# Patient Record
Sex: Male | Born: 1985 | Race: White | Hispanic: No | Marital: Single | Smoking: Former smoker
Health system: Southern US, Community
[De-identification: ages and names within clinical notes are randomized; demographics above are authoritative.]

## PROBLEM LIST (undated history)

## (undated) DIAGNOSIS — Y249XXA Unspecified firearm discharge, undetermined intent, initial encounter: Secondary | ICD-10-CM

## (undated) DIAGNOSIS — K5780 Diverticulitis of intestine, part unspecified, with perforation and abscess without bleeding: Secondary | ICD-10-CM

## (undated) DIAGNOSIS — I10 Essential (primary) hypertension: Secondary | ICD-10-CM

## (undated) DIAGNOSIS — K56609 Unspecified intestinal obstruction, unspecified as to partial versus complete obstruction: Secondary | ICD-10-CM

## (undated) DIAGNOSIS — K5792 Diverticulitis of intestine, part unspecified, without perforation or abscess without bleeding: Secondary | ICD-10-CM

## (undated) DIAGNOSIS — W3400XA Accidental discharge from unspecified firearms or gun, initial encounter: Secondary | ICD-10-CM

## (undated) DIAGNOSIS — K5720 Diverticulitis of large intestine with perforation and abscess without bleeding: Secondary | ICD-10-CM

## (undated) HISTORY — DX: Unspecified intestinal obstruction, unspecified as to partial versus complete obstruction: K56.609

## (undated) HISTORY — DX: Diverticulitis of intestine, part unspecified, with perforation and abscess without bleeding: K57.80

## (undated) HISTORY — DX: Diverticulitis of intestine, part unspecified, without perforation or abscess without bleeding: K57.92

## (undated) HISTORY — PX: COLON SURGERY: SHX602

---

## 2012-06-07 ENCOUNTER — Emergency Department (HOSPITAL_BASED_OUTPATIENT_CLINIC_OR_DEPARTMENT_OTHER)
Admission: EM | Admit: 2012-06-07 | Discharge: 2012-06-07 | Disposition: A | Discharge status: Home / Self Care | Payer: Self-pay | Attending: Emergency Medicine | Admitting: Emergency Medicine

## 2012-06-07 ENCOUNTER — Emergency Department (HOSPITAL_BASED_OUTPATIENT_CLINIC_OR_DEPARTMENT_OTHER): Payer: Self-pay

## 2012-06-07 ENCOUNTER — Encounter (HOSPITAL_BASED_OUTPATIENT_CLINIC_OR_DEPARTMENT_OTHER): Payer: Self-pay | Admitting: *Deleted

## 2012-06-07 DIAGNOSIS — D179 Benign lipomatous neoplasm, unspecified: Secondary | ICD-10-CM

## 2012-06-07 DIAGNOSIS — D1739 Benign lipomatous neoplasm of skin and subcutaneous tissue of other sites: Secondary | ICD-10-CM | POA: Insufficient documentation

## 2012-06-07 DIAGNOSIS — F17200 Nicotine dependence, unspecified, uncomplicated: Secondary | ICD-10-CM | POA: Insufficient documentation

## 2012-06-07 MED ORDER — HYDROCODONE-ACETAMINOPHEN 5-325 MG PO TABS: 1.0000 | ORAL_TABLET | ORAL | Status: DC | PRN

## 2012-06-07 NOTE — ED Provider Notes (Signed)
History     CSN: [id number]  Arrival date & time 06/07/12  1143   First MD Initiated Contact with Patient 06/07/12 1318      Chief Complaint  Patient presents with  . Mass    (Consider location/radiation/quality/duration/timing/severity/associated sxs/prior treatment) HPI Comments: Pt state that he has a bump in his right upper leg and he has had it for 4 years:but states that he has been having pain with the area and it is more sore with standing:pt denies increase in size or redness to the area:pt states that tylenol doesn't work:pt states that it hurts in his hip  The history is provided by the patient. No language interpreter was used.    History reviewed. No pertinent past medical history.  History reviewed. No pertinent past surgical history.  History reviewed. No pertinent family history.  History  Substance Use Topics  . Smoking status: Current Every Day Smoker  . Smokeless tobacco: Not on file  . Alcohol Use: Not on file      Review of Systems  Constitutional: Negative.   Respiratory: Negative.   Cardiovascular: Negative.     Allergies  Review of patient's allergies indicates no known allergies.  Home Medications   Current Outpatient Rx  Name  Route  Sig  Dispense  Refill  . HYDROcodone-acetaminophen (NORCO/VICODIN) 5-325 MG per tablet   Oral   Take 1 tablet by mouth every 4 (four) hours as needed for pain.   10 tablet   0     BP 153/87  Pulse 92  Temp(Src) 98.4 F (36.9 C) (Oral)  Resp 16  Ht 6' (1.829 m)  Wt 160 lb (72.576 kg)  BMI 21.7 kg/m2  SpO2 100%  Physical Exam  Nursing note and vitals reviewed. Constitutional: He is oriented to person, place, and time. He appears well-developed and well-nourished.  Cardiovascular: Normal rate and regular rhythm.   Pulmonary/Chest: Effort normal and breath sounds normal.  Neurological: He is alert and oriented to person, place, and time.  Skin:  Pt has fluctuant area to the right upper leg:no  redness noted to the area:pt has full rom    ED Course  Procedures (including critical care time)  Labs Reviewed - No data to display Dg Hip Complete Right  06/07/2012  *RADIOLOGY REPORT*  Clinical Data: Soft tissue fullness right groin region  RIGHT HIP - COMPLETE 2+ VIEW  Comparison: None.  Findings:  Frontal pelvis as well as frontal and lateral right hip images were obtained.  There is a sclerotic area in the medial intertrochanteric region measuring 3.4 x 2.0 cm, probably a bone island.  No fracture or dislocation.  Joint spaces appear intact. No erosive change. No bony exostosis or abnormal periosteal reaction.  No soft tissue mass seen.  IMPRESSION: Probable bone island proximal femur on the right.  No fracture or dislocation.  No evidence of soft tissue mass or abnormal periosteal reaction.  If there remains concern for mass in this area, CT or MR could be helpful to further assess.   Original Report Authenticated By: Bretta Bang, M.D.      1. Lipoma       MDM  Fluctuant without redness:no along the lymph chain       Teressa Lower, NP 06/07/12 1755

## 2012-06-07 NOTE — ED Notes (Signed)
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

## 2012-06-08 NOTE — ED Provider Notes (Signed)
Medical screening examination/treatment/procedure(s) were performed by non-physician practitioner and as supervising physician I was immediately available for consultation/collaboration.   Charles B. Bernette Mayers, MD 06/08/12 [phone]

## 2012-06-08 NOTE — ED Notes (Signed)
Pt called stating [facility] will not remove lipoma. Pt was given phone number to [facility]. Pt requesting additional pain med and told no refills can be called in for narcotics.

## 2013-06-01 ENCOUNTER — Emergency Department (HOSPITAL_BASED_OUTPATIENT_CLINIC_OR_DEPARTMENT_OTHER): Payer: Self-pay

## 2013-06-01 ENCOUNTER — Encounter (HOSPITAL_BASED_OUTPATIENT_CLINIC_OR_DEPARTMENT_OTHER): Payer: Self-pay | Admitting: Emergency Medicine

## 2013-06-01 ENCOUNTER — Emergency Department (HOSPITAL_BASED_OUTPATIENT_CLINIC_OR_DEPARTMENT_OTHER)
Admission: EM | Admit: 2013-06-01 | Discharge: 2013-06-01 | Disposition: A | Discharge status: Home / Self Care | Payer: Self-pay | Attending: Emergency Medicine | Admitting: Emergency Medicine

## 2013-06-01 DIAGNOSIS — Y92410 Unspecified street and highway as the place of occurrence of the external cause: Secondary | ICD-10-CM | POA: Insufficient documentation

## 2013-06-01 DIAGNOSIS — IMO0002 Reserved for concepts with insufficient information to code with codable children: Secondary | ICD-10-CM | POA: Insufficient documentation

## 2013-06-01 DIAGNOSIS — S5002XA Contusion of left elbow, initial encounter: Secondary | ICD-10-CM

## 2013-06-01 DIAGNOSIS — Z8719 Personal history of other diseases of the digestive system: Secondary | ICD-10-CM | POA: Insufficient documentation

## 2013-06-01 DIAGNOSIS — F17200 Nicotine dependence, unspecified, uncomplicated: Secondary | ICD-10-CM | POA: Insufficient documentation

## 2013-06-01 DIAGNOSIS — S5000XA Contusion of unspecified elbow, initial encounter: Secondary | ICD-10-CM | POA: Insufficient documentation

## 2013-06-01 DIAGNOSIS — Y9389 Activity, other specified: Secondary | ICD-10-CM | POA: Insufficient documentation

## 2013-06-01 HISTORY — DX: Unspecified firearm discharge, undetermined intent, initial encounter: Y24.9XXA

## 2013-06-01 HISTORY — DX: Accidental discharge from unspecified firearms or gun, initial encounter: W34.00XA

## 2013-06-01 HISTORY — DX: Diverticulitis of large intestine with perforation and abscess without bleeding: K57.20

## 2013-06-01 MED ORDER — HYDROCODONE-ACETAMINOPHEN 5-325 MG PO TABS
2.0000 | ORAL_TABLET | Freq: Once | ORAL | Status: AC
  Administered 2013-06-01: 2 via ORAL
  Filled 2013-06-01: qty 2

## 2013-06-01 MED ORDER — HYDROCODONE-ACETAMINOPHEN 5-325 MG PO TABS: 2.0000 | ORAL_TABLET | ORAL | Status: DC | PRN
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
## 2013-06-01 NOTE — ED Provider Notes (Signed)
Medical screening examination/treatment/procedure(s) were performed by non-physician practitioner and as supervising physician I was immediately available for consultation/collaboration.   EKG Interpretation None        Khayla Koppenhaver, MD 06/01/13 2345 

## 2013-06-01 NOTE — Discharge Instructions (Signed)
Elbow Contusion °An elbow contusion is a deep bruise of the elbow. Contusions are the result of an injury that caused bleeding under the skin. The contusion may turn blue, purple, or yellow. Minor injuries will give you a painless contusion, but more severe contusions may stay painful and swollen for a few weeks.  °CAUSES  °An elbow contusion comes from a direct force to that area, such as falling on the elbow. °SYMPTOMS  °· Swelling and redness of the elbow. °· Bruising of the elbow area. °· Tenderness or soreness of the elbow. °DIAGNOSIS  °You will have a physical exam and will be asked about your history. You may need an X-ray of your elbow to look for a broken bone (fracture).  °TREATMENT  °A sling or splint may be needed to support your injury. Resting, elevating, and applying cold compresses to the elbow area are often the best treatments for an elbow contusion. Over-the-counter medicines may also be recommended for pain control. °HOME CARE INSTRUCTIONS  °· Put ice on the injured area. °· Put ice in a plastic bag. °· Place a towel between your skin and the bag. °· Leave the ice on for 15-20 minutes, 03-04 times a day. °· Only take over-the-counter or prescription medicines for pain, discomfort, or fever as directed by your caregiver. °· Rest your injured elbow until the pain and swelling are better. °· Elevate your elbow to reduce swelling. °· Apply a compression wrap as directed by your caregiver. This can help reduce swelling and motion. You may remove the wrap for sleeping, showers, and baths. If your fingers become numb, cold, or blue, take the wrap off and reapply it more loosely. °· Use your elbow only as directed by your caregiver. You may be asked to do range of motion exercises. Do them as directed. °· See your caregiver as directed. It is very important to keep all follow-up appointments in order to avoid any long-term problems with your elbow, including chronic pain or inability to move your elbow  normally. °SEEK IMMEDIATE MEDICAL CARE IF:  °· You have increased redness, swelling, or pain in your elbow. °· Your swelling or pain is not relieved with medicines. °· You have swelling of the hand and fingers. °· You are unable to move your fingers or wrist. °· You begin to lose feeling in your hand or fingers. °· Your fingers or hand become cold or blue. °MAKE SURE YOU:  °· Understand these instructions. °· Will watch your condition. °· Will get help right away if you are not doing well or get worse. °Document Released: 01/09/2006 Document Revised: 04/25/2011 Document Reviewed: 12/17/2010 °Exit[employer]® Patient Information ©2014 [employer]. ° °

## 2014-05-26 ENCOUNTER — Emergency Department
Admit: 2014-05-26 | Disposition: A | Discharge status: Home / Self Care | Payer: Self-pay | Admitting: Emergency Medicine

## 2014-06-13 ENCOUNTER — Emergency Department
Admit: 2014-06-13 | Disposition: A | Discharge status: Home / Self Care | Payer: Self-pay | Admitting: Emergency Medicine

## 2014-06-13 LAB — COMPREHENSIVE METABOLIC PANEL
ALT: 21 U/L
ANION GAP: 9 (ref 7–16)
Albumin: 4.6 g/dL
Alkaline Phosphatase: 56 U/L
BUN: 10 mg/dL
Bilirubin,Total: 0.4 mg/dL
CALCIUM: 8.4 mg/dL — AB
CO2: 24 mmol/L
Chloride: 105 mmol/L
Creatinine: 0.73 mg/dL
EGFR (African American): >60
EGFR (Non-African Amer.): >60
Glucose: 91 mg/dL
POTASSIUM: 3.7 mmol/L
SGOT(AST): 42 U/L — ABNORMAL HIGH
SODIUM: 138 mmol/L
Total Protein: 7.8 g/dL

## 2014-06-13 LAB — DRUG SCREEN, URINE
AMPHETAMINES, UR SCREEN: NEGATIVE
BARBITURATES, UR SCREEN: NEGATIVE
BENZODIAZEPINE, UR SCRN: NEGATIVE
Cannabinoid 50 Ng, Ur ~~LOC~~: POSITIVE
Cocaine Metabolite,Ur ~~LOC~~: NEGATIVE
MDMA (Ecstasy)Ur Screen: NEGATIVE
METHADONE, UR SCREEN: NEGATIVE
Opiate, Ur Screen: POSITIVE
PHENCYCLIDINE (PCP) UR S: NEGATIVE
TRICYCLIC, UR SCREEN: NEGATIVE

## 2014-06-13 LAB — URINALYSIS, COMPLETE
Bacteria: NONE SEEN
Bilirubin,UR: NEGATIVE
Blood: NEGATIVE
Glucose,UR: 150 mg/dL (ref 0–75)
Ketone: NEGATIVE
LEUKOCYTE ESTERASE: NEGATIVE
NITRITE: NEGATIVE
PH: 6 (ref 4.5–8.0)
Protein: 30
Specific Gravity: 1.006 (ref 1.003–1.030)
Squamous Epithelial: NONE SEEN

## 2014-06-13 LAB — CBC
HCT: 42.1 % (ref 40.0–52.0)
HGB: 14.4 g/dL (ref 13.0–18.0)
MCH: 30.7 pg (ref 26.0–34.0)
MCHC: 34.2 g/dL (ref 32.0–36.0)
MCV: 90 fL (ref 80–100)
Platelet: 302 10*3/uL (ref 150–440)
RBC: 4.69 10*6/uL (ref 4.40–5.90)
RDW: 12.7 % (ref 11.5–14.5)
WBC: 13.7 10*3/uL — AB (ref 3.8–10.6)

## 2014-06-13 LAB — SALICYLATE LEVEL: Salicylates, Serum: <4 mg/dL

## 2014-06-13 LAB — TSH: THYROID STIMULATING HORM: 3.786 u[IU]/mL

## 2014-06-13 LAB — ACETAMINOPHEN LEVEL: Acetaminophen: <10 ug/mL

## 2014-06-13 LAB — ETHANOL: Ethanol: 83 mg/dL

## 2017-02-16 ENCOUNTER — Inpatient Hospital Stay
Admission: EM | Admit: 2017-02-16 | Discharge: 2017-02-16 | DRG: 392 | Disposition: A | Discharge status: Home / Self Care | Payer: Medicaid Other | Attending: Specialist | Admitting: Specialist

## 2017-02-16 ENCOUNTER — Emergency Department: Payer: Medicaid Other

## 2017-02-16 ENCOUNTER — Other Ambulatory Visit: Payer: Self-pay

## 2017-02-16 DIAGNOSIS — N179 Acute kidney failure, unspecified: Secondary | ICD-10-CM | POA: Diagnosis present

## 2017-02-16 DIAGNOSIS — E860 Dehydration: Secondary | ICD-10-CM | POA: Diagnosis present

## 2017-02-16 DIAGNOSIS — Z79899 Other long term (current) drug therapy: Secondary | ICD-10-CM | POA: Diagnosis not present

## 2017-02-16 DIAGNOSIS — K5792 Diverticulitis of intestine, part unspecified, without perforation or abscess without bleeding: Secondary | ICD-10-CM | POA: Diagnosis not present

## 2017-02-16 DIAGNOSIS — Z885 Allergy status to narcotic agent status: Secondary | ICD-10-CM

## 2017-02-16 DIAGNOSIS — D72829 Elevated white blood cell count, unspecified: Secondary | ICD-10-CM | POA: Diagnosis present

## 2017-02-16 DIAGNOSIS — R52 Pain, unspecified: Secondary | ICD-10-CM

## 2017-02-16 DIAGNOSIS — F17210 Nicotine dependence, cigarettes, uncomplicated: Secondary | ICD-10-CM | POA: Diagnosis present

## 2017-02-16 LAB — URINALYSIS, COMPLETE (UACMP) WITH MICROSCOPIC
Bacteria, UA: NONE SEEN
Bilirubin Urine: NEGATIVE
GLUCOSE, UA: 150 mg/dL — AB
Ketones, ur: NEGATIVE mg/dL
NITRITE: NEGATIVE
PH: 5 (ref 5.0–8.0)
Protein, ur: 100 mg/dL — AB
Specific Gravity, Urine: 1.026 (ref 1.005–1.030)

## 2017-02-16 LAB — COMPREHENSIVE METABOLIC PANEL
ALBUMIN: 4.8 g/dL (ref 3.5–5.0)
ALK PHOS: 54 U/L (ref 38–126)
ALT: 22 U/L (ref 17–63)
ANION GAP: 10 (ref 5–15)
AST: 20 U/L (ref 15–41)
BILIRUBIN TOTAL: 0.9 mg/dL (ref 0.3–1.2)
BUN: 15 mg/dL (ref 6–20)
CALCIUM: 9.8 mg/dL (ref 8.9–10.3)
CO2: 24 mmol/L (ref 22–32)
Chloride: 99 mmol/L — ABNORMAL LOW (ref 101–111)
Creatinine, Ser: 1.27 mg/dL — ABNORMAL HIGH (ref 0.61–1.24)
GFR calc Af Amer: >60 mL/min (ref >60)
GLUCOSE: 191 mg/dL — AB (ref 65–99)
POTASSIUM: 4.4 mmol/L (ref 3.5–5.1)
Sodium: 133 mmol/L — ABNORMAL LOW (ref 135–145)
TOTAL PROTEIN: 9 g/dL — AB (ref 6.5–8.1)

## 2017-02-16 LAB — CBC
HCT: 48.6 % (ref 40.0–52.0)
HEMOGLOBIN: 16.5 g/dL (ref 13.0–18.0)
MCH: 31.3 pg (ref 26.0–34.0)
MCHC: 34 g/dL (ref 32.0–36.0)
MCV: 91.9 fL (ref 80.0–100.0)
Platelets: 303 10*3/uL (ref 150–440)
RBC: 5.29 MIL/uL (ref 4.40–5.90)
RDW: 13.2 % (ref 11.5–14.5)
WBC: 24 10*3/uL — AB (ref 3.8–10.6)

## 2017-02-16 LAB — LIPASE, BLOOD: Lipase: 25 U/L (ref 11–51)

## 2017-02-16 MED ORDER — CIPROFLOXACIN IN D5W 400 MG/200ML IV SOLN
400.0000 mg | Freq: Once | INTRAVENOUS | Status: AC
  Administered 2017-02-16: 400 mg via INTRAVENOUS
  Filled 2017-02-16: qty 200

## 2017-02-16 MED ORDER — OXYCODONE-ACETAMINOPHEN 5-325 MG PO TABS
1.0000 | ORAL_TABLET | ORAL | Status: DC | PRN
  Administered 2017-02-16: 1 via ORAL
  Filled 2017-02-16: qty 1

## 2017-02-16 MED ORDER — ONDANSETRON HCL 4 MG/2ML IJ SOLN
4.0000 mg | Freq: Once | INTRAMUSCULAR | Status: AC
  Administered 2017-02-16: 4 mg via INTRAVENOUS
  Filled 2017-02-16: qty 2

## 2017-02-16 MED ORDER — IOPAMIDOL (ISOVUE-300) INJECTION 61%
100.0000 mL | Freq: Once | INTRAVENOUS | Status: AC | PRN
  Administered 2017-02-16: 100 mL via INTRAVENOUS
  Filled 2017-02-16: qty 100

## 2017-02-16 MED ORDER — METRONIDAZOLE IN NACL 5-0.79 MG/ML-% IV SOLN
500.0000 mg | Freq: Once | INTRAVENOUS | Status: AC
  Administered 2017-02-16: 500 mg via INTRAVENOUS
  Filled 2017-02-16: qty 100

## 2017-02-16 MED ORDER — OXYCODONE-ACETAMINOPHEN 5-325 MG PO TABS: 1.0000 | ORAL_TABLET | ORAL | 0 refills | Status: DC | PRN

## 2017-02-16 MED ORDER — CIPROFLOXACIN HCL 500 MG PO TABS: 500.0000 mg | ORAL_TABLET | Freq: Two times a day (BID) | ORAL | 0 refills | Status: DC

## 2017-02-16 MED ORDER — MORPHINE SULFATE (PF) 4 MG/ML IV SOLN
4.0000 mg | INTRAVENOUS | Status: AC | PRN
  Administered 2017-02-16 (×2): 4 mg via INTRAVENOUS
  Filled 2017-02-16 (×2): qty 1

## 2017-02-16 MED ORDER — CIPROFLOXACIN IN D5W 400 MG/200ML IV SOLN
INTRAVENOUS | Status: AC
  Filled 2017-02-16: qty 200

## 2017-02-16 MED ORDER — METRONIDAZOLE IN NACL 5-0.79 MG/ML-% IV SOLN
INTRAVENOUS | Status: AC
  Filled 2017-02-16: qty 100

## 2017-02-16 MED ORDER — SODIUM CHLORIDE 0.9 % IV BOLUS (SEPSIS)
1000.0000 mL | Freq: Once | INTRAVENOUS | Status: AC
  Administered 2017-02-16: 1000 mL via INTRAVENOUS

## 2017-02-16 MED ORDER — METRONIDAZOLE 500 MG PO TABS: 500.0000 mg | ORAL_TABLET | Freq: Two times a day (BID) | ORAL | 0 refills | Status: DC

## 2017-02-16 MED ORDER — IOPAMIDOL (ISOVUE-300) INJECTION 61%
30.0000 mL | Freq: Once | INTRAVENOUS | Status: AC | PRN
  Administered 2017-02-16: 30 mL via ORAL
  Filled 2017-02-16: qty 30

## 2017-02-16 NOTE — ED Triage Notes (Signed)
Pt states lower abdominal pain all the way across since yesterday. Also c/o R sided groin pain. States sharp. States hx of kidney stones. States pain worse when using the bathroom. Nausea last night. Denies vomiting. States diarrhea x 2-3 episodes. Still has appendix and gallbladder. Alert, oriented, ambulatory.
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
## 2017-02-16 NOTE — Discharge Instructions (Signed)
You are evaluated for abdominal pain and found to have diverticulitis which is being treated with antibiotics.  You are also prescribed narcotic pain medication, you may also take over-the-counter ibuprofen as needed for mild to moderate pain.  Do not mix narcotics with any other sedative medications or substances including alcohol.  Return to emergency department immediately for any worsening condition including uncontrolled pain, fever, nausea and vomiting and unable to keep her medications down, or any other symptoms concerning to you.  I do recommend you follow-up with primary care doctor, and [facility] clinic number is provided.

## 2017-02-16 NOTE — ED Notes (Signed)
Pt calling out, states pain medication did not help, asking for more.  EDP notified.
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
## 2017-02-16 NOTE — ED Notes (Signed)
Patient transported to CT 

## 2017-02-18 ENCOUNTER — Encounter: Payer: Self-pay | Admitting: Emergency Medicine

## 2017-02-18 ENCOUNTER — Other Ambulatory Visit: Payer: Self-pay

## 2017-02-18 ENCOUNTER — Emergency Department: Payer: Medicaid Other

## 2017-02-18 ENCOUNTER — Inpatient Hospital Stay
Admission: EM | Admit: 2017-02-18 | Discharge: 2017-02-24 | DRG: 392 | Disposition: A | Discharge status: Home / Self Care | Payer: Medicaid Other | Attending: Surgery | Admitting: Surgery

## 2017-02-18 DIAGNOSIS — F1010 Alcohol abuse, uncomplicated: Secondary | ICD-10-CM | POA: Diagnosis present

## 2017-02-18 DIAGNOSIS — K5780 Diverticulitis of intestine, part unspecified, with perforation and abscess without bleeding: Secondary | ICD-10-CM

## 2017-02-18 DIAGNOSIS — R109 Unspecified abdominal pain: Secondary | ICD-10-CM

## 2017-02-18 DIAGNOSIS — Z885 Allergy status to narcotic agent status: Secondary | ICD-10-CM | POA: Diagnosis not present

## 2017-02-18 DIAGNOSIS — K56609 Unspecified intestinal obstruction, unspecified as to partial versus complete obstruction: Secondary | ICD-10-CM

## 2017-02-18 DIAGNOSIS — K5792 Diverticulitis of intestine, part unspecified, without perforation or abscess without bleeding: Secondary | ICD-10-CM

## 2017-02-18 DIAGNOSIS — K5732 Diverticulitis of large intestine without perforation or abscess without bleeding: Secondary | ICD-10-CM

## 2017-02-18 DIAGNOSIS — F17210 Nicotine dependence, cigarettes, uncomplicated: Secondary | ICD-10-CM | POA: Diagnosis present

## 2017-02-18 DIAGNOSIS — K5740 Diverticulitis of both small and large intestine with perforation and abscess without bleeding: Secondary | ICD-10-CM | POA: Diagnosis present

## 2017-02-18 DIAGNOSIS — K5720 Diverticulitis of large intestine with perforation and abscess without bleeding: Secondary | ICD-10-CM

## 2017-02-18 HISTORY — DX: Diverticulitis of intestine, part unspecified, without perforation or abscess without bleeding: K57.92

## 2017-02-18 LAB — URINALYSIS, COMPLETE (UACMP) WITH MICROSCOPIC
Bacteria, UA: NONE SEEN
Bilirubin Urine: NEGATIVE
GLUCOSE, UA: NEGATIVE mg/dL
HGB URINE DIPSTICK: NEGATIVE
Ketones, ur: 20 mg/dL — AB
Nitrite: NEGATIVE
PROTEIN: 30 mg/dL — AB
Specific Gravity, Urine: 1.021 (ref 1.005–1.030)
Squamous Epithelial / LPF: NONE SEEN
pH: 5 (ref 5.0–8.0)

## 2017-02-18 LAB — COMPREHENSIVE METABOLIC PANEL
ALK PHOS: 56 U/L (ref 38–126)
ALT: 13 U/L — ABNORMAL LOW (ref 17–63)
ANION GAP: 11 (ref 5–15)
AST: 12 U/L — ABNORMAL LOW (ref 15–41)
Albumin: 4.2 g/dL (ref 3.5–5.0)
BUN: 13 mg/dL (ref 6–20)
CALCIUM: 10 mg/dL (ref 8.9–10.3)
CO2: 28 mmol/L (ref 22–32)
Chloride: 94 mmol/L — ABNORMAL LOW (ref 101–111)
Creatinine, Ser: 0.9 mg/dL (ref 0.61–1.24)
GFR calc non Af Amer: >60 mL/min (ref >60)
Glucose, Bld: 147 mg/dL — ABNORMAL HIGH (ref 65–99)
POTASSIUM: 3.9 mmol/L (ref 3.5–5.1)
SODIUM: 133 mmol/L — AB (ref 135–145)
Total Bilirubin: 0.9 mg/dL (ref 0.3–1.2)
Total Protein: 8.8 g/dL — ABNORMAL HIGH (ref 6.5–8.1)

## 2017-02-18 LAB — CBC
HEMATOCRIT: 49.7 % (ref 40.0–52.0)
HEMOGLOBIN: 17 g/dL (ref 13.0–18.0)
MCH: 31 pg (ref 26.0–34.0)
MCHC: 34.3 g/dL (ref 32.0–36.0)
MCV: 90.4 fL (ref 80.0–100.0)
Platelets: 379 10*3/uL (ref 150–440)
RBC: 5.49 MIL/uL (ref 4.40–5.90)
RDW: 13.2 % (ref 11.5–14.5)
WBC: 13.4 10*3/uL — ABNORMAL HIGH (ref 3.8–10.6)

## 2017-02-18 LAB — LIPASE, BLOOD: LIPASE: 23 U/L (ref 11–51)

## 2017-02-18 MED ORDER — FOLIC ACID 1 MG PO TABS
1.0000 mg | ORAL_TABLET | Freq: Every day | ORAL | Status: DC
  Administered 2017-02-19 – 2017-02-24 (×5): 1 mg via ORAL
  Filled 2017-02-18 (×5): qty 1

## 2017-02-18 MED ORDER — OXYCODONE-ACETAMINOPHEN 5-325 MG PO TABS
ORAL_TABLET | ORAL | Status: AC
  Filled 2017-02-18: qty 1

## 2017-02-18 MED ORDER — VITAMIN B-1 100 MG PO TABS
100.0000 mg | ORAL_TABLET | Freq: Every day | ORAL | Status: DC
  Administered 2017-02-19 – 2017-02-24 (×5): 100 mg via ORAL
  Filled 2017-02-18 (×5): qty 1

## 2017-02-18 MED ORDER — SODIUM CHLORIDE 0.9 % IV SOLN
1000.0000 mL | Freq: Once | INTRAVENOUS | Status: AC
  Administered 2017-02-18: 1000 mL via INTRAVENOUS

## 2017-02-18 MED ORDER — OXYCODONE-ACETAMINOPHEN 5-325 MG PO TABS
1.0000 | ORAL_TABLET | ORAL | Status: DC | PRN
  Administered 2017-02-18: 1 via ORAL

## 2017-02-18 MED ORDER — ADULT MULTIVITAMIN W/MINERALS CH
1.0000 | ORAL_TABLET | Freq: Every day | ORAL | Status: DC
  Administered 2017-02-19 – 2017-02-24 (×5): 1 via ORAL
  Filled 2017-02-18 (×5): qty 1

## 2017-02-18 MED ORDER — HEPARIN SODIUM (PORCINE) 5000 UNIT/ML IJ SOLN
5000.0000 [IU] | Freq: Three times a day (TID) | INTRAMUSCULAR | Status: DC
  Administered 2017-02-19 – 2017-02-21 (×8): 5000 [IU] via SUBCUTANEOUS
  Filled 2017-02-18 (×8): qty 1

## 2017-02-18 MED ORDER — PIPERACILLIN-TAZOBACTAM 3.375 G IVPB
3.3750 g | Freq: Three times a day (TID) | INTRAVENOUS | Status: DC
  Administered 2017-02-19 – 2017-02-24 (×17): 3.375 g via INTRAVENOUS
  Filled 2017-02-18 (×17): qty 50

## 2017-02-18 MED ORDER — MORPHINE SULFATE (PF) 4 MG/ML IV SOLN
4.0000 mg | Freq: Once | INTRAVENOUS | Status: AC
  Administered 2017-02-18: 4 mg via INTRAVENOUS

## 2017-02-18 MED ORDER — HYDROMORPHONE HCL 1 MG/ML IJ SOLN
INTRAMUSCULAR | Status: AC
  Administered 2017-02-18: 1 mg via INTRAVENOUS
  Filled 2017-02-18: qty 1

## 2017-02-18 MED ORDER — MORPHINE SULFATE (PF) 4 MG/ML IV SOLN
INTRAVENOUS | Status: AC
  Administered 2017-02-18: 4 mg via INTRAVENOUS
  Filled 2017-02-18: qty 1

## 2017-02-18 MED ORDER — IOPAMIDOL (ISOVUE-300) INJECTION 61%
30.0000 mL | Freq: Once | INTRAVENOUS | Status: AC | PRN
  Administered 2017-02-18: 30 mL via ORAL

## 2017-02-18 MED ORDER — LACTATED RINGERS IV BOLUS (SEPSIS)
1000.0000 mL | Freq: Once | INTRAVENOUS | Status: AC
  Administered 2017-02-19: 1000 mL via INTRAVENOUS
  Filled 2017-02-18: qty 1000

## 2017-02-18 MED ORDER — MORPHINE SULFATE (PF) 4 MG/ML IV SOLN
4.0000 mg | INTRAVENOUS | Status: DC | PRN
Start: 2017-02-18 — End: 2017-02-24
  Administered 2017-02-19 – 2017-02-23 (×34): 4 mg via INTRAVENOUS
  Filled 2017-02-18 (×37): qty 1

## 2017-02-18 MED ORDER — LORAZEPAM 2 MG/ML IJ SOLN: 1.0000 mg | Freq: Four times a day (QID) | INTRAMUSCULAR | Status: AC | PRN

## 2017-02-18 MED ORDER — PIPERACILLIN-TAZOBACTAM 3.375 G IVPB 30 MIN: 3.3750 g | Freq: Three times a day (TID) | INTRAVENOUS | Status: DC

## 2017-02-18 MED ORDER — ONDANSETRON HCL 4 MG/2ML IJ SOLN
4.0000 mg | Freq: Once | INTRAMUSCULAR | Status: AC
  Administered 2017-02-18: 4 mg via INTRAVENOUS
  Filled 2017-02-18: qty 2

## 2017-02-18 MED ORDER — LORAZEPAM 1 MG PO TABS: 1.0000 mg | ORAL_TABLET | Freq: Four times a day (QID) | ORAL | Status: AC | PRN

## 2017-02-18 MED ORDER — MORPHINE SULFATE (PF) 4 MG/ML IV SOLN
4.0000 mg | Freq: Once | INTRAVENOUS | Status: AC
  Administered 2017-02-18: 4 mg via INTRAVENOUS
  Filled 2017-02-18: qty 1

## 2017-02-18 MED ORDER — IOPAMIDOL (ISOVUE-300) INJECTION 61%
100.0000 mL | Freq: Once | INTRAVENOUS | Status: AC | PRN
  Administered 2017-02-18: 100 mL via INTRAVENOUS

## 2017-02-18 MED ORDER — ONDANSETRON HCL 4 MG/2ML IJ SOLN
4.0000 mg | Freq: Four times a day (QID) | INTRAMUSCULAR | Status: DC | PRN
  Administered 2017-02-19 (×2): 4 mg via INTRAVENOUS
  Filled 2017-02-18 (×2): qty 2

## 2017-02-18 MED ORDER — ONDANSETRON HCL 4 MG PO TABS: 4.0000 mg | ORAL_TABLET | Freq: Four times a day (QID) | ORAL | Status: DC | PRN

## 2017-02-18 MED ORDER — THIAMINE HCL 100 MG/ML IJ SOLN
100.0000 mg | Freq: Every day | INTRAMUSCULAR | Status: DC
  Administered 2017-02-21: 100 mg via INTRAVENOUS
  Filled 2017-02-18 (×2): qty 2

## 2017-02-18 MED ORDER — PIPERACILLIN-TAZOBACTAM 3.375 G IVPB 30 MIN
3.3750 g | Freq: Once | INTRAVENOUS | Status: AC
  Administered 2017-02-18: 3.375 g via INTRAVENOUS
  Filled 2017-02-18: qty 50

## 2017-02-18 MED ORDER — DEXTROSE IN LACTATED RINGERS 5 % IV SOLN
INTRAVENOUS | Status: DC
  Administered 2017-02-19 – 2017-02-24 (×13): via INTRAVENOUS
  Filled 2017-02-18 (×3): qty 1000

## 2017-02-18 MED ORDER — HYDROMORPHONE HCL 1 MG/ML IJ SOLN
1.0000 mg | Freq: Once | INTRAMUSCULAR | Status: AC
  Administered 2017-02-18: 1 mg via INTRAVENOUS
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
## 2017-02-18 NOTE — ED Triage Notes (Signed)
Pt to ED c/o abd pain, n/v since Thursday when he was seen and dx with diverticulitis.  States now having bloating, pain is still the same.  States vomit is red, skin color WNL, chest rise even and unlabored.

## 2017-02-18 NOTE — ED Notes (Signed)
Patient transported to 212 
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
## 2017-02-18 NOTE — ED Triage Notes (Signed)
FIRST NURSE NOTE-dx with diverticulosis Thursday. Vomited today and last little bit has some blood. Color WNL. Ambulatory.

## 2017-02-18 NOTE — ED Notes (Signed)
General Surgeon Dr. Burt Knack at bedside.

## 2017-02-18 NOTE — ED Notes (Signed)
Family reports not feeling well. Offered to recheck vitals and explained wait.

## 2017-02-19 DIAGNOSIS — K5732 Diverticulitis of large intestine without perforation or abscess without bleeding: Secondary | ICD-10-CM

## 2017-02-19 LAB — CBC
HEMATOCRIT: 41.3 % (ref 40.0–52.0)
Hemoglobin: 14.2 g/dL (ref 13.0–18.0)
MCH: 31.3 pg (ref 26.0–34.0)
MCHC: 34.4 g/dL (ref 32.0–36.0)
MCV: 91 fL (ref 80.0–100.0)
PLATELETS: 308 10*3/uL (ref 150–440)
RBC: 4.54 MIL/uL (ref 4.40–5.90)
RDW: 13.3 % (ref 11.5–14.5)
WBC: 10.7 10*3/uL — AB (ref 3.8–10.6)

## 2017-02-19 LAB — BASIC METABOLIC PANEL
Anion gap: 10 (ref 5–15)
BUN: 11 mg/dL (ref 6–20)
CO2: 27 mmol/L (ref 22–32)
Calcium: 8.9 mg/dL (ref 8.9–10.3)
Chloride: 98 mmol/L — ABNORMAL LOW (ref 101–111)
Creatinine, Ser: 0.79 mg/dL (ref 0.61–1.24)
GFR calc Af Amer: >60 mL/min (ref >60)
GFR calc non Af Amer: >60 mL/min (ref >60)
Glucose, Bld: 142 mg/dL — ABNORMAL HIGH (ref 65–99)
POTASSIUM: 3.5 mmol/L (ref 3.5–5.1)
SODIUM: 135 mmol/L (ref 135–145)
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
## 2017-02-20 ENCOUNTER — Inpatient Hospital Stay: Payer: Medicaid Other

## 2017-02-20 MED ORDER — INFLUENZA VAC SPLIT QUAD 0.5 ML IM SUSY
0.5000 mL | PREFILLED_SYRINGE | INTRAMUSCULAR | Status: DC
  Filled 2017-02-20: qty 0.5
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
## 2017-02-20 NOTE — Progress Notes (Signed)
CC: Diverticulitis Subjective: Patient reports that his left lower quadrant abdominal pain is almost completely resolved.  He does state he continues to feel bloated and has some mid to upper abdominal discomfort as well.  He has no appetite currently.  He has been passing flatus.  Objective: Vital signs in last 24 hours: Temp:  [97.9 F (36.6 C)-98.3 F (36.8 C)] 98.3 F (36.8 C) (01/07 0554) Pulse Rate:  [90-100] 98 (01/07 0554) Resp:  [17-18] 18 (01/07 0554) BP: (139-154)/(88-103) 148/102 (01/07 0554) SpO2:  [94 %-96 %] 96 % (01/07 0554) Last BM Date: 02/15/17(Patient states "Wednesday")  Intake/Output from previous day: 01/06 0701 - 01/07 0700 In: 4170.5 [I.V.:4070.5; IV Piggyback:100] Out: 1100 [Urine:1100] Intake/Output this shift: No intake/output data recorded.  Physical exam:  General: No acute distress Chest: Clear to auscultation Heart: Regular rate and rhythm Abdomen: Soft, mildly tender to palpation in the upper abdomen, moderately distended and tympanic.  Lab Results: CBC  Recent Labs    02/18/17 1725 02/19/17 0418  WBC 13.4* 10.7*  HGB 17.0 14.2  HCT 49.7 41.3  PLT 379 308   BMET Recent Labs    02/18/17 1725 02/19/17 0418  NA 133* 135  K 3.9 3.5  CL 94* 98*  CO2 28 27  GLUCOSE 147* 142*  BUN 13 11  CREATININE 0.90 0.79  CALCIUM 10.0 8.9   PT/INR No results for input(s): LABPROT, INR in the last 72 hours. ABG No results for input(s): PHART, HCO3 in the last 72 hours.  Invalid input(s): PCO2, PO2  Studies/Results: Ct Abdomen Pelvis W Contrast  Result Date: 02/18/2017 CLINICAL DATA:  Worsening abd. Distention and pain since last scan, known diverticulitis. EXAM: CT ABDOMEN AND PELVIS WITH CONTRAST TECHNIQUE: Multidetector CT imaging of the abdomen and pelvis was performed using the standard protocol following bolus administration of intravenous contrast. CONTRAST:  155mL ISOVUE-300 IOPAMIDOL (ISOVUE-300) INJECTION 61% COMPARISON:  CT abdomen  and pelvis on 02/17/2016 FINDINGS: Lower chest: There is mild bibasilar atelectasis. Heart size is normal. Hepatobiliary: Gallbladder is distended and otherwise normal in CT appearance. Pancreas: Unremarkable. No pancreatic ductal dilatation or surrounding inflammatory changes. Spleen: Normal in size without focal abnormality. Adrenals/Urinary Tract: Normal adrenal glands. Normal appearance of both kidneys. No hydronephrosis. Urinary bladder is decompressed and normal in appearance. Stomach/Bowel: Stomach is distended. There is significant small bowel obstruction, best localized to the mid to distal small bowel loops. Normal appendix. There is continued inflammation in the region of the sigmoid colon related to acute diverticulitis. There is new perforation, with small locules of gas adjacent to the colon. There is new fluid within the sigmoid mesentery dependent pelvis. Vascular/Lymphatic: Small mesenteric lymph nodes are identified, slightly increased over prior prior study. There is normal vascular opacification of the celiac axis, superior mesenteric artery, and inferior mesenteric artery. Normal appearance of the portal venous system and inferior vena cava. Reproductive: Prostate is unremarkable. Other: No abdominal wall hernia or abnormality. Musculoskeletal: No acute or significant osseous findings. IMPRESSION: 1. Interval perforation of the bowel, most likely in the region of the sigmoid colon secondary to acute diverticulitis. 2. High-grade small bowel obstruction localized to the mid-distal small bowel. 3. Bibasilar atelectasis. Critical Value/emergent results were called by telephone at the time of interpretation on 02/18/2017 at 10:57 pm to Dr. Lavonia Drafts , who verbally acknowledged these results. Electronically Signed   By: Nolon Nations M.D.   On: 02/18/2017 22:58   Dg Abd Portable 2v  Result Date: 02/20/2017 CLINICAL DATA:  Abdominal pain  EXAM: PORTABLE ABDOMEN - 2 VIEW COMPARISON:  CT  02/18/2017 FINDINGS: Previously administered contrast has passed into the right colon, with filling of the appendix. Abnormal diffuse small bowel pattern with dilated fluid and air-filled small intestine consistent with partial small bowel obstruction. No visible free air. No abnormal bone finding more soft tissue calcification. IMPRESSION: Consistent with partial small bowel obstruction. Previously administered contrast has passed into the right colon. Electronically Signed   By: Nelson Chimes M.D.   On: 02/20/2017 08:57    Anti-infectives: Anti-infectives (From admission, onward)   Start     Dose/Rate Route Frequency Ordered Stop   02/18/17 2315  piperacillin-tazobactam (ZOSYN) IVPB 3.375 g     3.375 g 12.5 mL/hr over 240 Minutes Intravenous Every 8 hours 02/18/17 2307     02/18/17 2300  piperacillin-tazobactam (ZOSYN) IVPB 3.375 g  Status:  Discontinued     3.375 g 100 mL/hr over 30 Minutes Intravenous Every 8 hours 02/18/17 2245 02/18/17 2307   02/18/17 2230  piperacillin-tazobactam (ZOSYN) IVPB 3.375 g     3.375 g 100 mL/hr over 30 Minutes Intravenous  Once 02/18/17 2219 02/18/17 2335      Assessment/Plan:  32 year old male admitted for a complicated diverticulitis.  All of his diverticulitis appears to be improving and responding to therapy he continues to have evidence of bowel obstruction versus ileus proximal to this.  This was visualized on x-ray this morning.  Encourage ambulation and incentive spirometer usage.  Await further return of bowel function and resolution of pain prior to starting him on a diet.  Mele Sylvester T. Adonis Huguenin, MD, [facility] General Sur[facility]  Day [facility] [phone] Night [facility] [phone] 02/20/2017

## 2017-02-21 ENCOUNTER — Inpatient Hospital Stay: Payer: Medicaid Other

## 2017-02-21 DIAGNOSIS — K5780 Diverticulitis of intestine, part unspecified, with perforation and abscess without bleeding: Secondary | ICD-10-CM

## 2017-02-21 LAB — BASIC METABOLIC PANEL
Anion gap: 8 (ref 5–15)
BUN: 7 mg/dL (ref 6–20)
CO2: 26 mmol/L (ref 22–32)
CREATININE: 0.62 mg/dL (ref 0.61–1.24)
Calcium: 8.8 mg/dL — ABNORMAL LOW (ref 8.9–10.3)
Chloride: 101 mmol/L (ref 101–111)
GFR calc Af Amer: >60 mL/min (ref >60)
GFR calc non Af Amer: >60 mL/min (ref >60)
Glucose, Bld: 108 mg/dL — ABNORMAL HIGH (ref 65–99)
Potassium: 3.4 mmol/L — ABNORMAL LOW (ref 3.5–5.1)
SODIUM: 135 mmol/L (ref 135–145)

## 2017-02-21 LAB — CBC
HCT: 38.4 % — ABNORMAL LOW (ref 40.0–52.0)
HEMOGLOBIN: 13.2 g/dL (ref 13.0–18.0)
MCH: 31.3 pg (ref 26.0–34.0)
MCHC: 34.2 g/dL (ref 32.0–36.0)
MCV: 91.3 fL (ref 80.0–100.0)
Platelets: 317 10*3/uL (ref 150–440)
RBC: 4.21 MIL/uL — ABNORMAL LOW (ref 4.40–5.90)
RDW: 12.8 % (ref 11.5–14.5)
WBC: 9 10*3/uL (ref 3.8–10.6)

## 2017-02-21 LAB — HIV ANTIBODY (ROUTINE TESTING W REFLEX): HIV Screen 4th Generation wRfx: NONREACTIVE

## 2017-02-21 LAB — PROTIME-INR
INR: 1.07
PROTHROMBIN TIME: 13.8 s (ref 11.4–15.2)

## 2017-02-21 MED ORDER — ACETAMINOPHEN 10 MG/ML IV SOLN
1000.0000 mg | Freq: Once | INTRAVENOUS | Status: AC
  Administered 2017-02-21: 1000 mg via INTRAVENOUS
  Filled 2017-02-21: qty 100

## 2017-02-21 MED ORDER — MIDAZOLAM HCL 5 MG/5ML IJ SOLN
INTRAMUSCULAR | Status: AC | PRN
  Administered 2017-02-21: 1 mg via INTRAVENOUS
  Administered 2017-02-21: 2 mg via INTRAVENOUS

## 2017-02-21 MED ORDER — MIDAZOLAM HCL 2 MG/2ML IJ SOLN
INTRAMUSCULAR | Status: AC | PRN
  Administered 2017-02-21: 2 mg via INTRAVENOUS

## 2017-02-21 MED ORDER — HYDROMORPHONE HCL 1 MG/ML IJ SOLN
1.0000 mg | INTRAMUSCULAR | Status: DC | PRN
  Administered 2017-02-21 – 2017-02-24 (×19): 1 mg via INTRAVENOUS
  Filled 2017-02-21 (×19): qty 1

## 2017-02-21 MED ORDER — IOPAMIDOL (ISOVUE-300) INJECTION 61%
15.0000 mL | INTRAVENOUS | Status: AC
  Administered 2017-02-21 (×2): 15 mL via ORAL

## 2017-02-21 MED ORDER — SODIUM CHLORIDE 0.9 % IV BOLUS (SEPSIS)
1000.0000 mL | Freq: Once | INTRAVENOUS | Status: AC
  Administered 2017-02-21: 1000 mL via INTRAVENOUS

## 2017-02-21 MED ORDER — KETOROLAC TROMETHAMINE 30 MG/ML IJ SOLN
30.0000 mg | Freq: Once | INTRAMUSCULAR | Status: AC
  Administered 2017-02-21: 30 mg via INTRAVENOUS

## 2017-02-21 MED ORDER — LIDOCAINE HCL 1 % IJ SOLN
INTRAMUSCULAR | Status: AC | PRN
  Administered 2017-02-21: 10 mL via INTRADERMAL

## 2017-02-21 MED ORDER — FENTANYL CITRATE (PF) 100 MCG/2ML IJ SOLN
INTRAMUSCULAR | Status: AC | PRN
  Administered 2017-02-21 (×4): 50 ug via INTRAVENOUS

## 2017-02-21 MED ORDER — HEPARIN SODIUM (PORCINE) 5000 UNIT/ML IJ SOLN
5000.0000 [IU] | Freq: Three times a day (TID) | INTRAMUSCULAR | Status: DC
  Administered 2017-02-22 – 2017-02-24 (×6): 5000 [IU] via SUBCUTANEOUS
  Filled 2017-02-21 (×6): qty 1

## 2017-02-21 MED ORDER — IOPAMIDOL (ISOVUE-300) INJECTION 61%
100.0000 mL | Freq: Once | INTRAVENOUS | Status: AC | PRN
  Administered 2017-02-21: 100 mL via INTRAVENOUS

## 2017-02-21 MED ORDER — SODIUM CHLORIDE 0.9% FLUSH
5.0000 mL | Freq: Three times a day (TID) | INTRAVENOUS | Status: DC
  Administered 2017-02-21 – 2017-02-24 (×9): 5 mL via INTRAVENOUS

## 2017-02-21 MED ORDER — MIDAZOLAM HCL 5 MG/5ML IJ SOLN
INTRAMUSCULAR | Status: AC
  Filled 2017-02-21: qty 5

## 2017-02-21 MED ORDER — KETOROLAC TROMETHAMINE 30 MG/ML IJ SOLN
INTRAMUSCULAR | Status: AC
  Administered 2017-02-21: 30 mg via INTRAVENOUS
  Filled 2017-02-21: qty 1

## 2017-02-21 MED ORDER — HEPARIN SODIUM (PORCINE) 5000 UNIT/ML IJ SOLN: 5000.0000 [IU] | Freq: Three times a day (TID) | INTRAMUSCULAR | Status: DC

## 2017-02-21 MED ORDER — FENTANYL CITRATE (PF) 100 MCG/2ML IJ SOLN
INTRAMUSCULAR | Status: AC
  Filled 2017-02-21: qty 4

## 2017-02-21 NOTE — Progress Notes (Signed)
Notified dr.pabon of pt moaning and yelling in pain and already had given 12 mg morph since 9pm. Added 1x toradol to be given with morph. Pt unable to even stand at this point bc of pain. Acknowledged and want xray done now if possible. Pt being transported by cna to xray.

## 2017-02-21 NOTE — Procedures (Signed)
Pre procedural Dx: Diverticular Abscess Post procedural Dx: Same  Technically successful CT guided placed of a 10 Fr drainage catheter placement into the pelvic abscess yielding 30 cc of purulent fluid.    All aspirated samples sent to the laboratory for analysis.    EBL: Minimal  Complications: None immediate  Ronny Bacon, MD Pager #: [phone]

## 2017-02-21 NOTE — Progress Notes (Signed)
Subjective: Patient had worsening new abdominal pain overnight.  Patient reports he had a bowel movement this morning and now feels better.  Continues to have some abdominal discomfort.  He denies any nausea or vomiting but states his abdomen is still distended.  Vital signs in last 24 hours: Temp:  [97.5 F (36.4 C)-99.1 F (37.3 C)] 97.5 F (36.4 C) (01/08 0415) Pulse Rate:  [84-90] 90 (01/08 0415) Resp:  [18-20] 18 (01/08 0415) BP: (146-149)/(97-99) 147/99 (01/08 0415) SpO2:  [96 %-99 %] 96 % (01/08 0415) Last BM Date: 02/15/17  Intake/Output from previous day: 01/07 0701 - 01/08 0700 In: 3809 [I.V.:2478; IV Piggyback:1331] Out: 1475 [Urine:1475]  GI: Abdomen is soft, minimally tender to palpation in the left upper quadrant, mildly distended.  No evidence of peritonitis on exam  Lab Results:  CBC Recent Labs    02/19/17 0418 02/21/17 0411  WBC 10.7* 9.0  HGB 14.2 13.2  HCT 41.3 38.4*  PLT 308 317   CMP     Component Value Date/Time   NA 135 02/21/2017 0411   NA 138 06/13/2014 1837   K 3.4 (L) 02/21/2017 0411   K 3.7 06/13/2014 1837   CL 101 02/21/2017 0411   CL 105 06/13/2014 1837   CO2 26 02/21/2017 0411   CO2 24 06/13/2014 1837   GLUCOSE 108 (H) 02/21/2017 0411   GLUCOSE 91 06/13/2014 1837   BUN 7 02/21/2017 0411   BUN 10 06/13/2014 1837   CREATININE 0.62 02/21/2017 0411   CREATININE 0.73 06/13/2014 1837   CALCIUM 8.8 (L) 02/21/2017 0411   CALCIUM 8.4 (L) 06/13/2014 1837   PROT 8.8 (H) 02/18/2017 1725   PROT 7.8 06/13/2014 1837   ALBUMIN 4.2 02/18/2017 1725   ALBUMIN 4.6 06/13/2014 1837   AST 12 (L) 02/18/2017 1725   AST 42 (H) 06/13/2014 1837   ALT 13 (L) 02/18/2017 1725   ALT 21 06/13/2014 1837   ALKPHOS 56 02/18/2017 1725   ALKPHOS 56 06/13/2014 1837   BILITOT 0.9 02/18/2017 1725   BILITOT 0.4 06/13/2014 1837   GFRNONAA >60 02/21/2017 0411   GFRNONAA >60 06/13/2014 1837   GFRAA >60 02/21/2017 0411   GFRAA >60 06/13/2014 1837    PT/INR No results for input(s): LABPROT, INR in the last 72 hours.  Studies/Results: Ct Abdomen Pelvis W Contrast  Result Date: 02/21/2017 CLINICAL DATA:  32 year old male with diverticulitis and ileus. Subsequent encounter. EXAM: CT ABDOMEN AND PELVIS WITH CONTRAST TECHNIQUE: Multidetector CT imaging of the abdomen and pelvis was performed using the standard protocol following bolus administration of intravenous contrast. CONTRAST:  149mL ISOVUE-300 IOPAMIDOL (ISOVUE-300) INJECTION 61% COMPARISON:  02/21/2017 plain film exam. 02/18/2017 and 02/16/2017 CT. FINDINGS: Lower chest: Progressive bibasilar consolidation and small pleural effusions. This may represent atelectasis or infiltrate. Top-normal size heart. Hepatobiliary: Minimal prominence intrahepatic biliary ducts. No calcified common bile duct stone or obstructing lesion. Dilated gallbladder. Minimal gallbladder wall thickening versus tiny amount of pericholecystic fluid. If there are right upper quadrant tenderness, sonogram could be obtained for further delineation. Pancreas: No worrisome primary pancreatic mass or inflammation. Spleen: No splenic mass or enlargement. Adrenals/Urinary Tract: Minimal renal pelvis prominence without calyceal dilation to suggest hydronephrosis. Possible tiny right upper pole renal cyst. No worrisome renal or adrenal lesion. Noncontrast filled views of the urinary bladder unremarkable. Stomach/Bowel: Findings consistent with sigmoid diverticulitis with perforation. Tiny gas and fluid containing collection arises from perforated sigmoid diverticulum suggestive of developing small abscess left pelvis measuring 4.2 x 1 x 0.8  cm. Right pelvic 6.1 x 3.1 x 4 cm fluid collection now with enhancing walls suggesting developing abscess. Lower abdominal 5.1 x 2.7 x 3 cm fluid collection with minimal enhancement of walls may represent fluid collection which has not developed completely into an abscess. Fluid seen in the upper  abdomen surrounding the liver and spleen and in the paracolic gutter region. Small bowel obstruction. Distal ilium narrowing may be secondary to stricture caused by inflammatory process. Vascular/Lymphatic: No aortic aneurysm. No major vessel occlusion. No venous gas. Increased number of normal/top-normal size lymph nodes probably reactive in origin. Reproductive: Negative Other: No bowel containing hernia. Musculoskeletal: Nonspecific sclerotic focus inferior right femoral neck. This may represent a bone island with tiny bone island right femoral head and left ischium. Mild degenerative changes lower thoracic and upper lumbar spine. IMPRESSION: Small bowel obstruction. Distal ilium narrowing may be secondary to stricture caused by inflammatory process as detailed below. Findings consistent with sigmoid diverticulitis with perforation. Tiny gas and fluid containing collection arises from perforated sigmoid diverticulum suggestive of developing small abscess left pelvis measuring 4.2 x 1 x 0.8 cm. Right pelvic 6.1 x 3.1 x 4 cm fluid collection now with enhancing walls suggesting developing abscess. Lower abdominal 5.1 x 2.7 x 3 cm fluid collection with minimal enhancement of walls may represent fluid collection which has not developed completely into an abscess. Fluid seen in the upper abdomen surrounding the liver and spleen and in the paracolic gutter region. Dilated gallbladder. Minimal gallbladder wall thickening versus tiny amount of pericholecystic fluid. If there were right upper quadrant tenderness, sonogram could be obtained for further delineation. Minimal prominence intrahepatic biliary ducts. No calcified common bile duct stone or obstructing lesion. Progressive bibasilar consolidation and small pleural effusions. This may represent atelectasis or infiltrate. These results will be called to the ordering clinician or representative by the Radiologist Assistant, and communication documented in the PACS or  zVision Dashboard. Electronically Signed   By: Genia Del M.D.   On: 02/21/2017 08:19   Dg Abd Portable 2v  Result Date: 02/21/2017 CLINICAL DATA:  Small bowel obstruction. EXAM: PORTABLE ABDOMEN - 2 VIEW COMPARISON:  Radiograph yesterday at 0840 hour.  CT 02/18/2017 FINDINGS: Small bowel dilatation with air-fluid levels with slight progression from prior exam. Unchanged enteric contrast in the right colon likely from prior CT. No free air under the hemidiaphragms, patient with micro perforation of the sigmoid colon on CT. Bibasilar atelectasis. IMPRESSION: Unchanged bowel gas pattern with small bowel dilatation and air-fluid levels suspicious for small bowel obstruction. Unchanged enteric contrast in the right colon from radiographs yesterday, no interval progression. Electronically Signed   By: Jeb Levering M.D.   On: 02/21/2017 05:11   Dg Abd Portable 2v  Result Date: 02/20/2017 CLINICAL DATA:  Abdominal pain EXAM: PORTABLE ABDOMEN - 2 VIEW COMPARISON:  CT 02/18/2017 FINDINGS: Previously administered contrast has passed into the right colon, with filling of the appendix. Abnormal diffuse small bowel pattern with dilated fluid and air-filled small intestine consistent with partial small bowel obstruction. No visible free air. No abnormal bone finding more soft tissue calcification. IMPRESSION: Consistent with partial small bowel obstruction. Previously administered contrast has passed into the right colon. Electronically Signed   By: Nelson Chimes M.D.   On: 02/20/2017 08:57    Assessment/Plan: 32 year old male with a complicated diverticulitis.  Repeat CT scan obtained this morning which shows developing abscesses in the pelvis.  Case was discussed with interventional radiology who will attempt to place a percutaneous  drain in 1 or multiple of the abscesses later this afternoon due to heparin being administered this morning.  Continue n.p.o., continue IV antibiotics, continue IV fluids.  Encourage  ambulation and incentive spirometer.  Should he fail to improve rapidly after percutaneous drainage he may require an operation primarily because the small bowel appears to be coming adhered to the diverticular disease.   Clayburn Pert, MD [facility]  Day [facility] [phone] Night [facility] [phone]  02/21/2017
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
## 2017-02-21 NOTE — Progress Notes (Signed)
Called by RN for severe pain. Pt seen and examined. Films reviewed. Xray showed dilated loops of SB contrast right colon, no free air. HE is in NAD, sitting up in bed The pain he describes is more worst from the pain that he is exhibiting on my PE AVSS, non tachycardic  PE NAD Abd: soft, distended, mild TTP , no PERITONITIS, NO REBOUND  A/p Diverticulitis w ileus I will order a CT to make sure there is no other changes, may need an NGT. D/W the pt that he may need a [facility]mann[facility] if he does not improve and he wishes to avoid this at all cost.  HE does drink daily and is not narcotic naive  D/W Mrs. Janace Aris in detail

## 2017-02-21 NOTE — Consult Note (Signed)
MEDICATION-RELATED CONSULT NOTE  IR Procedure Consult - Anticoagulant/Antiplatelet PTA/Inpatient Med List Review by Pharmacist   Procedure: CT Guided drain placement into pelvic abscess.     Completed: 1/8 @ 1440  Post-Procedural bleeding risk per IR MD assessment:  Standard   Antithrombotic medications on inpatient or PTA profile prior to procedure:   Heparin SubQ 5000u every 8 hours   Recommended restart time per IR Post-Procedure Guidelines:  Next Day  Plan:     Restart heparin Subs Q 5000u every 8 hours tomorrow.  Pernell Dupre, PharmD, BCPS Clinical Pharmacist 02/21/2017 4:12 PM

## 2017-02-22 LAB — BASIC METABOLIC PANEL
Anion gap: 9 (ref 5–15)
BUN: 6 mg/dL (ref 6–20)
CALCIUM: 8.7 mg/dL — AB (ref 8.9–10.3)
CO2: 28 mmol/L (ref 22–32)
Chloride: 96 mmol/L — ABNORMAL LOW (ref 101–111)
Creatinine, Ser: 0.88 mg/dL (ref 0.61–1.24)
GFR calc Af Amer: >60 mL/min (ref >60)
Glucose, Bld: 113 mg/dL — ABNORMAL HIGH (ref 65–99)
POTASSIUM: 3.5 mmol/L (ref 3.5–5.1)
Sodium: 133 mmol/L — ABNORMAL LOW (ref 135–145)

## 2017-02-22 LAB — PHOSPHORUS: Phosphorus: 4 mg/dL (ref 2.5–4.6)

## 2017-02-22 LAB — CBC
HCT: 38.4 % — ABNORMAL LOW (ref 40.0–52.0)
Hemoglobin: 13.3 g/dL (ref 13.0–18.0)
MCH: 31.2 pg (ref 26.0–34.0)
MCHC: 34.6 g/dL (ref 32.0–36.0)
MCV: 90.2 fL (ref 80.0–100.0)
PLATELETS: 335 10*3/uL (ref 150–440)
RBC: 4.26 MIL/uL — AB (ref 4.40–5.90)
RDW: 12.8 % (ref 11.5–14.5)
WBC: 9.6 10*3/uL (ref 3.8–10.6)

## 2017-02-22 LAB — MAGNESIUM: MAGNESIUM: 2.1 mg/dL (ref 1.7–2.4)

## 2017-02-22 NOTE — Progress Notes (Signed)
CC: Abdominal pain Subjective: Patient reports that his pain has improved somewhat since the drain was placed yesterday.  Continues to have left-sided abdominal discomfort though.  Passing flatus and denies any nausea or vomiting.  Objective: Vital signs in last 24 hours: Temp:  [97.5 F (36.4 C)-99.1 F (37.3 C)] 97.5 F (36.4 C) (01/09 0423) Pulse Rate:  [85-104] 86 (01/09 0423) Resp:  [14-28] 16 (01/09 0423) BP: (128-172)/(86-115) 128/86 (01/09 0423) SpO2:  [94 %-99 %] 94 % (01/09 0423) Last BM Date: 02/15/17  Intake/Output from previous day: 01/08 0701 - 01/09 0700 In: 1420 [I.V.:1322; IV Piggyback:98] Out: 2425 [Urine:2425] Intake/Output this shift: Total I/O In: 1510.3 [I.V.:1451.7; IV Piggyback:58.6] Out: 450 [Urine:450]  Physical exam:  General: No acute distress Chest: Clear to auscultation Heart: Rate and rhythm Abdomen: Soft, mildly tender to deep palpation left lower quadrant, mildly distended.  Lab Results: CBC  Recent Labs    02/21/17 0411 02/22/17 0727  WBC 9.0 9.6  HGB 13.2 13.3  HCT 38.4* 38.4*  PLT 317 335   BMET Recent Labs    02/21/17 0411 02/22/17 0727  NA 135 133*  K 3.4* 3.5  CL 101 96*  CO2 26 28  GLUCOSE 108* 113*  BUN 7 6  CREATININE 0.62 0.88  CALCIUM 8.8* 8.7*   PT/INR Recent Labs    02/21/17 0855  LABPROT 13.8  INR 1.07   ABG No results for input(s): PHART, HCO3 in the last 72 hours.  Invalid input(s): PCO2, PO2  Studies/Results: Ct Abdomen Pelvis W Contrast  Result Date: 02/21/2017 CLINICAL DATA:  32 year old male with diverticulitis and ileus. Subsequent encounter. EXAM: CT ABDOMEN AND PELVIS WITH CONTRAST TECHNIQUE: Multidetector CT imaging of the abdomen and pelvis was performed using the standard protocol following bolus administration of intravenous contrast. CONTRAST:  18mL ISOVUE-300 IOPAMIDOL (ISOVUE-300) INJECTION 61% COMPARISON:  02/21/2017 plain film exam. 02/18/2017 and 02/16/2017 CT. FINDINGS: Lower  chest: Progressive bibasilar consolidation and small pleural effusions. This may represent atelectasis or infiltrate. Top-normal size heart. Hepatobiliary: Minimal prominence intrahepatic biliary ducts. No calcified common bile duct stone or obstructing lesion. Dilated gallbladder. Minimal gallbladder wall thickening versus tiny amount of pericholecystic fluid. If there are right upper quadrant tenderness, sonogram could be obtained for further delineation. Pancreas: No worrisome primary pancreatic mass or inflammation. Spleen: No splenic mass or enlargement. Adrenals/Urinary Tract: Minimal renal pelvis prominence without calyceal dilation to suggest hydronephrosis. Possible tiny right upper pole renal cyst. No worrisome renal or adrenal lesion. Noncontrast filled views of the urinary bladder unremarkable. Stomach/Bowel: Findings consistent with sigmoid diverticulitis with perforation. Tiny gas and fluid containing collection arises from perforated sigmoid diverticulum suggestive of developing small abscess left pelvis measuring 4.2 x 1 x 0.8 cm. Right pelvic 6.1 x 3.1 x 4 cm fluid collection now with enhancing walls suggesting developing abscess. Lower abdominal 5.1 x 2.7 x 3 cm fluid collection with minimal enhancement of walls may represent fluid collection which has not developed completely into an abscess. Fluid seen in the upper abdomen surrounding the liver and spleen and in the paracolic gutter region. Small bowel obstruction. Distal ilium narrowing may be secondary to stricture caused by inflammatory process. Vascular/Lymphatic: No aortic aneurysm. No major vessel occlusion. No venous gas. Increased number of normal/top-normal size lymph nodes probably reactive in origin. Reproductive: Negative Other: No bowel containing hernia. Musculoskeletal: Nonspecific sclerotic focus inferior right femoral neck. This may represent a bone island with tiny bone island right femoral head and left ischium. Mild  degenerative changes lower  thoracic and upper lumbar spine. IMPRESSION: Small bowel obstruction. Distal ilium narrowing may be secondary to stricture caused by inflammatory process as detailed below. Findings consistent with sigmoid diverticulitis with perforation. Tiny gas and fluid containing collection arises from perforated sigmoid diverticulum suggestive of developing small abscess left pelvis measuring 4.2 x 1 x 0.8 cm. Right pelvic 6.1 x 3.1 x 4 cm fluid collection now with enhancing walls suggesting developing abscess. Lower abdominal 5.1 x 2.7 x 3 cm fluid collection with minimal enhancement of walls may represent fluid collection which has not developed completely into an abscess. Fluid seen in the upper abdomen surrounding the liver and spleen and in the paracolic gutter region. Dilated gallbladder. Minimal gallbladder wall thickening versus tiny amount of pericholecystic fluid. If there were right upper quadrant tenderness, sonogram could be obtained for further delineation. Minimal prominence intrahepatic biliary ducts. No calcified common bile duct stone or obstructing lesion. Progressive bibasilar consolidation and small pleural effusions. This may represent atelectasis or infiltrate. These results will be called to the ordering clinician or representative by the Radiologist Assistant, and communication documented in the PACS or zVision Dashboard. Electronically Signed   By: Genia Del M.D.   On: 02/21/2017 08:19   Dg Abd Portable 2v  Result Date: 02/21/2017 CLINICAL DATA:  Small bowel obstruction. EXAM: PORTABLE ABDOMEN - 2 VIEW COMPARISON:  Radiograph yesterday at 0840 hour.  CT 02/18/2017 FINDINGS: Small bowel dilatation with air-fluid levels with slight progression from prior exam. Unchanged enteric contrast in the right colon likely from prior CT. No free air under the hemidiaphragms, patient with micro perforation of the sigmoid colon on CT. Bibasilar atelectasis. IMPRESSION: Unchanged  bowel gas pattern with small bowel dilatation and air-fluid levels suspicious for small bowel obstruction. Unchanged enteric contrast in the right colon from radiographs yesterday, no interval progression. Electronically Signed   By: Jeb Levering M.D.   On: 02/21/2017 05:11   Ct Image Guided Drainage By Percutaneous Catheter  Result Date: 02/21/2017 INDICATION: Minute with diverticulosis, now with concern for diverticular abscess and developing small bowel obstruction. Please perform CT-guided percutaneous drainage catheter placement for infection source control purposes. EXAM: CT IMAGE GUIDED DRAINAGE BY PERCUTANEOUS CATHETER COMPARISON:  CT abdomen pelvis-02/21/2017; 02/18/2017 MEDICATIONS: The patient is currently admitted to the hospital and receiving intravenous antibiotics. The antibiotics were administered within an appropriate time frame prior to the initiation of the procedure. ANESTHESIA/SEDATION: Moderate (conscious) sedation was employed during this procedure. A total of Versed 5 mg and Fentanyl 200 mcg was administered intravenously. Moderate Sedation Time: 20 minutes. The patient's level of consciousness and vital signs were monitored continuously by radiology nursing throughout the procedure under my direct supervision. CONTRAST:  None COMPLICATIONS: None immediate. PROCEDURE: Informed written consent was obtained from the patient after a discussion of the risks, benefits and alternatives to treatment. The patient was placed prone on the CT gantry and a pre procedural CT was performed re-demonstrating the known abscess/fluid collection within the right hemipelvis with dominant component measuring approximately 5.6 x 3.1 cm (image 11, series 2). The procedure was planned. A timeout was performed prior to the initiation of the procedure. The skin overlying the right buttocks was prepped and draped in the usual sterile fashion. The overlying soft tissues were anesthetized with 1% lidocaine with  epinephrine. Appropriate trajectory was planned with the use of a 22 gauge spinal needle. An 18 gauge trocar needle was advanced into the abscess/fluid collection and a short Amplatz super stiff wire was coiled within the collection.  Appropriate positioning was confirmed with a limited CT scan. The tract was serially dilated allowing placement of a 10 [place] all-purpose drainage catheter. Appropriate positioning was confirmed with a limited postprocedural CT scan. Approximately 30 ml of purulent fluid was aspirated. The tube was connected to a drainage bag and sutured in place. A dressing was placed. The patient tolerated the procedure well without immediate post procedural complication. IMPRESSION: Successful CT guided placement of a 10 French all purpose drain catheter into the right hemi pelvis via right trans gluteal approach with aspiration of 30 mL of purulent fluid. Samples were sent to the laboratory as requested by the ordering clinical team. Electronically Signed   By: Sandi Mariscal M.D.   On: 02/21/2017 15:49    Anti-infectives: Anti-infectives (From admission, onward)   Start     Dose/Rate Route Frequency Ordered Stop   02/18/17 2315  piperacillin-tazobactam (ZOSYN) IVPB 3.375 g     3.375 g 12.5 mL/hr over 240 Minutes Intravenous Every 8 hours 02/18/17 2307     02/18/17 2300  piperacillin-tazobactam (ZOSYN) IVPB 3.375 g  Status:  Discontinued     3.375 g 100 mL/hr over 30 Minutes Intravenous Every 8 hours 02/18/17 2245 02/18/17 2307   02/18/17 2230  piperacillin-tazobactam (ZOSYN) IVPB 3.375 g     3.375 g 100 mL/hr over 30 Minutes Intravenous  Once 02/18/17 2219 02/18/17 2335      Assessment/Plan:  32 year old male with diverticulitis with abscess now.  Status post IR drainage yesterday.  Discussed that we will give him 24-48 hours to improve with the drainage and that should he fail to improve then at this time to discuss an operative intervention.  He voiced understanding.  Encourage  ambulation and incentive spirometer usage.  Miguel Christiana T. Adonis Huguenin, MD, [facility]  Day [facility] [phone] Night [facility] [phone] 02/22/2017

## 2017-02-23 ENCOUNTER — Inpatient Hospital Stay: Payer: Medicaid Other

## 2017-02-23 DIAGNOSIS — K56609 Unspecified intestinal obstruction, unspecified as to partial versus complete obstruction: Secondary | ICD-10-CM

## 2017-02-23 LAB — CBC
HEMATOCRIT: 39.8 % — AB (ref 40.0–52.0)
Hemoglobin: 13.8 g/dL (ref 13.0–18.0)
MCH: 31.3 pg (ref 26.0–34.0)
MCHC: 34.6 g/dL (ref 32.0–36.0)
MCV: 90.4 fL (ref 80.0–100.0)
Platelets: 373 10*3/uL (ref 150–440)
RBC: 4.41 MIL/uL (ref 4.40–5.90)
RDW: 13.1 % (ref 11.5–14.5)
WBC: 9.8 10*3/uL (ref 3.8–10.6)

## 2017-02-23 LAB — BASIC METABOLIC PANEL
ANION GAP: 9 (ref 5–15)
BUN: <5 mg/dL — ABNORMAL LOW (ref 6–20)
CALCIUM: 8.7 mg/dL — AB (ref 8.9–10.3)
CO2: 28 mmol/L (ref 22–32)
Chloride: 99 mmol/L — ABNORMAL LOW (ref 101–111)
Creatinine, Ser: 0.83 mg/dL (ref 0.61–1.24)
GFR calc Af Amer: >60 mL/min (ref >60)
GFR calc non Af Amer: >60 mL/min (ref >60)
GLUCOSE: 107 mg/dL — AB (ref 65–99)
POTASSIUM: 3.5 mmol/L (ref 3.5–5.1)
Sodium: 136 mmol/L (ref 135–145)

## 2017-02-23 NOTE — Progress Notes (Signed)
CC: Diverticulitis Subjective: Patient reports he continues to feel better.  His abdomen still slightly distended but his abdominal pain is much improved and he is been passing flatus and having bowel movements.  He states he is hungry.  Objective: Vital signs in last 24 hours: Temp:  [98 F (36.7 C)-98.8 F (37.1 C)] 98 F (36.7 C) (01/10 0600) Pulse Rate:  [83-97] 91 (01/10 0600) Resp:  [16-18] 18 (01/10 0600) BP: (134-160)/(84-99) 144/98 (01/10 0600) SpO2:  [96 %-98 %] 97 % (01/10 0600) Last BM Date: 02/22/17  Intake/Output from previous day: 01/09 0701 - 01/10 0700 In: 3259.3 [I.V.:3124.7; IV Piggyback:124.6] Out: 3329 [Urine:3295; Drains:20] Intake/Output this shift: No intake/output data recorded.  Physical exam:  General: No acute distress Chest: Clear to auscultation Heart: Rate and rhythm Abdomen: Soft nontender, minimally distended. Skin: IR placed drain in place with a seropurulent output.  Lab Results: CBC  Recent Labs    02/22/17 0727 02/23/17 0554  WBC 9.6 9.8  HGB 13.3 13.8  HCT 38.4* 39.8*  PLT 335 373   BMET Recent Labs    02/22/17 0727 02/23/17 0554  NA 133* 136  K 3.5 3.5  CL 96* 99*  CO2 28 28  GLUCOSE 113* 107*  BUN 6 <5*  CREATININE 0.88 0.83  CALCIUM 8.7* 8.7*   PT/INR Recent Labs    02/21/17 0855  LABPROT 13.8  INR 1.07   ABG No results for input(s): PHART, HCO3 in the last 72 hours.  Invalid input(s): PCO2, PO2  Studies/Results: Dg Abd Portable 2v  Result Date: 02/23/2017 CLINICAL DATA:  Small bowel obstruction. EXAM: PORTABLE ABDOMEN - 2 VIEW COMPARISON:  Abdominal x-ray and CT abdomen pelvis dated February 21, 2017. FINDINGS: Persistent small bowel dilatation, overall similar to prior study. Enteric contrast within the colon. New pigtail drainage catheter in the right pelvis. IMPRESSION: Unchanged small bowel obstruction. Electronically Signed   By: Titus Dubin M.D.   On: 02/23/2017 07:39   Ct Image Guided Drainage  By Percutaneous Catheter  Result Date: 02/21/2017 INDICATION: Minute with diverticulosis, now with concern for diverticular abscess and developing small bowel obstruction. Please perform CT-guided percutaneous drainage catheter placement for infection source control purposes. EXAM: CT IMAGE GUIDED DRAINAGE BY PERCUTANEOUS CATHETER COMPARISON:  CT abdomen pelvis-02/21/2017; 02/18/2017 MEDICATIONS: The patient is currently admitted to the hospital and receiving intravenous antibiotics. The antibiotics were administered within an appropriate time frame prior to the initiation of the procedure. ANESTHESIA/SEDATION: Moderate (conscious) sedation was employed during this procedure. A total of Versed 5 mg and Fentanyl 200 mcg was administered intravenously. Moderate Sedation Time: 20 minutes. The patient's level of consciousness and vital signs were monitored continuously by radiology nursing throughout the procedure under my direct supervision. CONTRAST:  None COMPLICATIONS: None immediate. PROCEDURE: Informed written consent was obtained from the patient after a discussion of the risks, benefits and alternatives to treatment. The patient was placed prone on the CT gantry and a pre procedural CT was performed re-demonstrating the known abscess/fluid collection within the right hemipelvis with dominant component measuring approximately 5.6 x 3.1 cm (image 11, series 2). The procedure was planned. A timeout was performed prior to the initiation of the procedure. The skin overlying the right buttocks was prepped and draped in the usual sterile fashion. The overlying soft tissues were anesthetized with 1% lidocaine with epinephrine. Appropriate trajectory was planned with the use of a 22 gauge spinal needle. An 18 gauge trocar needle was advanced into the abscess/fluid collection and a short Amplatz  super stiff wire was coiled within the collection. Appropriate positioning was confirmed with a limited CT scan. The tract was  serially dilated allowing placement of a 10 [place] all-purpose drainage catheter. Appropriate positioning was confirmed with a limited postprocedural CT scan. Approximately 30 ml of purulent fluid was aspirated. The tube was connected to a drainage bag and sutured in place. A dressing was placed. The patient tolerated the procedure well without immediate post procedural complication. IMPRESSION: Successful CT guided placement of a 10 French all purpose drain catheter into the right hemi pelvis via right trans gluteal approach with aspiration of 30 mL of purulent fluid. Samples were sent to the laboratory as requested by the ordering clinical team. Electronically Signed   By: Sandi Mariscal M.D.   On: 02/21/2017 15:49    Anti-infectives: Anti-infectives (From admission, onward)   Start     Dose/Rate Route Frequency Ordered Stop   02/18/17 2315  piperacillin-tazobactam (ZOSYN) IVPB 3.375 g     3.375 g 12.5 mL/hr over 240 Minutes Intravenous Every 8 hours 02/18/17 2307     02/18/17 2300  piperacillin-tazobactam (ZOSYN) IVPB 3.375 g  Status:  Discontinued     3.375 g 100 mL/hr over 30 Minutes Intravenous Every 8 hours 02/18/17 2245 02/18/17 2307   02/18/17 2230  piperacillin-tazobactam (ZOSYN) IVPB 3.375 g     3.375 g 100 mL/hr over 30 Minutes Intravenous  Once 02/18/17 2219 02/18/17 2335      Assessment/Plan:  32 year old male with diverticulitis and small bowel dilatation secondary to the inflammation near the diverticulitis.  Clinically he has improved in spite of his radiographic findings this morning.  Discussed at length with the patient about the current problem.  Given that he is clinically improved discussed the option of trying a p.o. challenge this morning.  This would entail a clear liquid diet.  So long as he could tolerate the clear liquid diet without nausea, vomiting, worsening abdominal pain then we could attempt to transition to all oral medications to continue and complete his therapy  for his diverticulitis.  Discussed should he not be able to tolerate a diet secondary to the obstruction that this would be an indication for an operation.  Patient voiced understanding and agreed with this plan.  Going to start a clear liquid diet, encourage ambulation, encourage incentive spirometer usage.  We will perform serial abdominal exams.  Chonda Baney T. Adonis Huguenin, MD, [facility] General Surgeon [facility]  [facility] [phone] [facility] [phone] 02/23/2017

## 2017-02-24 DIAGNOSIS — K5732 Diverticulitis of large intestine without perforation or abscess without bleeding: Secondary | ICD-10-CM

## 2017-02-24 DIAGNOSIS — K5720 Diverticulitis of large intestine with perforation and abscess without bleeding: Secondary | ICD-10-CM

## 2017-02-24 MED ORDER — OXYCODONE-ACETAMINOPHEN 5-325 MG PO TABS: 1.0000 | ORAL_TABLET | Freq: Four times a day (QID) | ORAL | 0 refills | Status: DC | PRN

## 2017-02-24 MED ORDER — AMOXICILLIN-POT CLAVULANATE 875-125 MG PO TABS
1.0000 | ORAL_TABLET | Freq: Two times a day (BID) | ORAL | Status: DC
  Administered 2017-02-24: 1 via ORAL
  Filled 2017-02-24: qty 1

## 2017-02-24 MED ORDER — OXYCODONE-ACETAMINOPHEN 5-325 MG PO TABS
1.0000 | ORAL_TABLET | Freq: Four times a day (QID) | ORAL | Status: DC | PRN
Start: 2017-02-24 — End: 2017-02-24
  Administered 2017-02-24 (×2): 1 via ORAL
  Filled 2017-02-24 (×2): qty 1

## 2017-02-24 MED ORDER — AMOXICILLIN-POT CLAVULANATE 875-125 MG PO TABS: 1.0000 | ORAL_TABLET | Freq: Two times a day (BID) | ORAL | 0 refills | Status: DC

## 2017-02-24 NOTE — Discharge Instructions (Signed)
Diverticulitis °Diverticulitis is infection or inflammation of small pouches (diverticula) in the colon that form due to a condition called diverticulosis. Diverticula can trap stool (feces) and bacteria, causing infection and inflammation. °Diverticulitis may cause severe stomach pain and diarrhea. It may lead to tissue damage in the colon that causes bleeding. The diverticula may also burst (rupture) and cause infected stool to enter other areas of the abdomen. °Complications of diverticulitis can include: °· Bleeding. °· Severe infection. °· Severe pain. °· Rupture (perforation) of the colon. °· Blockage (obstruction) of the colon. ° °What are the causes? °This condition is caused by stool becoming trapped in the diverticula, which allows bacteria to grow in the diverticula. This leads to inflammation and infection. °What increases the risk? °You are more likely to develop this condition if: °· You have diverticulosis. The risk for diverticulosis increases if: °? You are overweight or obese. °? You use tobacco products. °? You do not get enough exercise. °· You eat a diet that does not include enough fiber. High-fiber foods include fruits, vegetables, beans, nuts, and whole grains. ° °What are the signs or symptoms? °Symptoms of this condition may include: °· Pain and tenderness in the abdomen. The pain is normally located on the left side of the abdomen, but it may occur in other areas. °· Fever and chills. °· Bloating. °· Cramping. °· Nausea. °· Vomiting. °· Changes in bowel routines. °· Blood in your stool. ° °How is this diagnosed? °This condition is diagnosed based on: °· Your medical history. °· A physical exam. °· Tests to make sure there is nothing else causing your condition. These tests may include: °? Blood tests. °? Urine tests. °? Imaging tests of the abdomen, including X-rays, ultrasounds, MRIs, or CT scans. ° °How is this treated? °Most cases of this condition are mild and can be treated at home.  Treatment may include: °· Taking over-the-counter pain medicines. °· Following a clear liquid diet. °· Taking antibiotic medicines by mouth. °· Rest. ° °More severe cases may need to be treated at a hospital. Treatment may include: °· Not eating or drinking. °· Taking prescription pain medicine. °· Receiving antibiotic medicines through an IV tube. °· Receiving fluids and nutrition through an IV tube. °· Surgery. ° °When your condition is under control, your health care provider may recommend that you have a colonoscopy. This is an exam to look at the entire large intestine. During the exam, a lubricated, bendable tube is inserted into the anus and then passed into the rectum, colon, and other parts of the large intestine. A colonoscopy can show how severe your diverticula are and whether something else may be causing your symptoms. °Follow these instructions at home: °Medicines °· Take over-the-counter and prescription medicines only as told by your health care provider. These include fiber supplements, probiotics, and stool softeners. °· If you were prescribed an antibiotic medicine, take it as told by your health care provider. Do not stop taking the antibiotic even if you start to feel better. °· Do not drive or use heavy machinery while taking prescription pain medicine. °General instructions °· Follow a full liquid diet or another diet as directed by your health care provider. After your symptoms improve, your health care provider may tell you to change your diet. He or she may recommend that you eat a diet that contains at least 25 g (25 grams) of fiber daily. Fiber makes it easier to pass stool. Healthy sources of fiber include: °? Berries. One cup   contains 4-8 grams of fiber. °? Beans or lentils. One half cup contains 5-8 grams of fiber. °? Green vegetables. One cup contains 4 grams of fiber. °· Exercise for at least 30 minutes, 3 times each week. You should exercise hard enough to raise your heart rate and  break a sweat. °· Keep all follow-up visits as told by your health care provider. This is important. You may need a colonoscopy. °Contact a health care provider if: °· Your pain does not improve. °· You have a hard time drinking or eating food. °· Your bowel movements do not return to normal. °Get help right away if: °· Your pain gets worse. °· Your symptoms do not get better with treatment. °· Your symptoms suddenly get worse. °· You have a fever. °· You vomit more than one time. °· You have stools that are bloody, black, or tarry. °Summary °· Diverticulitis is infection or inflammation of small pouches (diverticula) in the colon that form due to a condition called diverticulosis. Diverticula can trap stool (feces) and bacteria, causing infection and inflammation. °· You are at higher risk for this condition if you have diverticulosis and you eat a diet that does not include enough fiber. °· Most cases of this condition are mild and can be treated at home. More severe cases may need to be treated at a hospital. °· When your condition is under control, your health care provider may recommend that you have an exam called a colonoscopy. This exam can show how severe your diverticula are and whether something else may be causing your symptoms. °This information is not intended to replace advice given to you by your health care provider. Make sure you discuss any questions you have with your health care provider. °Document Released: 11/10/2004 Document Revised: 03/05/2016 Document Reviewed: 03/05/2016 °Elsevier Interactive Patient Education © 2018 Elsevier Inc. ° °

## 2017-02-24 NOTE — Discharge Summary (Signed)
Patient ID: Martin Garcia MRN: [id number] DOB/AGE: 02-22-1985 32 y.o.  Admit date: 02/18/2017 Discharge date: 02/24/2017  Discharge Diagnoses:  Diverticulitis  Procedures Performed: IR placed drain of diverticular abscess  Discharged Condition: good  Hospital Course: Patient admitted with diverticulitis. Responded to antibiotics but developed diverticular abscess that required IR placed drain. Had evidence of small bowel obstruction related to his diverticular disease but was tolerating a regular diet and oral antibiotics at the time of discharge. His abdomen was mildly distended but soft and minimally tender to palpation.  Discharge Orders: Discharge Instructions    Call MD for:  difficulty breathing, headache or visual disturbances   Complete by:  As directed    Call MD for:  persistant nausea and vomiting   Complete by:  As directed    Call MD for:  severe uncontrolled pain   Complete by:  As directed    Call MD for:  temperature >100.4   Complete by:  As directed    Diet - low sodium heart healthy   Complete by:  As directed    Increase activity slowly   Complete by:  As directed       Disposition: 01-Home or Self Care  Discharge Medications: Allergies as of 02/24/2017      Reactions   Tramadol Nausea And Vomiting      Medication List    STOP taking these medications   ciprofloxacin 500 MG tablet Commonly known as:  CIPRO   HYDROcodone-acetaminophen 5-325 MG tablet Commonly known as:  NORCO/VICODIN   metroNIDAZOLE 500 MG tablet Commonly known as:  FLAGYL     TAKE these medications   amoxicillin-clavulanate 875-125 MG tablet Commonly known as:  AUGMENTIN Take 1 tablet by mouth every 12 (twelve) hours.   ibuprofen 200 MG tablet Commonly known as:  ADVIL,MOTRIN Take 200 mg by mouth every 6 (six) hours as needed.   oxyCODONE-acetaminophen 5-325 MG tablet Commonly known as:  PERCOCET/ROXICET Take 1-2 tablets by mouth every 6 (six) hours as needed for  moderate pain or severe pain. What changed:    how much to take  when to take this  reasons to take this        Follwup: Follow-up [facility]. Schedule an appointment as soon as possible for a visit in 1 week(s).   Specialty:  General Surgery Why:  drain follow up Contact information: [place] [address] [place] [phone]          Signed: Clayburn Pert 02/24/2017, 10:32 AM

## 2017-02-24 NOTE — Care Management (Signed)
Patient to discharge home today with JP in place. Bedside Rn to provide education on JP prior to discharge.  Patient provided with application to Medication Management, [place], and "[facility] Network:  Your Guide to [employer] and [employer] in [facility]"  Booklet.  Patient to discharge with rx for Augmentin goodrx coupon printed for [employer].  Out of pocket cost $27.75.  Patient denies issues obtaining medication. RNCM signing off.
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
## 2017-02-24 NOTE — Progress Notes (Signed)
Patient discharge teaching given, including activity, diet, follow-up appoints, and medications. Patient verbalized understanding of all discharge instructions. IV access was d/c'd. Vitals are stable. RN demonstrated how to flush catheter and educated him and girlfriend that they need to flush catheter every 8 hours. Pt.'s girlfriend demonstrated understanding by demonstrating with the teach back method. RN sent multiple flush, gauzes, and JP drainage sheet to keep record of output.  Skin is intact except as charted in most recent assessments. Pt to be escorted out by volunteer, to be driven home by family.   Adisen Bennion [employer]

## 2017-02-26 LAB — AEROBIC/ANAEROBIC CULTURE W GRAM STAIN (SURGICAL/DEEP WOUND): Culture: NO GROWTH

## 2017-02-26 LAB — AEROBIC/ANAEROBIC CULTURE (SURGICAL/DEEP WOUND): SPECIAL REQUESTS: NORMAL

## 2017-03-01 ENCOUNTER — Ambulatory Visit: Payer: Self-pay | Admitting: General Surgery

## 2017-03-01 ENCOUNTER — Other Ambulatory Visit
Admission: RE | Admit: 2017-03-01 | Discharge: 2017-03-01 | Disposition: A | Discharge status: Home / Self Care | Payer: Self-pay | Source: Ambulatory Visit | Attending: General Surgery | Admitting: General Surgery

## 2017-03-01 ENCOUNTER — Encounter: Payer: Self-pay | Admitting: General Surgery

## 2017-03-01 DIAGNOSIS — R109 Unspecified abdominal pain: Secondary | ICD-10-CM | POA: Insufficient documentation

## 2017-03-01 LAB — COMPREHENSIVE METABOLIC PANEL
ALBUMIN: 3.7 g/dL (ref 3.5–5.0)
ALK PHOS: 49 U/L (ref 38–126)
ALT: 41 U/L (ref 17–63)
ANION GAP: 13 (ref 5–15)
AST: 22 U/L (ref 15–41)
BUN: 9 mg/dL (ref 6–20)
CALCIUM: 9.5 mg/dL (ref 8.9–10.3)
CO2: 24 mmol/L (ref 22–32)
Chloride: 102 mmol/L (ref 101–111)
Creatinine, Ser: 0.85 mg/dL (ref 0.61–1.24)
GFR calc non Af Amer: >60 mL/min (ref >60)
GLUCOSE: 106 mg/dL — AB (ref 65–99)
Potassium: 4 mmol/L (ref 3.5–5.1)
Sodium: 139 mmol/L (ref 135–145)
TOTAL PROTEIN: 8 g/dL (ref 6.5–8.1)
Total Bilirubin: 0.8 mg/dL (ref 0.3–1.2)

## 2017-03-01 LAB — CBC WITH DIFFERENTIAL/PLATELET
Basophils Absolute: 0.1 10*3/uL (ref 0–0.1)
Basophils Relative: 1 %
Eosinophils Absolute: 0.1 10*3/uL (ref 0–0.7)
Eosinophils Relative: 1 %
HCT: 42 % (ref 40.0–52.0)
HEMOGLOBIN: 14.2 g/dL (ref 13.0–18.0)
LYMPHS ABS: 1.4 10*3/uL (ref 1.0–3.6)
LYMPHS PCT: 11 %
MCH: 30.7 pg (ref 26.0–34.0)
MCHC: 33.9 g/dL (ref 32.0–36.0)
MCV: 90.7 fL (ref 80.0–100.0)
MONOS PCT: 10 %
Monocytes Absolute: 1.2 10*3/uL — ABNORMAL HIGH (ref 0.2–1.0)
NEUTROS ABS: 9.7 10*3/uL — AB (ref 1.4–6.5)
NEUTROS PCT: 77 %
Platelets: 715 10*3/uL — ABNORMAL HIGH (ref 150–440)
RBC: 4.63 MIL/uL (ref 4.40–5.90)
RDW: 13.3 % (ref 11.5–14.5)
WBC: 12.5 10*3/uL — ABNORMAL HIGH (ref 3.8–10.6)

## 2017-03-01 MED ORDER — OXYCODONE-ACETAMINOPHEN 5-325 MG PO TABS: 1.0000 | ORAL_TABLET | Freq: Four times a day (QID) | ORAL | 0 refills | Status: DC | PRN
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
## 2017-03-01 NOTE — Patient Instructions (Addendum)
We have scheduled you for a CT Scan of your Abdomen and Pelvis. This has been scheduled at 2:30 at [facility] our location. Please Check-in at 2:15AM, 15 minutes prior to your scheduled appointment. If you need to reschedule your Scan, you may do so by calling [phone].  You will need to pick up a prep kit at least 24 hours in advance of your Scan: You may pick this up at the [place] department at [facility], or [employer].  Bring a list of medications with you to your appointment and you may have nothing to eat or drink 4 hours prior to your CT Scan.    We have ordered some labs to be drawn today. Please proceed to the [facility] to have these tests completed prior to leaving today. You will check in at the registration desk in the medical mall. Please see walking directions below if needed.  We will call you with the results and next step in plan of care as soon as results are received.   Directions to Medical Mall: When leaving our office, go right. Go all of the way down to the very end of the hallway. You will have a purple wall in front of you. You will now have a tunnel to the hospital on your left hand side. Go through this tunnel and the elevators will be on your left. Go down to the 1st floor and take a slight left. The very first desk on the right hand side is the registration desk.  Please finish your antibiotics. We have also sent you home with a refill on your pain medication. Please only take the pain medication as needed for pain. If you can tolerate tyelonl and ibuprofen    We will see you back in the office as listed below:

## 2017-03-02 ENCOUNTER — Telehealth: Payer: Self-pay

## 2017-03-02 ENCOUNTER — Ambulatory Visit
Admission: RE | Admit: 2017-03-02 | Discharge: 2017-03-02 | Disposition: A | Discharge status: Home / Self Care | Payer: Self-pay | Source: Ambulatory Visit | Attending: General Surgery | Admitting: General Surgery

## 2017-03-02 DIAGNOSIS — K56609 Unspecified intestinal obstruction, unspecified as to partial versus complete obstruction: Secondary | ICD-10-CM | POA: Insufficient documentation

## 2017-03-02 DIAGNOSIS — K5720 Diverticulitis of large intestine with perforation and abscess without bleeding: Secondary | ICD-10-CM | POA: Insufficient documentation

## 2017-03-02 DIAGNOSIS — R109 Unspecified abdominal pain: Secondary | ICD-10-CM | POA: Insufficient documentation

## 2017-03-02 MED ORDER — IOPAMIDOL (ISOVUE-370) INJECTION 76%
100.0000 mL | Freq: Once | INTRAVENOUS | Status: AC | PRN
  Administered 2017-03-02: 100 mL via INTRAVENOUS

## 2017-03-02 NOTE — Telephone Encounter (Addendum)
Call made to patient at this time. Left a message stating that his CT Scan results were not looking better at this time and Dr. Adonis Huguenin would like for him to be seen in office tomorrow so that we could direct admit him. I did state that he could go to the ED tonight if things were not getting better.  Patient returned call and I explained to him to come into the office to be direct admitted due to his Ct Scan not looking better. He verbalized understanding.

## 2017-03-03 ENCOUNTER — Encounter: Payer: Self-pay | Admitting: General Surgery

## 2017-03-03 ENCOUNTER — Inpatient Hospital Stay: Payer: Medicaid Other

## 2017-03-03 ENCOUNTER — Inpatient Hospital Stay
Admission: AD | Admit: 2017-03-03 | Discharge: 2017-03-19 | DRG: 335 | Disposition: A | Discharge status: Home / Self Care | Payer: Medicaid Other | Source: Ambulatory Visit | Attending: Surgery | Admitting: Surgery

## 2017-03-03 ENCOUNTER — Inpatient Hospital Stay: Payer: Self-pay | Admitting: General Surgery

## 2017-03-03 ENCOUNTER — Ambulatory Visit (INDEPENDENT_AMBULATORY_CARE_PROVIDER_SITE_OTHER): Payer: Self-pay | Admitting: General Surgery

## 2017-03-03 ENCOUNTER — Other Ambulatory Visit: Payer: Self-pay

## 2017-03-03 DIAGNOSIS — K56609 Unspecified intestinal obstruction, unspecified as to partial versus complete obstruction: Secondary | ICD-10-CM

## 2017-03-03 DIAGNOSIS — F17210 Nicotine dependence, cigarettes, uncomplicated: Secondary | ICD-10-CM | POA: Diagnosis present

## 2017-03-03 DIAGNOSIS — K651 Peritoneal abscess: Secondary | ICD-10-CM | POA: Diagnosis present

## 2017-03-03 DIAGNOSIS — K5720 Diverticulitis of large intestine with perforation and abscess without bleeding: Secondary | ICD-10-CM

## 2017-03-03 DIAGNOSIS — K565 Intestinal adhesions [bands], unspecified as to partial versus complete obstruction: Secondary | ICD-10-CM | POA: Diagnosis present

## 2017-03-03 DIAGNOSIS — Z885 Allergy status to narcotic agent status: Secondary | ICD-10-CM | POA: Diagnosis not present

## 2017-03-03 DIAGNOSIS — Z4659 Encounter for fitting and adjustment of other gastrointestinal appliance and device: Secondary | ICD-10-CM

## 2017-03-03 DIAGNOSIS — K567 Ileus, unspecified: Secondary | ICD-10-CM

## 2017-03-03 DIAGNOSIS — K5792 Diverticulitis of intestine, part unspecified, without perforation or abscess without bleeding: Secondary | ICD-10-CM | POA: Diagnosis present

## 2017-03-03 DIAGNOSIS — Z79899 Other long term (current) drug therapy: Secondary | ICD-10-CM | POA: Diagnosis not present

## 2017-03-03 DIAGNOSIS — L0291 Cutaneous abscess, unspecified: Secondary | ICD-10-CM

## 2017-03-03 LAB — CBC WITH DIFFERENTIAL/PLATELET
BASOS PCT: 0 %
Basophils Absolute: 0.1 10*3/uL (ref 0–0.1)
EOS PCT: 1 %
Eosinophils Absolute: 0.1 10*3/uL (ref 0–0.7)
HEMATOCRIT: 45.8 % (ref 40.0–52.0)
Hemoglobin: 15.2 g/dL (ref 13.0–18.0)
Lymphocytes Relative: 8 %
Lymphs Abs: 1.4 10*3/uL (ref 1.0–3.6)
MCH: 29.8 pg (ref 26.0–34.0)
MCHC: 33.1 g/dL (ref 32.0–36.0)
MCV: 90.1 fL (ref 80.0–100.0)
MONO ABS: 1.4 10*3/uL — AB (ref 0.2–1.0)
MONOS PCT: 8 %
NEUTROS ABS: 13.7 10*3/uL — AB (ref 1.4–6.5)
Neutrophils Relative %: 83 %
PLATELETS: 701 10*3/uL — AB (ref 150–440)
RBC: 5.08 MIL/uL (ref 4.40–5.90)
RDW: 13 % (ref 11.5–14.5)
WBC: 16.7 10*3/uL — ABNORMAL HIGH (ref 3.8–10.6)

## 2017-03-03 LAB — COMPREHENSIVE METABOLIC PANEL
ALBUMIN: 4.1 g/dL (ref 3.5–5.0)
ALT: 25 U/L (ref 17–63)
AST: 14 U/L — ABNORMAL LOW (ref 15–41)
Alkaline Phosphatase: 54 U/L (ref 38–126)
Anion gap: 12 (ref 5–15)
BILIRUBIN TOTAL: 0.9 mg/dL (ref 0.3–1.2)
BUN: 11 mg/dL (ref 6–20)
CHLORIDE: 96 mmol/L — AB (ref 101–111)
CO2: 25 mmol/L (ref 22–32)
Calcium: 9.8 mg/dL (ref 8.9–10.3)
Creatinine, Ser: 0.91 mg/dL (ref 0.61–1.24)
GFR calc Af Amer: >60 mL/min (ref >60)
GFR calc non Af Amer: >60 mL/min (ref >60)
Glucose, Bld: 105 mg/dL — ABNORMAL HIGH (ref 65–99)
POTASSIUM: 3.7 mmol/L (ref 3.5–5.1)
Sodium: 133 mmol/L — ABNORMAL LOW (ref 135–145)
TOTAL PROTEIN: 8.9 g/dL — AB (ref 6.5–8.1)

## 2017-03-03 LAB — PROTIME-INR
INR: 1.11
PROTHROMBIN TIME: 14.2 s (ref 11.4–15.2)

## 2017-03-03 LAB — MAGNESIUM: MAGNESIUM: 2.1 mg/dL (ref 1.7–2.4)

## 2017-03-03 LAB — PHOSPHORUS: Phosphorus: 4.6 mg/dL (ref 2.5–4.6)

## 2017-03-03 MED ORDER — SODIUM CHLORIDE 0.9% FLUSH: 5.0000 mL | Freq: Three times a day (TID) | INTRAVENOUS | Status: DC

## 2017-03-03 MED ORDER — MORPHINE SULFATE (PF) 4 MG/ML IV SOLN
4.0000 mg | INTRAVENOUS | Status: DC | PRN
  Administered 2017-03-03: 4 mg via INTRAVENOUS
  Filled 2017-03-03: qty 1

## 2017-03-03 MED ORDER — ONDANSETRON 4 MG PO TBDP: 4.0000 mg | ORAL_TABLET | Freq: Four times a day (QID) | ORAL | Status: DC | PRN

## 2017-03-03 MED ORDER — MIDAZOLAM HCL 2 MG/2ML IJ SOLN
INTRAMUSCULAR | Status: AC | PRN
  Administered 2017-03-03 (×2): 2 mg via INTRAVENOUS

## 2017-03-03 MED ORDER — DIPHENHYDRAMINE HCL 25 MG PO CAPS: 25.0000 mg | ORAL_CAPSULE | Freq: Four times a day (QID) | ORAL | Status: DC | PRN

## 2017-03-03 MED ORDER — ONDANSETRON HCL 4 MG/2ML IJ SOLN
4.0000 mg | Freq: Four times a day (QID) | INTRAMUSCULAR | Status: DC | PRN
  Administered 2017-03-10 (×2): 4 mg via INTRAVENOUS

## 2017-03-03 MED ORDER — HYDROMORPHONE HCL 1 MG/ML IJ SOLN
INTRAMUSCULAR | Status: AC | PRN
  Administered 2017-03-03 (×2): 0.5 mg via INTRAVENOUS

## 2017-03-03 MED ORDER — HYDROMORPHONE HCL 1 MG/ML IJ SOLN
1.0000 mg | INTRAMUSCULAR | Status: DC | PRN
  Administered 2017-03-03 – 2017-03-07 (×46): 1 mg via INTRAVENOUS
  Filled 2017-03-03 (×47): qty 1

## 2017-03-03 MED ORDER — FENTANYL CITRATE (PF) 100 MCG/2ML IJ SOLN
INTRAMUSCULAR | Status: AC
  Filled 2017-03-03: qty 2

## 2017-03-03 MED ORDER — HYDRALAZINE HCL 20 MG/ML IJ SOLN: 10.0000 mg | INTRAMUSCULAR | Status: DC | PRN

## 2017-03-03 MED ORDER — LACTATED RINGERS IV SOLN
INTRAVENOUS | Status: DC
  Administered 2017-03-03 – 2017-03-06 (×9): via INTRAVENOUS

## 2017-03-03 MED ORDER — SODIUM CHLORIDE 0.9% FLUSH
5.0000 mL | Freq: Three times a day (TID) | INTRAVENOUS | Status: DC
  Administered 2017-03-03 – 2017-03-16 (×37): 5 mL

## 2017-03-03 MED ORDER — PIPERACILLIN-TAZOBACTAM 3.375 G IVPB
3.3750 g | Freq: Three times a day (TID) | INTRAVENOUS | Status: DC
  Administered 2017-03-03 – 2017-03-06 (×9): 3.375 g via INTRAVENOUS
  Filled 2017-03-03 (×9): qty 50

## 2017-03-03 MED ORDER — HYDROMORPHONE HCL 1 MG/ML IJ SOLN
INTRAMUSCULAR | Status: AC
  Filled 2017-03-03: qty 1

## 2017-03-03 MED ORDER — DIPHENHYDRAMINE HCL 50 MG/ML IJ SOLN: 25.0000 mg | Freq: Four times a day (QID) | INTRAMUSCULAR | Status: DC | PRN

## 2017-03-03 MED ORDER — MIDAZOLAM HCL 2 MG/2ML IJ SOLN
INTRAMUSCULAR | Status: AC
  Filled 2017-03-03: qty 4

## 2017-03-03 MED ORDER — PANTOPRAZOLE SODIUM 40 MG IV SOLR
40.0000 mg | Freq: Every day | INTRAVENOUS | Status: DC
  Administered 2017-03-03 – 2017-03-18 (×16): 40 mg via INTRAVENOUS
  Filled 2017-03-03 (×16): qty 40

## 2017-03-03 NOTE — Procedures (Signed)
Pre procedural Dx: Diverticular Abscess Post procedural Dx: Same  Technically successful CT guided placed of a 10 Fr drainage catheter placement into the pelvis via L TG approach yielding 50 cc of purulent, blood tinged fluid.    All aspirated samples sent to the laboratory for analysis.    EBL: Minimal  Complications: None immediate  Ronny Bacon, MD Pager #: [phone]

## 2017-03-03 NOTE — Progress Notes (Signed)
Patient ID: Martin Garcia, male   DOB: [birth year], 32 y.o.   MRN: [id number]      Referring Physician(s): Burt Knack  Patient Status:  [facility] - In-pt  Chief Complaint:  New diverticular abscess  Subjective:  This is a 32 year old male who is known to the interventional radiology service after undergoing right transgluteal approach percutaneous diverticular abscess drainage catheter placement by myself on 02/21/2017.  Unfortunately, the patient's symptoms have persisted with repeat imaging demonstrating development of a new diverticular abscess within the left hemipelvis. As such, request is made for placement of a new percutaneous drainage catheter.  Patient continues complaining of abdominal pain and distention. He denies chest pain or shortness of breath.  Allergies: Tramadol  Medications: Prior to Admission medications   Medication Sig Start Date End Date Taking? Authorizing Provider  amoxicillin-clavulanate (AUGMENTIN) 875-125 MG tablet Take 1 tablet by mouth every 12 (twelve) hours. 02/24/17  Yes Clayburn Pert, MD  ibuprofen (ADVIL,MOTRIN) 200 MG tablet Take 200 mg by mouth every 6 (six) hours as needed.   Yes [provider]  oxyCODONE-acetaminophen (PERCOCET/ROXICET) 5-325 MG tablet Take 1-2 tablets by mouth every 6 (six) hours as needed for moderate pain or severe pain. 03/01/17  Yes Clayburn Pert, MD     Vital Signs: BP (!) 148/101 (BP Location: Right Arm)   Pulse (!) 125   Temp 98 F (36.7 C) (Oral)   Resp 17   Ht 6' (1.829 m)   Wt 166 lb 0.1 oz (75.3 kg)   SpO2 100%   BMI 22.51 kg/m   Physical Exam  Constitutional: He appears well-developed and well-nourished.  HENT:  Head: Normocephalic and atraumatic.  Cardiovascular: Regular rhythm.  Tachycardia.  Pulmonary/Chest: Effort normal and breath sounds normal.  Abdominal: There is tenderness.  The abdomen appears distended.  Nursing note and vitals reviewed.   Imaging: Ct Abdomen Pelvis W  Contrast  Result Date: 03/03/2017 CLINICAL DATA:  Followup diverticulitis with abscess, and small bowel obstruction. EXAM: CT ABDOMEN AND PELVIS WITH CONTRAST TECHNIQUE: Multidetector CT imaging of the abdomen and pelvis was performed using the standard protocol following bolus administration of intravenous contrast. CONTRAST:  155mL ISOVUE-370 IOPAMIDOL (ISOVUE-370) INJECTION 76% COMPARISON:  02/21/2017 FINDINGS: Lower Chest: No acute findings. Hepatobiliary: No hepatic masses identified. Gallbladder is unremarkable. Pancreas:  No mass or inflammatory changes. Spleen: Within normal limits in size and appearance. Adrenals/Urinary Tract: No masses identified. No evidence of hydronephrosis. Unremarkable unopacified urinary bladder. Stomach/Bowel: There has been interval improvement in sigmoid diverticulitis since previous study. There has been interval placement of a right transgluteal percutaneous drainage catheter, with resolution of previously seen rim enhancing fluid collection along right pelvic sidewall. A new rim enhancing fluid collection which contains a small amount of gas is seen in the central sigmoid mesocolon which measures 5.3 x 4.3 cm, consistent with abscess. No other abscess identified. Moderately dilated small bowel loops show mild decrease in dilatation since prior study. Transition point is seen central small bowel mesentery in the lower abdomen, likely due to adhesion. Colon is nondilated. Normal appendix visualized. Vascular/Lymphatic: No pathologically enlarged lymph nodes. No abdominal aortic aneurysm. Reproductive:  No mass or other significant abnormality. Other:  None. Musculoskeletal:  No suspicious bone lesions identified. IMPRESSION: Interval improvement in sigmoid diverticulitis. New abscess in central sigmoid mesocolon measuring 5.3 x 4.3 cm. Persistent distal small bowel obstruction, with transition point in lower abdominal small bowel mesentery likely due to adhesion.  Electronically Signed   By: Sharrie Rothman.D.  On: 03/03/2017 08:33    Labs:  CBC: Recent Labs    02/22/17 0727 02/23/17 0554 03/01/17 1015 03/03/17 1329  WBC 9.6 9.8 12.5* 16.7*  HGB 13.3 13.8 14.2 15.2  HCT 38.4* 39.8* 42.0 45.8  PLT 335 373 715* 701*    COAGS: Recent Labs    02/21/17 0855 03/03/17 1329  INR 1.07 1.11    BMP: Recent Labs    02/22/17 0727 02/23/17 0554 03/01/17 1015 03/03/17 1329  NA 133* 136 139 133*  K 3.5 3.5 4.0 3.7  CL 96* 99* 102 96*  CO2 28 28 24 25   GLUCOSE 113* 107* 106* 105*  BUN 6 <5* 9 11  CALCIUM 8.7* 8.7* 9.5 9.8  CREATININE 0.88 0.83 0.85 0.91  GFRNONAA >60 >60 >60 >60  GFRAA >60 >60 >60 >60    LIVER FUNCTION TESTS: Recent Labs    02/16/17 1213 02/18/17 1725 03/01/17 1015 03/03/17 1329  BILITOT 0.9 0.9 0.8 0.9  AST 20 12* 22 14*  ALT 22 13* 41 25  ALKPHOS 54 56 49 54  PROT 9.0* 8.8* 8.0 8.9*  ALBUMIN 4.8 4.2 3.7 4.1    Assessment and Plan:  This is a 32 year old male who is known to the interventional radiology service after undergoing right transgluteal approach percutaneous diverticular abscess drainage catheter placement by myself on 02/21/2017.  Unfortunately, the patient's symptoms have persisted with repeat imaging demonstrating development of a new diverticular abscess within the left hemipelvis. As such, request is made for placement of a new percutaneous drainage catheter.  Risks and benefits of left sided transgluteal approach drainage catheter placement was discussed with the patient including bleeding, infection, damage to adjacent structures, bowel perforation/fistula connection, and sepsis.  All of the patient's questions were answered, patient is agreeable to proceed.  Consent signed and in chart.  Electronically Signed: Sandi Mariscal, MD 03/03/2017, 4:06 PM   I spent a total of 15 Minutes at the the patient's bedside AND on the patient's hospital floor or unit, greater than 50% of which was  counseling/coordinating care for CT guided drain placement.

## 2017-03-03 NOTE — Telephone Encounter (Signed)
Spoke with Martin Garcia at this time direct admit.  No rooms available at this time, however he will call when a room is available.

## 2017-03-03 NOTE — Telephone Encounter (Signed)
Spoke with Hilliard Clark from [facility] he advised that he will call once a bed is available for patient. I stated to him that if he doesn't speak to me directly then to leave the message with another staff member. He verbalized understanding.

## 2017-03-03 NOTE — Progress Notes (Signed)
Met with patient and discussed with Dr. Lanetta Inch earlier today.  Patient is quite adamant about avoiding a colostomy if at all possible.  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  He does not want to miss the requisite estimated 2 to 3-4 weeks prior to closure of a colostomy.  He understands that he will require a colon resection at some point but if he could avoid a colostomy he is very adamant against that and wants to try to avoid a colostomy.  We discussed CT-guided drainage of his other abscess.  He is quite tender and one could easily justify a Hartman's procedure at this point.  Patient is quite sure that he would like to try the less invasive route at this point in hopes of avoiding 2 operations.

## 2017-03-03 NOTE — Patient Instructions (Signed)
We will send you from our office to the Registration Desk in the Medical Mall for Direct Admission. They will get you registered and take you to your room in the hospital. Please do not eat or drink anything until you speak with your nurse or Surgeon at the hospital.  Directions to Medical Mall: When leaving our office, go right. Go all of the way down to the very end of the hallway. You will have a purple wall in front of you. You will now have a tunnel to the hospital on your left hand side. Go through this tunnel and the elevators will be on your left. Go down to the 1st floor and take a slight left. The very first desk on the right hand side is the registration desk.  
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
## 2017-03-03 NOTE — Progress Notes (Signed)
Patient requesting ice.  Dr Burt Knack notified.  Order received for a clear liquid diet and npo after midnight
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
## 2017-03-03 NOTE — Progress Notes (Signed)
Patient transported to intervention radiology for drain placement

## 2017-03-04 DIAGNOSIS — L0291 Cutaneous abscess, unspecified: Secondary | ICD-10-CM

## 2017-03-04 LAB — CBC WITH DIFFERENTIAL/PLATELET
BASOS ABS: 0 10*3/uL (ref 0–0.1)
BASOS PCT: 1 %
Eosinophils Absolute: 0.1 10*3/uL (ref 0–0.7)
Eosinophils Relative: 1 %
HEMATOCRIT: 38 % — AB (ref 40.0–52.0)
HEMOGLOBIN: 13 g/dL (ref 13.0–18.0)
LYMPHS PCT: 12 %
Lymphs Abs: 1.1 10*3/uL (ref 1.0–3.6)
MCH: 30.8 pg (ref 26.0–34.0)
MCHC: 34.3 g/dL (ref 32.0–36.0)
MCV: 89.7 fL (ref 80.0–100.0)
Monocytes Absolute: 0.8 10*3/uL (ref 0.2–1.0)
Monocytes Relative: 9 %
NEUTROS ABS: 7.2 10*3/uL — AB (ref 1.4–6.5)
NEUTROS PCT: 77 %
Platelets: 537 10*3/uL — ABNORMAL HIGH (ref 150–440)
RBC: 4.23 MIL/uL — AB (ref 4.40–5.90)
RDW: 13 % (ref 11.5–14.5)
WBC: 9.3 10*3/uL (ref 3.8–10.6)

## 2017-03-04 LAB — BASIC METABOLIC PANEL
Anion gap: 8 (ref 5–15)
BUN: 8 mg/dL (ref 6–20)
CHLORIDE: 99 mmol/L — AB (ref 101–111)
CO2: 28 mmol/L (ref 22–32)
Calcium: 8.8 mg/dL — ABNORMAL LOW (ref 8.9–10.3)
Creatinine, Ser: 0.89 mg/dL (ref 0.61–1.24)
GFR calc non Af Amer: >60 mL/min (ref >60)
Glucose, Bld: 102 mg/dL — ABNORMAL HIGH (ref 65–99)
POTASSIUM: 4.5 mmol/L (ref 3.5–5.1)
SODIUM: 135 mmol/L (ref 135–145)

## 2017-03-04 LAB — PHOSPHORUS: PHOSPHORUS: 3.6 mg/dL (ref 2.5–4.6)

## 2017-03-04 LAB — MAGNESIUM: MAGNESIUM: 1.8 mg/dL (ref 1.7–2.4)

## 2017-03-04 NOTE — Progress Notes (Signed)
Dressing changed to R&L buttock dressings aspetically, new sterile aguze then occlusive tegaderm applied. Pt tol. Well. No redness, drainage or odor at either tube insertion sites.

## 2017-03-04 NOTE — Progress Notes (Signed)
CC: Acute diverticulitis Subjective: Patient feels better today he had his drainage procedure performed last night.  He has no nausea vomiting and had some loose stools.  No fevers or chills  Objective: Vital signs in last 24 hours: Temp:  [97.5 F (36.4 C)-98.8 F (37.1 C)] 98 F (36.7 C) (01/19 0440) Pulse Rate:  [105-138] 105 (01/19 0440) Resp:  [17-30] 18 (01/19 0440) BP: (120-159)/(65-110) 140/81 (01/19 0440) SpO2:  [94 %-100 %] 96 % (01/19 0440) Weight:  [166 lb 0.1 oz (75.3 kg)-166 lb 12.8 oz (75.7 kg)] 166 lb 0.1 oz (75.3 kg) (01/18 1324) Last BM Date: 03/02/17  Intake/Output from previous day: 01/18 0701 - 01/19 0700 In: 1612 [I.V.:1512; IV Piggyback:100] Out: 660 [Urine:625; Drains:35] Intake/Output this shift: Total I/O In: 427 [I.V.:427] Out: -   Physical exam:  Vital signs are stable and afebrile abdomen is soft still distended and still tympanitic but minimally tender in the left lower quadrant without peritoneal signs drains are in place serous on the right serosanguineous on the left  Lab Results: CBC  Recent Labs    03/03/17 1329 03/04/17 0344  WBC 16.7* 9.3  HGB 15.2 13.0  HCT 45.8 38.0*  PLT 701* 537*   BMET Recent Labs    03/03/17 1329 03/04/17 0344  NA 133* 135  K 3.7 4.5  CL 96* 99*  CO2 25 28  GLUCOSE 105* 102*  BUN 11 8  CREATININE 0.91 0.89  CALCIUM 9.8 8.8*   PT/INR Recent Labs    03/03/17 1329  LABPROT 14.2  INR 1.11   ABG No results for input(s): PHART, HCO3 in the last 72 hours.  Invalid input(s): PCO2, PO2  Studies/Results: Ct Abdomen Pelvis W Contrast  Result Date: 03/03/2017 CLINICAL DATA:  Followup diverticulitis with abscess, and small bowel obstruction. EXAM: CT ABDOMEN AND PELVIS WITH CONTRAST TECHNIQUE: Multidetector CT imaging of the abdomen and pelvis was performed using the standard protocol following bolus administration of intravenous contrast. CONTRAST:  188mL ISOVUE-370 IOPAMIDOL (ISOVUE-370) INJECTION  76% COMPARISON:  02/21/2017 FINDINGS: Lower Chest: No acute findings. Hepatobiliary: No hepatic masses identified. Gallbladder is unremarkable. Pancreas:  No mass or inflammatory changes. Spleen: Within normal limits in size and appearance. Adrenals/Urinary Tract: No masses identified. No evidence of hydronephrosis. Unremarkable unopacified urinary bladder. Stomach/Bowel: There has been interval improvement in sigmoid diverticulitis since previous study. There has been interval placement of a right transgluteal percutaneous drainage catheter, with resolution of previously seen rim enhancing fluid collection along right pelvic sidewall. A new rim enhancing fluid collection which contains a small amount of gas is seen in the central sigmoid mesocolon which measures 5.3 x 4.3 cm, consistent with abscess. No other abscess identified. Moderately dilated small bowel loops show mild decrease in dilatation since prior study. Transition point is seen central small bowel mesentery in the lower abdomen, likely due to adhesion. Colon is nondilated. Normal appendix visualized. Vascular/Lymphatic: No pathologically enlarged lymph nodes. No abdominal aortic aneurysm. Reproductive:  No mass or other significant abnormality. Other:  None. Musculoskeletal:  No suspicious bone lesions identified. IMPRESSION: Interval improvement in sigmoid diverticulitis. New abscess in central sigmoid mesocolon measuring 5.3 x 4.3 cm. Persistent distal small bowel obstruction, with transition point in lower abdominal small bowel mesentery likely due to adhesion. Electronically Signed   By: Earle Gell M.D.   On: 03/03/2017 08:33   Ct Image Guided Drainage Percut Cath  Peritoneal Retroperit  Result Date: 03/03/2017 INDICATION: History of diverticular abscess, post right-sided percutaneous drainage catheter placement on  02/21/2017 Unfortunately, the patient has continued to experience infectious symptoms including elevated white blood cell count  with subsequent CT scan the abdomen pelvis demonstrates development of a new fluid collection within the left hemipelvis and as such, request made for placement of an additional left-sided trans gluteal approach percutaneous drainage catheter for infection source control purposes. EXAM: CT-GUIDED LEFT TRANS GLUTEAL APPROACH PERCUTANEOUS DRAINAGE CATHETER PLACEMENT COMPARISON:  CT abdomen pelvis - 03/03/2027; 02/21/2017; CT-guided right trans gluteal approach percutaneous drainage catheter placement - 02/21/2017 MEDICATIONS: The patient is currently admitted to the hospital and receiving intravenous antibiotics. The antibiotics were administered within an appropriate time frame prior to the initiation of the procedure. ANESTHESIA/SEDATION: Moderate (conscious) sedation was employed during this procedure. A total of Versed 4 mg and Dilaudid 1 mg was administered intravenously. Moderate Sedation Time: 20 minutes. The patient's level of consciousness and vital signs were monitored continuously by radiology nursing throughout the procedure under my direct supervision. CONTRAST:  None COMPLICATIONS: None immediate. PROCEDURE: Informed written consent was obtained from the patient after a discussion of the risks, benefits and alternatives to treatment. The patient was placed prone on the CT gantry and a pre procedural CT was performed re-demonstrating the known abscess/fluid collection within the left hemipelvis with dominant ill-defined component measuring approximately 6.1 x 3.4 cm (image 33, series 2). The procedure was planned. A timeout was performed prior to the initiation of the procedure. The left buttocks was prepped and draped in the usual sterile fashion. The overlying soft tissues were anesthetized with 1% lidocaine with epinephrine. Appropriate trajectory was planned with the use of a 22 gauge spinal needle. An 18 gauge trocar needle was advanced into the abscess/fluid collection and a short Amplatz super  stiff wire was coiled within the collection. Appropriate positioning was confirmed with a limited CT scan. The tract was serially dilated allowing placement of a 10 [place] all-purpose drainage catheter. Appropriate positioning was confirmed with a limited postprocedural CT scan. Approximately 50 ml of purulent fluid was aspirated. The tube was connected to a drainage bag and sutured in place. A dressing was placed. The patient tolerated the procedure well without immediate post procedural complication. IMPRESSION: Successful CT guided placement of a 10 French all purpose drain catheter into the left hemi pelvis via left trans gluteal approach with aspiration of 50 mL of purulent fluid. Samples were sent to the laboratory as requested by the ordering clinical team. Electronically Signed   By: Sandi Mariscal M.D.   On: 03/03/2017 17:15    Anti-infectives: Anti-infectives (From admission, onward)   Start     Dose/Rate Route Frequency Ordered Stop   03/03/17 1300  piperacillin-tazobactam (ZOSYN) IVPB 3.375 g     3.375 g 12.5 mL/hr over 240 Minutes Intravenous Every 8 hours 03/03/17 1256        Assessment/Plan:  White blood cell count is normalized following procedure.  Overall patient feels better and will continue IV antibiotics for now  Florene Glen, MD, FACS  03/04/2017

## 2017-03-05 MED ORDER — OXYCODONE-ACETAMINOPHEN 7.5-325 MG PO TABS
1.0000 | ORAL_TABLET | ORAL | Status: DC | PRN
Start: 2017-03-05 — End: 2017-03-07
  Administered 2017-03-05 – 2017-03-07 (×12): 1 via ORAL
  Filled 2017-03-05 (×12): qty 1

## 2017-03-05 NOTE — Progress Notes (Signed)
Walked in halls. Taking Dilaudid Q2h. The Percocet is helping him some-he's taking it Q4h (see MAR). Pt concerned L abscess tube not draining, I milked it gently after irrigating. No BM this shift. Abd less distended than yesterday.

## 2017-03-05 NOTE — Progress Notes (Signed)
CC: Acute diverticulitis with abscess Subjective: This patient with acute diverticulitis and abscess.  He is feeling better but still having some left lower quadrant pain.  He is requiring Dilaudid.  Gust attempting oral analgesics as an alternative.  Objective: Vital signs in last 24 hours: Temp:  [98.1 F (36.7 C)-99.3 F (37.4 C)] 98.2 F (36.8 C) (01/20 0600) Pulse Rate:  [90-109] 90 (01/20 0600) Resp:  [18-20] 18 (01/20 0600) BP: (137-142)/(88-96) 137/88 (01/20 0600) SpO2:  [96 %-97 %] 97 % (01/20 0600) Last BM Date: 03/04/17  Intake/Output from previous day: 01/19 0701 - 01/20 0700 In: 3854 [P.O.:750; I.V.:3034; IV Piggyback:45] Out: 7564 [Urine:3875; Drains:64; Stool:2] Intake/Output this shift: Total I/O In: 408 [P.O.:400; Other:8] Out: 332 [RJJOA:416]  Physical exam:  Abdomen is soft but distended and tender in the left lower quadrant.  Awake alert and oriented.  Vital signs are stable low-grade temp of 99.3  Lab Results: CBC  Recent Labs    03/03/17 1329 03/04/17 0344  WBC 16.7* 9.3  HGB 15.2 13.0  HCT 45.8 38.0*  PLT 701* 537*   BMET Recent Labs    03/03/17 1329 03/04/17 0344  NA 133* 135  K 3.7 4.5  CL 96* 99*  CO2 25 28  GLUCOSE 105* 102*  BUN 11 8  CREATININE 0.91 0.89  CALCIUM 9.8 8.8*   PT/INR Recent Labs    03/03/17 1329  LABPROT 14.2  INR 1.11   ABG No results for input(s): PHART, HCO3 in the last 72 hours.  Invalid input(s): PCO2, PO2  Studies/Results: Ct Image Guided Drainage Percut Cath  Peritoneal Retroperit  Result Date: 03/03/2017 INDICATION: History of diverticular abscess, post right-sided percutaneous drainage catheter placement on 02/21/2017 Unfortunately, the patient has continued to experience infectious symptoms including elevated white blood cell count with subsequent CT scan the abdomen pelvis demonstrates development of a new fluid collection within the left hemipelvis and as such, request made for placement of an  additional left-sided trans gluteal approach percutaneous drainage catheter for infection source control purposes. EXAM: CT-GUIDED LEFT TRANS GLUTEAL APPROACH PERCUTANEOUS DRAINAGE CATHETER PLACEMENT COMPARISON:  CT abdomen pelvis - 03/03/2027; 02/21/2017; CT-guided right trans gluteal approach percutaneous drainage catheter placement - 02/21/2017 MEDICATIONS: The patient is currently admitted to the hospital and receiving intravenous antibiotics. The antibiotics were administered within an appropriate time frame prior to the initiation of the procedure. ANESTHESIA/SEDATION: Moderate (conscious) sedation was employed during this procedure. A total of Versed 4 mg and Dilaudid 1 mg was administered intravenously. Moderate Sedation Time: 20 minutes. The patient's level of consciousness and vital signs were monitored continuously by radiology nursing throughout the procedure under my direct supervision. CONTRAST:  None COMPLICATIONS: None immediate. PROCEDURE: Informed written consent was obtained from the patient after a discussion of the risks, benefits and alternatives to treatment. The patient was placed prone on the CT gantry and a pre procedural CT was performed re-demonstrating the known abscess/fluid collection within the left hemipelvis with dominant ill-defined component measuring approximately 6.1 x 3.4 cm (image 33, series 2). The procedure was planned. A timeout was performed prior to the initiation of the procedure. The left buttocks was prepped and draped in the usual sterile fashion. The overlying soft tissues were anesthetized with 1% lidocaine with epinephrine. Appropriate trajectory was planned with the use of a 22 gauge spinal needle. An 18 gauge trocar needle was advanced into the abscess/fluid collection and a short Amplatz super stiff wire was coiled within the collection. Appropriate positioning was confirmed with a  limited CT scan. The tract was serially dilated allowing placement of a 10 [place]  all-purpose drainage catheter. Appropriate positioning was confirmed with a limited postprocedural CT scan. Approximately 50 ml of purulent fluid was aspirated. The tube was connected to a drainage bag and sutured in place. A dressing was placed. The patient tolerated the procedure well without immediate post procedural complication. IMPRESSION: Successful CT guided placement of a 10 French all purpose drain catheter into the left hemi pelvis via left trans gluteal approach with aspiration of 50 mL of purulent fluid. Samples were sent to the laboratory as requested by the ordering clinical team. Electronically Signed   By: Sandi Mariscal M.D.   On: 03/03/2017 17:15    Anti-infectives: Anti-infectives (From admission, onward)   Start     Dose/Rate Route Frequency Ordered Stop   03/03/17 1300  piperacillin-tazobactam (ZOSYN) IVPB 3.375 g     3.375 g 12.5 mL/hr over 240 Minutes Intravenous Every 8 hours 03/03/17 1256        Assessment/Plan:  White blood cell count has normalized.  Will attempt to adjust pain medications but for right now continue IV antibiotics.  He understands that if he does not improve considerably he would likely require a Hartmann's procedure.  He understands that but does not want it  Florene Glen, MD, FACS  03/05/2017

## 2017-03-06 DIAGNOSIS — K5792 Diverticulitis of intestine, part unspecified, without perforation or abscess without bleeding: Secondary | ICD-10-CM

## 2017-03-06 MED ORDER — AMOXICILLIN-POT CLAVULANATE 875-125 MG PO TABS
1.0000 | ORAL_TABLET | Freq: Two times a day (BID) | ORAL | Status: DC
  Administered 2017-03-06 – 2017-03-08 (×5): 1 via ORAL
  Filled 2017-03-06 (×5): qty 1

## 2017-03-06 MED ORDER — POLYETHYLENE GLYCOL 3350 17 G PO PACK
17.0000 g | PACK | Freq: Every day | ORAL | Status: DC | PRN
  Administered 2017-03-06: 17 g via ORAL
  Filled 2017-03-06: qty 1

## 2017-03-06 NOTE — H&P (Signed)
03/06/17  Patient was admitted on 1/18 directly from the office by Dr. Adonis Huguenin for persistent diverticulitis with a new abscess and elevated WBC.  Please see his office note on 03/03/17 for H&P   Olean Ree, MD

## 2017-03-06 NOTE — Care Management (Signed)
Previous discharge on 02/24/17 Patient was provided with application to Medication Management, [place], and "[facility] Network:  Your Guide to [employer] and [employer] in [facility]"  Booklet.  Patient to discharge with rx for Augmentin goodrx coupon printed for [employer].  RNCM following for discharge needs

## 2017-03-06 NOTE — Progress Notes (Signed)
03/06/2017  Subjective: No acute events.  Patient has been tolerating a full liquid diet and though he felt a bit bloated, he reports his abdomen is softer and less tender compared to admission.  Denies any fevers, nausea, or vomiting.  Vital signs: Temp:  [97.6 F (36.4 C)-98.3 F (36.8 C)] 98.3 F (36.8 C) (01/21 1153) Pulse Rate:  [78-106] 88 (01/21 1153) Resp:  [18-20] 20 (01/21 1153) BP: (122-149)/(90-94) 122/92 (01/21 1153) SpO2:  [97 %-98 %] 98 % (01/21 1153)   Intake/Output: 01/20 0701 - 01/21 0700 In: 0973 [P.O.:1480; I.V.:2826; IV Piggyback:50] Out: 3400 [ZHGDJ:2426; Drains:25] Last BM Date: 03/04/17  Physical Exam: Constitutional: No acute distress Abdomen:  Soft, mildly distended, with only mild discomfort to deep pelvis palpation.  Bilateral drains in place.  Right drain with serous fluid, left drain with serosanguinous fluid.  Labs:  Recent Labs    03/03/17 1329 03/04/17 0344  WBC 16.7* 9.3  HGB 15.2 13.0  HCT 45.8 38.0*  PLT 701* 537*   Recent Labs    03/03/17 1329 03/04/17 0344  NA 133* 135  K 3.7 4.5  CL 96* 99*  CO2 25 28  GLUCOSE 105* 102*  BUN 11 8  CREATININE 0.91 0.89  CALCIUM 9.8 8.8*   Recent Labs    03/03/17 1329  LABPROT 14.2  INR 1.11    Imaging: No results found.  Assessment/Plan: 32 yo male with diverticulitis with new abscess  --Will advance the patient's diet to soft for lunch today. --transition to oral antibiotics --discontinue IV fluids --continue both drains in place. --discussed with patient that possibly could be discharged tomorrow depending on how he progresses.  He would leave with both drains in place.   Melvyn Neth, [place]

## 2017-03-06 NOTE — Progress Notes (Signed)
Received call from patient that he was bleeding from drain insertion site.  Right drain dressing noted to be saturated with blood.  Dressing changed and clotted blood seen around drain insertion sight.  New dressing applied.  Per Dr. Hampton Abbot will continue to monitor.
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
## 2017-03-07 MED ORDER — BISACODYL 10 MG RE SUPP
10.0000 mg | Freq: Once | RECTAL | Status: AC
  Administered 2017-03-07: 10 mg via RECTAL
  Filled 2017-03-07: qty 1

## 2017-03-07 MED ORDER — POLYETHYLENE GLYCOL 3350 17 G PO PACK
17.0000 g | PACK | Freq: Every day | ORAL | Status: DC
  Administered 2017-03-07 – 2017-03-16 (×3): 17 g via ORAL
  Filled 2017-03-07 (×5): qty 1

## 2017-03-07 MED ORDER — OXYCODONE-ACETAMINOPHEN 7.5-325 MG PO TABS
1.0000 | ORAL_TABLET | ORAL | Status: DC | PRN
  Administered 2017-03-07 – 2017-03-08 (×3): 2 via ORAL
  Administered 2017-03-08: 1 via ORAL
  Administered 2017-03-08 – 2017-03-10 (×10): 2 via ORAL
  Filled 2017-03-07 (×7): qty 2
  Filled 2017-03-07: qty 1
  Filled 2017-03-07 (×6): qty 2

## 2017-03-07 MED ORDER — HYDROMORPHONE HCL 1 MG/ML IJ SOLN
1.0000 mg | Freq: Four times a day (QID) | INTRAMUSCULAR | Status: DC | PRN
  Administered 2017-03-07 – 2017-03-09 (×8): 1 mg via INTRAVENOUS
  Administered 2017-03-10: .1 mg via INTRAVENOUS
  Administered 2017-03-10 (×2): .2 mg via INTRAVENOUS
  Administered 2017-03-10: 1 mg via INTRAVENOUS
  Administered 2017-03-10: .3 mg via INTRAVENOUS
  Administered 2017-03-10: 1 mg via INTRAVENOUS
  Administered 2017-03-10: .2 mg via INTRAVENOUS
  Filled 2017-03-07 (×11): qty 1

## 2017-03-07 NOTE — Progress Notes (Signed)
03/07/2017  Subjective: Patient had some bleeding around right drain.  No bowel movement yet but having some flatus.  Still feels distended.  Vital signs: Temp:  [97.7 F (36.5 C)-98.3 F (36.8 C)] 97.7 F (36.5 C) (01/22 1337) Pulse Rate:  [86-94] 87 (01/22 1337) Resp:  [20] 20 (01/22 0412) BP: (125-135)/(86-91) 132/86 (01/22 1337) SpO2:  [97 %-98 %] 98 % (01/22 1337)   Intake/Output: 01/21 0701 - 01/22 0700 In: 328 [I.V.:284; IV Piggyback:34] Out: 2570 [Urine:2550; Drains:20] Last BM Date: 03/04/17  Physical Exam: Constitutional: No acute distress Abdomen:  Soft, distended, nontender.  There is a small hematoma around the right drain, left drain intact.  Both with serosanguinous fluid.  Labs:  No results for input(s): WBC, HGB, HCT, PLT in the last 72 hours. No results for input(s): NA, K, CL, CO2, GLUCOSE, BUN, CREATININE, CALCIUM in the last 72 hours.  Invalid input(s): MAGNESIUM No results for input(s): LABPROT, INR in the last 72 hours.  Imaging: No results found.  Assessment/Plan: 32 yo male with diverticulitis with absces  --placed pressure dressing around right drain.  Currently not flushing well so will just leave to drainage. --continue abx --will give miralax and dulcolax today.  Possible discharge today if feels better from GI standpoint.   Melvyn Neth, [place]

## 2017-03-07 NOTE — Progress Notes (Signed)
Pt ambulated around nursing station three times and tolerated ambulation well. When pt arrived back to room pt.'s right catheter dressing was saturated with bright red blood. RN is not able to flush any into right side drain. On call surgeon was notified of concerns. New orders to change dressing. Will continue to monitor.   Martin Garcia [employer]

## 2017-03-08 ENCOUNTER — Inpatient Hospital Stay: Payer: Medicaid Other

## 2017-03-08 LAB — MRSA PCR SCREENING: MRSA by PCR: NEGATIVE

## 2017-03-08 MED ORDER — IOPAMIDOL (ISOVUE-300) INJECTION 61%
15.0000 mL | INTRAVENOUS | Status: AC
  Administered 2017-03-08 (×2): 15 mL via ORAL

## 2017-03-08 MED ORDER — IOPAMIDOL (ISOVUE-300) INJECTION 61%
100.0000 mL | Freq: Once | INTRAVENOUS | Status: AC | PRN
  Administered 2017-03-08: 100 mL via INTRAVENOUS

## 2017-03-08 MED ORDER — PIPERACILLIN-TAZOBACTAM 3.375 G IVPB
3.3750 g | Freq: Three times a day (TID) | INTRAVENOUS | Status: DC
  Administered 2017-03-08 – 2017-03-16 (×23): 3.375 g via INTRAVENOUS
  Filled 2017-03-08 (×22): qty 50

## 2017-03-08 MED ORDER — LACTATED RINGERS IV SOLN
INTRAVENOUS | Status: AC
  Administered 2017-03-08 – 2017-03-11 (×9): via INTRAVENOUS

## 2017-03-08 MED ORDER — BISACODYL 10 MG RE SUPP
10.0000 mg | Freq: Once | RECTAL | Status: AC
  Administered 2017-03-08: 10 mg via RECTAL
  Filled 2017-03-08: qty 1

## 2017-03-08 NOTE — Progress Notes (Signed)
PHARMACY - ADULT TOTAL PARENTERAL NUTRITION CONSULT NOTE   Pharmacy Consult for new TPN Indication: diverticulitis/bowel obstruction  Patient Measurements: Height: 6' (182.9 cm) Weight: 166 lb 0.1 oz (75.3 kg) IBW/kg (Calculated) : 77.6 TPN AdjBW (KG): 75.3 Body mass index is 22.51 kg/m. Usual Weight:   Assessment:   GI:  Endo:  Insulin requirements in the past 24 hours:  Lytes: Renal: Pulm: Cards:  Hepatobil: Neuro: ID:  TPN Access:  TPN start date: 03/09/17 per Dr. Hampton Abbot Nutritional Goals (per RD recommendation on ): kCal: Protein:  Fluid:  Goal TPN rate is  ml/hr (provides )  Current Nutrition: clear fluids  Plan:  Per Dr. Hampton Abbot PICC to be placed tomorrow AM. He didn't want to order any other IVF at this time. He says it's okay to order labs for AM and start TPN per RD tomorrow. Will replete electrolytes PRN.  Laural Benes, Pharm.D., BCPS Clinical Pharmacist 03/08/2017,6:24 PM

## 2017-03-08 NOTE — Progress Notes (Signed)
03/08/2017  Subjective: No acute events overnight.  Did have a bowel movement with suppository yesterday evening but still feels distended.  Right sided drain no longer draining any fluid and had bleeding around it yesterday.  Denies any nausea but is concerned with eating if not much bowel function.  Vital signs: Temp:  [97 F (36.1 C)-98.2 F (36.8 C)] 97 F (36.1 C) (01/23 0509) Pulse Rate:  [76-98] 76 (01/23 0509) Resp:  [18-20] 18 (01/23 0509) BP: (126-132)/(86-90) 131/90 (01/23 0509) SpO2:  [97 %-98 %] 98 % (01/23 0509)   Intake/Output: 01/22 0701 - 01/23 0700 In: 250 [P.O.:240] Out: 1740 [Urine:1725; Drains:15] Last BM Date: 03/07/17  Physical Exam: Constitutional: No acute distress Abdomen:  Soft, distended, nontender to palpation.  Labs:  No results for input(s): WBC, HGB, HCT, PLT in the last 72 hours. No results for input(s): NA, K, CL, CO2, GLUCOSE, BUN, CREATININE, CALCIUM in the last 72 hours.  Invalid input(s): MAGNESIUM No results for input(s): LABPROT, INR in the last 72 hours.  Imaging: No results found.  Assessment/Plan: 32 yo male with diverticulitis with abscess  --will obtain repeat CT scan abdomen and pelvis today.  Will use this to evaluate for remaining abscess, assess the right drain to see if it's working or would need to be replaced or repositioned, evaluate for possible obstruction. --continue miralax and suppository for now and regular diet.   Melvyn Neth, [place]

## 2017-03-09 LAB — CBC
HCT: 39.8 % — ABNORMAL LOW (ref 40.0–52.0)
Hemoglobin: 13.6 g/dL (ref 13.0–18.0)
MCH: 30.4 pg (ref 26.0–34.0)
MCHC: 34.1 g/dL (ref 32.0–36.0)
MCV: 89.1 fL (ref 80.0–100.0)
Platelets: 529 10*3/uL — ABNORMAL HIGH (ref 150–440)
RBC: 4.46 MIL/uL (ref 4.40–5.90)
RDW: 12.9 % (ref 11.5–14.5)
WBC: 5.9 10*3/uL (ref 3.8–10.6)

## 2017-03-09 LAB — COMPREHENSIVE METABOLIC PANEL
ALT: 18 U/L (ref 17–63)
ANION GAP: 10 (ref 5–15)
AST: 16 U/L (ref 15–41)
Albumin: 3.6 g/dL (ref 3.5–5.0)
Alkaline Phosphatase: 36 U/L — ABNORMAL LOW (ref 38–126)
BUN: 8 mg/dL (ref 6–20)
CO2: 31 mmol/L (ref 22–32)
Calcium: 10 mg/dL (ref 8.9–10.3)
Chloride: 99 mmol/L — ABNORMAL LOW (ref 101–111)
Creatinine, Ser: 0.86 mg/dL (ref 0.61–1.24)
GFR calc non Af Amer: >60 mL/min (ref >60)
GLUCOSE: 94 mg/dL (ref 65–99)
POTASSIUM: 4.5 mmol/L (ref 3.5–5.1)
SODIUM: 140 mmol/L (ref 135–145)
TOTAL PROTEIN: 7.7 g/dL (ref 6.5–8.1)
Total Bilirubin: 0.3 mg/dL (ref 0.3–1.2)

## 2017-03-09 LAB — MAGNESIUM: MAGNESIUM: 2.1 mg/dL (ref 1.7–2.4)

## 2017-03-09 LAB — AEROBIC/ANAEROBIC CULTURE (SURGICAL/DEEP WOUND)

## 2017-03-09 LAB — AEROBIC/ANAEROBIC CULTURE W GRAM STAIN (SURGICAL/DEEP WOUND)

## 2017-03-09 LAB — PHOSPHORUS: Phosphorus: 4.9 mg/dL — ABNORMAL HIGH (ref 2.5–4.6)

## 2017-03-09 MED ORDER — SUCCINYLCHOLINE CHLORIDE 20 MG/ML IJ SOLN
INTRAMUSCULAR | Status: AC
Start: 2017-03-09 — End: 2017-03-09
  Filled 2017-03-09: qty 1

## 2017-03-09 MED ORDER — MIDAZOLAM HCL 2 MG/2ML IJ SOLN
INTRAMUSCULAR | Status: AC
  Filled 2017-03-09: qty 2

## 2017-03-09 MED ORDER — ROCURONIUM BROMIDE 50 MG/5ML IV SOLN
INTRAVENOUS | Status: AC
  Filled 2017-03-09: qty 1

## 2017-03-09 MED ORDER — PROPOFOL 10 MG/ML IV BOLUS
INTRAVENOUS | Status: AC
  Filled 2017-03-09: qty 20

## 2017-03-09 MED ORDER — ONDANSETRON HCL 4 MG/2ML IJ SOLN
INTRAMUSCULAR | Status: AC
  Filled 2017-03-09: qty 2

## 2017-03-09 MED ORDER — TRACE MINERALS CR-CU-MN-SE-ZN 10-1000-500-60 MCG/ML IV SOLN
INTRAVENOUS | Status: AC
  Filled 2017-03-09: qty 984

## 2017-03-09 MED ORDER — FENTANYL CITRATE (PF) 250 MCG/5ML IJ SOLN
INTRAMUSCULAR | Status: AC
  Filled 2017-03-09: qty 5

## 2017-03-09 MED ORDER — INSULIN ASPART 100 UNIT/ML ~~LOC~~ SOLN
0.0000 [IU] | Freq: Four times a day (QID) | SUBCUTANEOUS | Status: DC
  Administered 2017-03-11 (×3): 2 [IU] via SUBCUTANEOUS
  Administered 2017-03-11: 1 [IU] via SUBCUTANEOUS
  Administered 2017-03-12: 2 [IU] via SUBCUTANEOUS
  Administered 2017-03-12: 1 [IU] via SUBCUTANEOUS
  Administered 2017-03-12: 2 [IU] via SUBCUTANEOUS
  Administered 2017-03-13 – 2017-03-17 (×5): 1 [IU] via SUBCUTANEOUS
  Filled 2017-03-09 (×12): qty 1

## 2017-03-09 MED ORDER — FAT EMULSION 20 % IV EMUL
250.0000 mL | INTRAVENOUS | Status: AC
  Filled 2017-03-09: qty 250

## 2017-03-09 MED ORDER — FLEET ENEMA 7-19 GM/118ML RE ENEM
1.0000 | ENEMA | RECTAL | Status: AC
  Administered 2017-03-09 (×2): 1 via RECTAL
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
## 2017-03-09 NOTE — Progress Notes (Signed)
Attempted PICC placement x's in the right brachial vein. Unable to thread wire w/o patient feeling tingling and discomfort during procedure. 2nd PICC nurse to attempt tomorrow.

## 2017-03-09 NOTE — Progress Notes (Signed)
03/09/2017  Subjective: No acute events overnight.  The patient had a CT scan yesterday which showed improved diverticulitis and no further abscess with the drains working appropriately.  However there is still persistent small bowel obstruction with dilated small bowel loops.  Discussed with the patient yesterday that it would be better to go to the operating room today for laparotomy.  Denies any nausea or worsening abdominal pain at this point.  Vital signs: Temp:  [98 F (36.7 C)-98.4 F (36.9 C)] 98 F (36.7 C) (01/24 1437) Pulse Rate:  [77-99] 96 (01/24 1437) Resp:  [16-18] 18 (01/24 1437) BP: (121-143)/(82-89) 124/85 (01/24 1437) SpO2:  [95 %-98 %] 98 % (01/24 1437) Weight:  [72.8 kg (160 lb 9.6 oz)] 72.8 kg (160 lb 9.6 oz) (01/24 1110)   Intake/Output: 01/23 0701 - 01/24 0700 In: 1884 [P.O.:240; I.V.:932; IV Piggyback:50] Out: 1660 [Urine:1425] Last BM Date: 03/08/17  Physical Exam: Constitutional: No acute distress Abdomen: Soft, distended, nontender to palpation.  Bilateral drains in place with serosanguineous fluid.  Labs:  Recent Labs    03/09/17 0356  WBC 5.9  HGB 13.6  HCT 39.8*  PLT 529*   Recent Labs    03/09/17 0356  NA 140  K 4.5  CL 99*  CO2 31  GLUCOSE 94  BUN 8  CREATININE 0.86  CALCIUM 10.0   No results for input(s): LABPROT, INR in the last 72 hours.  Imaging: No results found.  Assessment/Plan: 32 year old male with diverticulitis with abscess and resultant small bowel obstruction.  -Discussed with the patient that to give him the best chances of possible anastomosis instead of an ostomy, will be doing enemas per rectum today to clean out his rectum and distal colon in hopes of being able to reconnect without stool burden.  Discussed with the patient also at that if the small bowel portion as of causing the obstruction is not involved with his diverticulitis, we may potentially only do his small bowel obstruction at this point, given that  indeed his diverticulitis and abscesses have improved.  This way he may have a chance of in the future doing a 1 stage procedure.  However if the small bowel obstruction is intimately involved with the diverticulitis area, then we would have to proceed with sigmoidectomy plus minus end colostomy. -Currently he is booked as an add-on case for the late afternoon.  If however there is an emergency or the OR gets delayed, he understands that he may not go today to the operating room.  If that is the case we will give him clears and then n.p.o. again after midnight. -The patient understands this plan and all of his questions have been answered.   Melvyn Neth, [place]

## 2017-03-09 NOTE — Progress Notes (Signed)
PHARMACY - ADULT TOTAL PARENTERAL NUTRITION CONSULT NOTE   Pharmacy Consult for new TPN Indication: diverticulitis/bowel obstruction  Patient Measurements: Height: 6' (182.9 cm) Weight: 166 lb 0.1 oz (75.3 kg) IBW/kg (Calculated) : 77.6 TPN AdjBW (KG): 75.3 Body mass index is 22.51 kg/m. Usual Weight:   Assessment:   GI: NPO Endo:  Insulin requirements in the past 24 hours: none SSI q6h after start TPN Lytes: Renal: Pulm: Cards:  Hepatobil: Neuro: ID:  TPN Access: ?PICC TPN start date: 03/09/17 per Dr. Hampton Abbot Nutritional Goals (per RD recommendation on ): kCal: Protein:  Fluid:  Goal TPN rate is 83 ml/hr Current IVF: LR at 100 ml/hr  Current Nutrition: NPO  Plan:  Per Dr. Hampton Abbot PICC to be placed tomorrow AM. He didn't want to order any other IVF at this time. He says it's okay to order labs for AM and start TPN per RD tomorrow. Will replete electrolytes PRN.  1/24:  Start Clinimix E 5/20 today at 41 ml/hr with MVI, trace, folic acid and thiamine (thiamine x 3 days d/t shortage). Start Lipids 20% 20 ml/hr x 12 hours.  Patient for exploratory Lap today 1/24.  Electrolytes WNL except Phos 4.9. F/u am labs  Pebble Botkin A, Pharm.D., BCPS Clinical Pharmacist 03/09/2017,11:12 AM

## 2017-03-10 ENCOUNTER — Inpatient Hospital Stay: Payer: Medicaid Other | Admitting: Anesthesiology

## 2017-03-10 ENCOUNTER — Encounter
Admission: AD | Disposition: A | Discharge status: Home / Self Care | Payer: Self-pay | Source: Ambulatory Visit | Attending: Surgery

## 2017-03-10 ENCOUNTER — Encounter: Payer: Self-pay | Admitting: *Deleted

## 2017-03-10 DIAGNOSIS — K56609 Unspecified intestinal obstruction, unspecified as to partial versus complete obstruction: Secondary | ICD-10-CM

## 2017-03-10 HISTORY — PX: LAPAROTOMY: SHX154

## 2017-03-10 LAB — COMPREHENSIVE METABOLIC PANEL
ALBUMIN: 3.4 g/dL — AB (ref 3.5–5.0)
ALK PHOS: 31 U/L — AB (ref 38–126)
ALT: 20 U/L (ref 17–63)
AST: 19 U/L (ref 15–41)
Anion gap: 9 (ref 5–15)
BILIRUBIN TOTAL: 0.6 mg/dL (ref 0.3–1.2)
BUN: 7 mg/dL (ref 6–20)
CALCIUM: 9.6 mg/dL (ref 8.9–10.3)
CO2: 29 mmol/L (ref 22–32)
CREATININE: 0.88 mg/dL (ref 0.61–1.24)
Chloride: 101 mmol/L (ref 101–111)
GFR calc Af Amer: >60 mL/min (ref >60)
GFR calc non Af Amer: >60 mL/min (ref >60)
GLUCOSE: 87 mg/dL (ref 65–99)
Potassium: 4.1 mmol/L (ref 3.5–5.1)
SODIUM: 139 mmol/L (ref 135–145)
Total Protein: 6.9 g/dL (ref 6.5–8.1)

## 2017-03-10 LAB — CBC
HEMATOCRIT: 36.8 % — AB (ref 40.0–52.0)
Hemoglobin: 12.8 g/dL — ABNORMAL LOW (ref 13.0–18.0)
MCH: 30.7 pg (ref 26.0–34.0)
MCHC: 34.7 g/dL (ref 32.0–36.0)
MCV: 88.6 fL (ref 80.0–100.0)
Platelets: 520 10*3/uL — ABNORMAL HIGH (ref 150–440)
RBC: 4.15 MIL/uL — ABNORMAL LOW (ref 4.40–5.90)
RDW: 12.7 % (ref 11.5–14.5)
WBC: 8.3 10*3/uL (ref 3.8–10.6)

## 2017-03-10 LAB — DIFFERENTIAL
BASOS PCT: 1 %
Basophils Absolute: 0.1 10*3/uL (ref 0–0.1)
EOS ABS: 0.3 10*3/uL (ref 0–0.7)
EOS PCT: 3 %
Lymphocytes Relative: 24 %
Lymphs Abs: 2 10*3/uL (ref 1.0–3.6)
MONO ABS: 0.7 10*3/uL (ref 0.2–1.0)
MONOS PCT: 9 %
NEUTROS ABS: 5.3 10*3/uL (ref 1.4–6.5)
Neutrophils Relative %: 63 %

## 2017-03-10 LAB — PREALBUMIN: Prealbumin: 21.1 mg/dL (ref 18–38)

## 2017-03-10 LAB — TRIGLYCERIDES: Triglycerides: 156 mg/dL — ABNORMAL HIGH (ref <150)

## 2017-03-10 LAB — MAGNESIUM: Magnesium: 2 mg/dL (ref 1.7–2.4)

## 2017-03-10 LAB — PHOSPHORUS: Phosphorus: 5.2 mg/dL — ABNORMAL HIGH (ref 2.5–4.6)

## 2017-03-10 SURGERY — LAPAROTOMY, EXPLORATORY
Anesthesia: General

## 2017-03-10 MED ORDER — LACTATED RINGERS IV SOLN
INTRAVENOUS | Status: DC | PRN
  Administered 2017-03-10 (×2): via INTRAVENOUS

## 2017-03-10 MED ORDER — LIDOCAINE HCL (PF) 2 % IJ SOLN
INTRAMUSCULAR | Status: AC
  Filled 2017-03-10: qty 10

## 2017-03-10 MED ORDER — FENTANYL CITRATE (PF) 100 MCG/2ML IJ SOLN
INTRAMUSCULAR | Status: AC
  Filled 2017-03-10: qty 2

## 2017-03-10 MED ORDER — FENTANYL CITRATE (PF) 100 MCG/2ML IJ SOLN
INTRAMUSCULAR | Status: AC
  Administered 2017-03-10: 25 ug via INTRAVENOUS
  Filled 2017-03-10: qty 2

## 2017-03-10 MED ORDER — HYDROMORPHONE HCL 1 MG/ML IJ SOLN
0.5000 mg | INTRAMUSCULAR | Status: AC | PRN
  Administered 2017-03-10 (×4): 0.5 mg via INTRAVENOUS

## 2017-03-10 MED ORDER — BUPIVACAINE-EPINEPHRINE (PF) 0.5% -1:200000 IJ SOLN
INTRAMUSCULAR | Status: DC | PRN
  Administered 2017-03-10: 30 mL via PERINEURAL

## 2017-03-10 MED ORDER — BUPIVACAINE-EPINEPHRINE (PF) 0.5% -1:200000 IJ SOLN
INTRAMUSCULAR | Status: AC
  Filled 2017-03-10: qty 30

## 2017-03-10 MED ORDER — MIDAZOLAM HCL 2 MG/2ML IJ SOLN
INTRAMUSCULAR | Status: DC | PRN
  Administered 2017-03-10 (×2): 2 mg via INTRAVENOUS

## 2017-03-10 MED ORDER — HYDROMORPHONE HCL 1 MG/ML IJ SOLN
1.0000 mg | INTRAMUSCULAR | Status: DC | PRN
  Administered 2017-03-10 – 2017-03-12 (×17): 1 mg via INTRAVENOUS
  Filled 2017-03-10 (×17): qty 1

## 2017-03-10 MED ORDER — BUPIVACAINE LIPOSOME 1.3 % IJ SUSP
INTRAMUSCULAR | Status: AC
  Filled 2017-03-10: qty 20

## 2017-03-10 MED ORDER — SODIUM CHLORIDE 0.9% FLUSH
10.0000 mL | INTRAVENOUS | Status: DC | PRN
  Administered 2017-03-14 – 2017-03-15 (×2): 10 mL
  Filled 2017-03-10 (×2): qty 40

## 2017-03-10 MED ORDER — ONDANSETRON HCL 4 MG/2ML IJ SOLN: 4.0000 mg | Freq: Once | INTRAMUSCULAR | Status: DC | PRN

## 2017-03-10 MED ORDER — HYDROMORPHONE HCL 1 MG/ML IJ SOLN
INTRAMUSCULAR | Status: AC
  Filled 2017-03-10: qty 1

## 2017-03-10 MED ORDER — SODIUM CHLORIDE 0.9 % IJ SOLN
INTRAMUSCULAR | Status: AC
  Filled 2017-03-10: qty 10

## 2017-03-10 MED ORDER — SODIUM CHLORIDE 0.9 % IV SOLN
INTRAVENOUS | Status: DC | PRN
  Administered 2017-03-10: 17:00:00

## 2017-03-10 MED ORDER — MIDAZOLAM HCL 2 MG/2ML IJ SOLN
INTRAMUSCULAR | Status: AC
  Filled 2017-03-10: qty 2

## 2017-03-10 MED ORDER — BUPIVACAINE-EPINEPHRINE (PF) 0.25% -1:200000 IJ SOLN
INTRAMUSCULAR | Status: AC
  Filled 2017-03-10: qty 10

## 2017-03-10 MED ORDER — LIDOCAINE HCL (CARDIAC) 20 MG/ML IV SOLN
INTRAVENOUS | Status: DC | PRN
  Administered 2017-03-10: 100 mg via INTRAVENOUS

## 2017-03-10 MED ORDER — SUGAMMADEX SODIUM 200 MG/2ML IV SOLN
INTRAVENOUS | Status: DC | PRN
  Administered 2017-03-10: 150 mg via INTRAVENOUS

## 2017-03-10 MED ORDER — SODIUM CHLORIDE 0.9% FLUSH
10.0000 mL | Freq: Two times a day (BID) | INTRAVENOUS | Status: DC
  Administered 2017-03-10 – 2017-03-15 (×13): 10 mL
  Administered 2017-03-16 (×2): 20 mL
  Administered 2017-03-18 – 2017-03-19 (×2): 10 mL

## 2017-03-10 MED ORDER — ACETAMINOPHEN 10 MG/ML IV SOLN
INTRAVENOUS | Status: DC | PRN
  Administered 2017-03-10: 1000 mg via INTRAVENOUS

## 2017-03-10 MED ORDER — HYDROMORPHONE HCL 1 MG/ML IJ SOLN
INTRAMUSCULAR | Status: AC
  Administered 2017-03-10: 0.5 mg via INTRAVENOUS
  Filled 2017-03-10: qty 1

## 2017-03-10 MED ORDER — PROPOFOL 10 MG/ML IV BOLUS
INTRAVENOUS | Status: AC
  Filled 2017-03-10: qty 20

## 2017-03-10 MED ORDER — ROCURONIUM BROMIDE 100 MG/10ML IV SOLN
INTRAVENOUS | Status: DC | PRN
  Administered 2017-03-10: 25 mg via INTRAVENOUS
  Administered 2017-03-10 (×2): 10 mg via INTRAVENOUS
  Administered 2017-03-10: 20 mg via INTRAVENOUS
  Administered 2017-03-10 (×2): 10 mg via INTRAVENOUS
  Administered 2017-03-10: 5 mg via INTRAVENOUS

## 2017-03-10 MED ORDER — FENTANYL CITRATE (PF) 100 MCG/2ML IJ SOLN
25.0000 ug | INTRAMUSCULAR | Status: AC | PRN
  Administered 2017-03-10 (×2): 25 ug via INTRAVENOUS

## 2017-03-10 MED ORDER — PIPERACILLIN-TAZOBACTAM 3.375 G IVPB
INTRAVENOUS | Status: AC
  Filled 2017-03-10: qty 50

## 2017-03-10 MED ORDER — FLEET ENEMA 7-19 GM/118ML RE ENEM
1.0000 | ENEMA | Freq: Once | RECTAL | Status: AC
  Administered 2017-03-10: 1 via RECTAL

## 2017-03-10 MED ORDER — DEXMEDETOMIDINE HCL 200 MCG/2ML IV SOLN
INTRAVENOUS | Status: DC | PRN
  Administered 2017-03-10: 8 ug via INTRAVENOUS
  Administered 2017-03-10: 4 ug via INTRAVENOUS

## 2017-03-10 MED ORDER — DEXAMETHASONE SODIUM PHOSPHATE 10 MG/ML IJ SOLN
INTRAMUSCULAR | Status: DC | PRN
  Administered 2017-03-10: 10 mg via INTRAVENOUS

## 2017-03-10 MED ORDER — FENTANYL CITRATE (PF) 100 MCG/2ML IJ SOLN
INTRAMUSCULAR | Status: DC | PRN
  Administered 2017-03-10 (×2): 50 ug via INTRAVENOUS
  Administered 2017-03-10 (×2): 100 ug via INTRAVENOUS

## 2017-03-10 MED ORDER — PHENYLEPHRINE HCL 10 MG/ML IJ SOLN
INTRAMUSCULAR | Status: DC | PRN
  Administered 2017-03-10 (×4): 80 ug via INTRAVENOUS

## 2017-03-10 MED ORDER — FENTANYL CITRATE (PF) 100 MCG/2ML IJ SOLN
25.0000 ug | INTRAMUSCULAR | Status: AC | PRN
  Administered 2017-03-10 (×6): 25 ug via INTRAVENOUS

## 2017-03-10 MED ORDER — ACETAMINOPHEN 10 MG/ML IV SOLN
INTRAVENOUS | Status: AC
  Filled 2017-03-10: qty 100

## 2017-03-10 MED ORDER — SUCCINYLCHOLINE CHLORIDE 20 MG/ML IJ SOLN
INTRAMUSCULAR | Status: DC | PRN
  Administered 2017-03-10: 100 mg via INTRAVENOUS

## 2017-03-10 MED ORDER — PROPOFOL 10 MG/ML IV BOLUS
INTRAVENOUS | Status: DC | PRN
  Administered 2017-03-10: 200 mg via INTRAVENOUS

## 2017-03-10 MED ORDER — ONDANSETRON HCL 4 MG/2ML IJ SOLN
INTRAMUSCULAR | Status: AC
  Filled 2017-03-10: qty 2

## 2017-03-10 MED ORDER — LORAZEPAM 2 MG/ML IJ SOLN: 1.0000 mg | Freq: Once | INTRAMUSCULAR | Status: DC

## 2017-03-10 MED ORDER — FAT EMULSION 20 % IV EMUL
250.0000 mL | INTRAVENOUS | Status: AC
  Administered 2017-03-10: 250 mL via INTRAVENOUS
  Filled 2017-03-10: qty 250

## 2017-03-10 MED ORDER — ROCURONIUM BROMIDE 50 MG/5ML IV SOLN
INTRAVENOUS | Status: AC
  Filled 2017-03-10: qty 1

## 2017-03-10 MED ORDER — TRACE MINERALS CR-CU-MN-SE-ZN 10-1000-500-60 MCG/ML IV SOLN
INTRAVENOUS | Status: AC
  Administered 2017-03-10: 22:00:00 via INTRAVENOUS
  Filled 2017-03-10: qty 984

## 2017-03-10 SURGICAL SUPPLY — 42 items
BLADE BOVIE TIP EXT 4 (BLADE) ×2 IMPLANT
BULB RESERV EVAC DRAIN JP 100C (MISCELLANEOUS) ×2 IMPLANT
CANISTER SUCT 1200ML W/VALVE (MISCELLANEOUS) ×6 IMPLANT
CHLORAPREP W/TINT 10.5 ML (MISCELLANEOUS) ×2 IMPLANT
COUNTER NEEDLE 20/40 LG (NEEDLE) ×2 IMPLANT
DRAPE INCISE IOBAN 66X45 STRL (DRAPES) ×2 IMPLANT
DRAPE LAPAROTOMY T 102X78X121 (DRAPES) ×2 IMPLANT
DRSG OPSITE POSTOP 4X12 (GAUZE/BANDAGES/DRESSINGS) ×2 IMPLANT
DRSG TEGADERM 4X10 (GAUZE/BANDAGES/DRESSINGS) IMPLANT
DRSG TEGADERM 4X4.75 (GAUZE/BANDAGES/DRESSINGS) ×2 IMPLANT
ELECT CAUTERY BLADE 6.4 (BLADE) ×2 IMPLANT
ELECT REM PT RETURN 9FT ADLT (ELECTROSURGICAL) ×2
ELECTRODE REM PT RTRN 9FT ADLT (ELECTROSURGICAL) ×1 IMPLANT
GAUZE SPONGE 4X4 12PLY STRL (GAUZE/BANDAGES/DRESSINGS) ×2 IMPLANT
GLOVE SURG SYN 7.0 (GLOVE) ×4 IMPLANT
GLOVE SURG SYN 7.5  E (GLOVE) ×2
GLOVE SURG SYN 7.5 E (GLOVE) ×2 IMPLANT
GOWN STRL REUS W/ TWL LRG LVL3 (GOWN DISPOSABLE) ×4 IMPLANT
GOWN STRL REUS W/TWL LRG LVL3 (GOWN DISPOSABLE) ×4
LABEL OR SOLS (LABEL) IMPLANT
LIGASURE IMPACT 36 18CM CVD LR (INSTRUMENTS) IMPLANT
NEEDLE HYPO 22GX1.5 SAFETY (NEEDLE) ×2 IMPLANT
NS IRRIG 1000ML POUR BTL (IV SOLUTION) ×8 IMPLANT
PACK BASIN MAJOR ARMC (MISCELLANEOUS) ×2 IMPLANT
PACK COLON CLEAN CLOSURE (MISCELLANEOUS) ×2 IMPLANT
RELOAD STAPLE SKIN SM 35W (MISCELLANEOUS) IMPLANT
SEPRAFILM MEMBRANE 5X6 (MISCELLANEOUS) IMPLANT
SPONGE VERSALON 4X4 4PLY (MISCELLANEOUS) ×4 IMPLANT
STAPLER PROXIMATE 75MM BLUE (STAPLE) ×2 IMPLANT
SUT ETHILON 3-0 FS-10 30 BLK (SUTURE) ×2
SUT PDS AB 1 TP1 96 (SUTURE) IMPLANT
SUT PROLENE 0 CT 1 30 (SUTURE) IMPLANT
SUT SILK 2 0 (SUTURE)
SUT SILK 2-0 18XBRD TIE 12 (SUTURE) IMPLANT
SUT SILK 3-0 (SUTURE) ×10 IMPLANT
SUT VIC AB 3-0 SH 27 (SUTURE) ×1
SUT VIC AB 3-0 SH 27X BRD (SUTURE) ×1 IMPLANT
SUTURE EHLN 3-0 FS-10 30 BLK (SUTURE) ×1 IMPLANT
SYR 10ML LL (SYRINGE) ×2 IMPLANT
SYR 30ML LL (SYRINGE) ×2 IMPLANT
TAPE TRANSPORE STRL 2 31045 (GAUZE/BANDAGES/DRESSINGS) ×2 IMPLANT
TRAY FOLEY W/METER SILVER 16FR (SET/KITS/TRAYS/PACK) ×2 IMPLANT

## 2017-03-10 NOTE — Anesthesia Procedure Notes (Signed)
Procedure Name: Intubation Date/Time: 03/10/2017 2:18 PM Performed by: Marsh Dolly, CRNA Pre-anesthesia Checklist: Patient identified, Patient being monitored, Timeout performed, Emergency Drugs available and Suction available Patient Re-evaluated:Patient Re-evaluated prior to induction Oxygen Delivery Method: Circle system utilized Preoxygenation: Pre-oxygenation with 100% oxygen Induction Type: IV induction Ventilation: Mask ventilation without difficulty Laryngoscope Size: 3 and Miller Grade View: Grade I Tube type: Oral Tube size: 7.5 mm Number of attempts: 1 Placement Confirmation: ETT inserted through vocal cords under direct vision,  positive ETCO2 and breath sounds checked- equal and bilateral Secured at: 21 cm Tube secured with: Tape Dental Injury: Teeth and Oropharynx as per pre-operative assessment

## 2017-03-10 NOTE — Anesthesia Postprocedure Evaluation (Signed)
Anesthesia Post Note  Patient: Martin Garcia  Procedure(s) Performed: EXPLORATORY LAPAROTOMY (N/A )  Patient location during evaluation: PACU Anesthesia Type: General Level of consciousness: awake and alert and oriented Pain management: pain level controlled Vital Signs Assessment: post-procedure vital signs reviewed and stable Respiratory status: spontaneous breathing Cardiovascular status: blood pressure returned to baseline Anesthetic complications: no     Last Vitals:  Vitals:   03/10/17 1935 03/10/17 1949  BP: (!) 137/93 136/89  Pulse: 98 (!) 108  Resp: (!) 7 16  Temp:  36.7 C  SpO2: 93% 96%    Last Pain:  Vitals:   03/10/17 1951  TempSrc:   PainSc: 4                  Jhoana Upham

## 2017-03-10 NOTE — Progress Notes (Signed)
Peripherally Inserted Central Catheter/Midline Placement  The IV Nurse has discussed with the patient and/or persons authorized to consent for the patient, the purpose of this procedure and the potential benefits and risks involved with this procedure.  The benefits include less needle sticks, lab draws from the catheter, and the patient may be discharged home with the catheter. Risks include, but not limited to, infection, bleeding, blood clot (thrombus formation), and puncture of an artery; nerve damage and irregular heartbeat and possibility to perform a PICC exchange if needed/ordered by physician.  Alternatives to this procedure were also discussed.  Bard Power PICC patient education guide, fact sheet on infection prevention and patient information card has been provided to patient /or left at bedside.    PICC/Midline Placement Documentation  PICC Double Lumen 14/38/88 PICC Left Basilic 45 cm 0 cm (Active)  Indication for Insertion or Continuance of Line Administration of hyperosmolar/irritating solutions (i.e. TPN, Vancomycin, etc.) 03/10/2017 11:00 AM  Exposed Catheter (cm) 0 cm 03/10/2017 11:00 AM  Site Assessment Clean;Dry;Intact 03/10/2017 11:00 AM  Lumen #1 Status Flushed;Saline locked;Blood return noted 03/10/2017 11:00 AM  Lumen #2 Status Flushed;Saline locked;Blood return noted 03/10/2017 11:00 AM  Dressing Type Transparent;Securing device 03/10/2017 11:00 AM  Dressing Status Clean;Dry;Intact;Antimicrobial disc in place 03/10/2017 11:00 AM  Dressing Change Due 03/17/17 03/10/2017 11:00 AM   PICC placed by Jake Samples.    Frances Maywood 03/10/2017, 11:05 AM
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
## 2017-03-10 NOTE — Transfer of Care (Signed)
Immediate Anesthesia Transfer of Care Note  Patient: Martin Garcia  Procedure(s) Performed: EXPLORATORY LAPAROTOMY (N/A )  Patient Location: PACU  Anesthesia Type:General  Level of Consciousness: awake and patient cooperative  Airway & Oxygen Therapy: Patient Spontanous Breathing and Patient connected to face mask oxygen  Post-op Assessment: Report given to RN and Post -op Vital signs reviewed and stable  Post vital signs: Reviewed and stable  Last Vitals:  Vitals:   03/10/17 1209 03/10/17 1243  BP: (!) 136/93 (!) 138/97  Pulse: 91 87  Resp: 17 20  Temp: 36.8 C 36.8 C  SpO2: 97% 97%    Last Pain:  Vitals:   03/10/17 1243  TempSrc: Tympanic  PainSc: 6       Patients Stated Pain Goal: 1 (32/44/01 0272)  Complications: No apparent anesthesia complications

## 2017-03-10 NOTE — Anesthesia Post-op Follow-up Note (Signed)
Anesthesia QCDR form completed.        

## 2017-03-10 NOTE — Plan of Care (Signed)
Pt went for a exploratory lap today. PICC line was placed prior  to surgery along with pt receiving an enema.

## 2017-03-10 NOTE — Progress Notes (Signed)
03/10/2017  Subjective: No acute events overnight.  Yesterday were not able to go to the operating room due to time restraints as well as being bumped for an emergency.  Currently he is scheduled for today in the early afternoon.  He was not able to get a PICC line yesterday in the right upper extremity but the PICC team is in there at this morning to try on the left arm.  He did have bowel movements after the enemas to try to clean his rectum out but otherwise continues feeling distended.  Vital signs: Temp:  [97.8 F (36.6 C)-98 F (36.7 C)] 97.9 F (36.6 C) (01/25 0507) Pulse Rate:  [75-96] 75 (01/25 0507) Resp:  [18] 18 (01/25 0507) BP: (124-130)/(85-88) 130/88 (01/25 0507) SpO2:  [98 %] 98 % (01/25 0507)   Intake/Output: 01/24 0701 - 01/25 0700 In: 2653 [P.O.:240; I.V.:2253; IV Piggyback:150] Out: 2820 [Urine:2800; Drains:20] Last BM Date: 03/09/17  Physical Exam: Constitutional: No acute distress Abdomen: Soft, distended, with mild tenderness in the low pelvis which has remained stable throughout.  Bilateral drains in place.  Right drain with minimal serosanguineous fluid, and left drain with mild amount of serosanguineous fluid as well.  Labs:  Recent Labs    03/09/17 0356 03/10/17 0344  WBC 5.9 8.3  HGB 13.6 12.8*  HCT 39.8* 36.8*  PLT 529* 520*   Recent Labs    03/09/17 0356 03/10/17 0344  NA 140 139  K 4.5 4.1  CL 99* 101  CO2 31 29  GLUCOSE 94 87  BUN 8 7  CREATININE 0.86 0.88  CALCIUM 10.0 9.6   No results for input(s): LABPROT, INR in the last 72 hours.  Imaging: No results found.  Assessment/Plan: 32 year old male with diverticulitis with abscess and resultant small bowel obstruction.  -We will take the patient today to the operating room for exploratory laparotomy.  Discussed again with the patient the possibilities for surgery today.  If the small bowel obstruction is not involved with the diverticulitis, then we would proceed only with a small  bowel obstruction treatment given that his diverticulitis indeed is better.  We would only do a washout and drain placement.  If the small bowel obstruction is more intimately involved with a diverticulitis, then we may have to do an en bloc resection.  We would attempt to do a colorectal anastomosis to but if there is too much infection or stool spillage, then we would do a Hartman's procedure.  The patient is aware of all these 3 possibilities and is willing to proceed.   Melvyn Neth, [place]

## 2017-03-10 NOTE — Op Note (Signed)
Procedure Date:  03/10/2017  Pre-operative Diagnosis:  Small bowel obstruction secondary to acute diverticulitis with abscess  Post-operative Diagnosis:  Small bowel obstruction secondary to acute diverticulitis with abscess  Procedure:  Exploratory Laparotomy and extensive lysis of adhesions  Surgeon:  Melvyn Neth, MD  Assistant:  Nestor Lewandowsky, MD  Anesthesia:  General endotracheal  Estimated Blood Loss:  200 ml  Specimens:  None  Complications:  None  Indications for Procedure:  This is a 32 y.o. male who presents with acute diverticulitis with two abscesses requiring drainage.  His diverticulitis was improving but the inflammation resulted in scarring leading to small bowel obstruction.  The risks of bleeding, abscess or infection, injury to surrounding structures, and need for further procedures were all discussed with the patient and was willing to proceed.  Description of Procedure: The patient was correctly identified in the preoperative area and brought into the operating room.  The patient was placed supine with VTE prophylaxis in place.  Appropriate time-outs were performed.  Anesthesia was induced and the patient was intubated.  Foley catheter was placed.  Appropriate antibiotics were infused.  The patient was placed in lithotomy position.  The abdomen was prepped and draped in a sterile fashion.  A midline incision was made and electrocautery was used to dissect down the subcutaneous tissue to the fascia.  The fascia was incised and extended superiorly and inferiorly.  The small bowel was very distended, with different areas of the omentum wrapped around loops.  The omentum was freed and pushed cephalad.  The small bowel was then run distally, revealing multiple interloop inflammatory adhesions.  These were freed using both blunt dissection and cautery.  Two of the adhesions were very close between bowel loops.  Moving more distally, a loop of small bowel was diving into  the pelvis and was scarred against the bladder, this was also dissected free using cautery and blunt dissection.  Then we were able to reach the terminal ileum to visualize the right colon and appendix.  The sigmoid colon was also evaluated and proximally it was healthy and there was some scarring down in the pelvis but no obvious perforation or abscess.  The patient's prior IR drains were not visible.    The small bowel was then re-evaluated and all bowel contents were pushed retrograde to the stomach and NG tube.  1850 ml were suctioned out through the NG tube.  Then the bowel was run again and 4 areas of adhesions that were taken down were reinforced and repaired using 3-0 Silk sutures.  The sigmoid was then re-evaluated and again there was no gross perforation or abscess.  The pelvis was thoroughly irrigated using 2 liters of warm normal saline.  A 19 Fr drain was introduced through the right lower abdominal wall and tip placed in the deep pelvis.  The omentum was then brought back in place and pushed towards the pelvis as well.    60 ml of Exparel solution were infiltrated in the peritoneum and fascia and subcutaneous tissue.  The incision was then closed with two #1 loop PDS sutures.  The wound was irrigated and then closed with staples.  The drain was secured to the skin using 3-0 Nylon.  The wound was cleaned and dressed with Honeycomb dressing.  The drain was dressed with 4x4 gauze and Tegaderm.  The patient was emerged from anesthesia and extubated and brought to the recovery room for further management.  The patient tolerated the procedure well and all  counts were correct at the end of the case.   Melvyn Neth, MD

## 2017-03-10 NOTE — Anesthesia Preprocedure Evaluation (Signed)
Anesthesia Evaluation  Patient identified by MRN, date of birth, ID band Patient awake    Reviewed: Allergy & Precautions, NPO status , Patient's Chart, lab work & pertinent test results  History of Anesthesia Complications Negative for: history of anesthetic complications  Airway Mallampati: II       Dental   Pulmonary neg sleep apnea, neg COPD, Current Smoker (none since 02/15/17),           Cardiovascular (-) hypertension(-) Past MI and (-) CHF (-) dysrhythmias (-) Valvular Problems/Murmurs     Neuro/Psych neg Seizures    GI/Hepatic Neg liver ROS, neg GERD  ,  Endo/Other  neg diabetes  Renal/GU negative Renal ROS     Musculoskeletal   Abdominal   Peds  Hematology   Anesthesia Other Findings   Reproductive/Obstetrics                             Anesthesia Physical Anesthesia Plan  ASA: II and emergent  Anesthesia Plan: General   Post-op Pain Management:    Induction: Intravenous  PONV Risk Score and Plan: 1 and Midazolam and Ondansetron  Airway Management Planned: Oral ETT  Additional Equipment:   Intra-op Plan:   Post-operative Plan:   Informed Consent: I have reviewed the patients History and Physical, chart, labs and discussed the procedure including the risks, benefits and alternatives for the proposed anesthesia with the patient or authorized representative who has indicated his/her understanding and acceptance.     Plan Discussed with:   Anesthesia Plan Comments:         Anesthesia Quick Evaluation

## 2017-03-10 NOTE — Progress Notes (Signed)
PHARMACY - ADULT TOTAL PARENTERAL NUTRITION CONSULT NOTE   Pharmacy Consult for new TPN Indication: diverticulitis/bowel obstruction  Patient Measurements: Height: 6' (182.9 cm) Weight: 160 lb 9.6 oz (72.8 kg) IBW/kg (Calculated) : 77.6 TPN AdjBW (KG): 75.3 Body mass index is 21.78 kg/m. Usual Weight:   Assessment:   GI: NPO Endo:  Insulin requirements in the past 24 hours: none SSI q6h after start TPN Lytes: Renal: Pulm: Cards:  Hepatobil: Neuro: ID:  TPN Access: PICC TPN start date: 03/10/17 per Dr. Hampton Abbot Nutritional Goals (per RD recommendation on ): kCal: Protein:  Fluid:  Goal TPN rate is 83 ml/hr Current IVF: LR at 100 ml/hr  Current Nutrition: NPO  Plan:  Per Dr. Hampton Abbot PICC to be placed tomorrow AM. He didn't want to order any other IVF at this time. He says it's okay to order labs for AM and start TPN per RD tomorrow. Will replete electrolytes PRN.  1/24:  Start Clinimix E 5/20 today at 41 ml/hr with MVI, trace, folic acid and thiamine (thiamine x 3 days d/t shortage). Start Lipids 20% 20 ml/hr x 12 hours.  Patient for exploratory Lap today 1/24.  Electrolytes WNL except Phos 4.9. F/u am labs  1/25: Unable to insert PICC line yesterday but has been placed today- so TPN to start today. Clinimix E 5/20 today at 41 ml/hr with MVI, trace, folic acid and thiamine (thiamine x 3 days d/t shortage). Start Lipids 20% 20 ml/hr x 12 hours.  Patient for exploratory Lap today 1/25 (unable to do yesterday).  Electrolytes WNL except Phos 5.2. Patient had #2 sodium phosphate enemas (Fleets). F/u am labs. Dietician anticipates patient will refeed some.   Wiliam Cauthorn A, Pharm.D., BCPS Clinical Pharmacist 03/10/2017,12:02 PM

## 2017-03-11 ENCOUNTER — Encounter: Payer: Self-pay | Admitting: Surgery

## 2017-03-11 ENCOUNTER — Inpatient Hospital Stay: Payer: Medicaid Other

## 2017-03-11 LAB — COMPREHENSIVE METABOLIC PANEL
ALBUMIN: 3.1 g/dL — AB (ref 3.5–5.0)
ALT: 20 U/L (ref 17–63)
ANION GAP: 8 (ref 5–15)
AST: 20 U/L (ref 15–41)
Alkaline Phosphatase: 30 U/L — ABNORMAL LOW (ref 38–126)
BUN: 9 mg/dL (ref 6–20)
CHLORIDE: 98 mmol/L — AB (ref 101–111)
CO2: 27 mmol/L (ref 22–32)
Calcium: 8.7 mg/dL — ABNORMAL LOW (ref 8.9–10.3)
Creatinine, Ser: 0.73 mg/dL (ref 0.61–1.24)
GFR calc Af Amer: >60 mL/min (ref >60)
Glucose, Bld: 182 mg/dL — ABNORMAL HIGH (ref 65–99)
POTASSIUM: 4 mmol/L (ref 3.5–5.1)
Sodium: 133 mmol/L — ABNORMAL LOW (ref 135–145)
TOTAL PROTEIN: 6.5 g/dL (ref 6.5–8.1)
Total Bilirubin: 0.4 mg/dL (ref 0.3–1.2)

## 2017-03-11 LAB — GLUCOSE, CAPILLARY
GLUCOSE-CAPILLARY: 146 mg/dL — AB (ref 65–99)
GLUCOSE-CAPILLARY: 156 mg/dL — AB (ref 65–99)
GLUCOSE-CAPILLARY: 183 mg/dL — AB (ref 65–99)
Glucose-Capillary: 157 mg/dL — ABNORMAL HIGH (ref 65–99)
Glucose-Capillary: 173 mg/dL — ABNORMAL HIGH (ref 65–99)

## 2017-03-11 LAB — CBC WITH DIFFERENTIAL/PLATELET
BASOS ABS: 0 10*3/uL (ref 0–0.1)
BASOS PCT: 0 %
EOS ABS: 0 10*3/uL (ref 0–0.7)
Eosinophils Relative: 0 %
HEMATOCRIT: 41.1 % (ref 40.0–52.0)
HEMOGLOBIN: 13.9 g/dL (ref 13.0–18.0)
Lymphocytes Relative: 11 %
Lymphs Abs: 1.8 10*3/uL (ref 1.0–3.6)
MCH: 29.7 pg (ref 26.0–34.0)
MCHC: 33.7 g/dL (ref 32.0–36.0)
MCV: 88 fL (ref 80.0–100.0)
MONOS PCT: 9 %
Monocytes Absolute: 1.5 10*3/uL — ABNORMAL HIGH (ref 0.2–1.0)
NEUTROS ABS: 13.7 10*3/uL — AB (ref 1.4–6.5)
NEUTROS PCT: 80 %
Platelets: 545 10*3/uL — ABNORMAL HIGH (ref 150–440)
RBC: 4.68 MIL/uL (ref 4.40–5.90)
RDW: 12.9 % (ref 11.5–14.5)
WBC: 17.1 10*3/uL — AB (ref 3.8–10.6)

## 2017-03-11 LAB — MAGNESIUM: MAGNESIUM: 1.8 mg/dL (ref 1.7–2.4)

## 2017-03-11 LAB — PHOSPHORUS: Phosphorus: 3.1 mg/dL (ref 2.5–4.6)

## 2017-03-11 MED ORDER — TRACE MINERALS CR-CU-MN-SE-ZN 10-1000-500-60 MCG/ML IV SOLN
INTRAVENOUS | Status: AC
  Administered 2017-03-11: 18:00:00 via INTRAVENOUS
  Filled 2017-03-11: qty 1992

## 2017-03-11 MED ORDER — KETOROLAC TROMETHAMINE 30 MG/ML IJ SOLN
30.0000 mg | Freq: Four times a day (QID) | INTRAMUSCULAR | Status: AC
  Administered 2017-03-11 – 2017-03-16 (×20): 30 mg via INTRAVENOUS
  Filled 2017-03-11 (×20): qty 1

## 2017-03-11 MED ORDER — SODIUM CHLORIDE 0.9 % IV SOLN
INTRAVENOUS | Status: DC
  Administered 2017-03-11 – 2017-03-18 (×7): via INTRAVENOUS

## 2017-03-11 MED ORDER — FAT EMULSION 20 % IV EMUL
250.0000 mL | INTRAVENOUS | Status: AC
  Administered 2017-03-11: 250 mL via INTRAVENOUS
  Filled 2017-03-11: qty 250

## 2017-03-11 NOTE — Plan of Care (Signed)
Pt refused chest xray again this am due to pain along with flushing and changing the bandage for the drain on his bottom right. Dr Hampton Abbot added torodol and since then pts pain has began to improve.

## 2017-03-11 NOTE — Progress Notes (Signed)
Patient requesting pain medication Surgeon was called (Dr. Rosana Hoes) chose to stay with current medication schedule at this time. Patient informed.

## 2017-03-11 NOTE — Progress Notes (Signed)
03/11/2017  Subjective: Patient is 1 Day Post-Op s/p exploratory laparotomy with lysis of adhesions.  Patient had a lot of abdominal pain last night and his dilaudid dose needed to be increased.  I started Toradol this morning and this seems to help better.  No bowel function yet.  Vital signs: Temp:  [97.6 F (36.4 C)-100 F (37.8 C)] 98.4 F (36.9 C) (01/26 1243) Pulse Rate:  [83-118] 83 (01/26 1243) Resp:  [5-21] 20 (01/26 1243) BP: (110-148)/(69-102) 124/77 (01/26 1243) SpO2:  [92 %-100 %] 96 % (01/26 1243)   Intake/Output: 01/25 0701 - 01/26 0700 In: 3072 [P.O.:2; I.V.:3000; IV Piggyback:50] Out: 4268 [Urine:2350; Drains:40; Blood:200] Last BM Date: 03/10/17  Physical Exam: Constitutional:  No acute distress Abdomen:  Soft, mildly distended, tender to palpation over midline incision as well as right JP drain insertion site.  Incision clean, dry, intact with staples in place and Honeycomb dressing.  JP drain with serosanguinous fluid.  Retroperitoneal drains with serosanguinous fluid.  Labs:  Recent Labs    03/10/17 0344 03/11/17 0714  WBC 8.3 17.1*  HGB 12.8* 13.9  HCT 36.8* 41.1  PLT 520* 545*   Recent Labs    03/10/17 0344 03/11/17 0701  NA 139 133*  K 4.1 4.0  CL 101 98*  CO2 29 27  GLUCOSE 87 182*  BUN 7 9  CREATININE 0.88 0.73  CALCIUM 9.6 8.7*   No results for input(s): LABPROT, INR in the last 72 hours.  Imaging: No results found.  Assessment/Plan: 32 yo male s/p exploratory laparotomy  --Continue NPO with IV fluid hydration.  NG has had low output but there is no bowel function and given the amount of inflammatory adhesions that needed to be taken down, I am expecting a post-op ileus. --Continue TPN today and adjust total IV fluid rate to 125 ml as TPN volume advances. --Continue toradol and IV dilaudid.  May need PCA if his pain is not better. --Continue IV antibiotics --Continue foley catheter today given that he's in quite a bit of pain that  may impede voiding.  Will likely remove foley tomorrow.   Melvyn Neth, [place]

## 2017-03-11 NOTE — Progress Notes (Signed)
PHARMACY - ADULT TOTAL PARENTERAL NUTRITION CONSULT NOTE   Pharmacy Consult for new TPN Indication: diverticulitis/bowel obstruction  Patient Measurements: Height: 6' (182.9 cm) Weight: 160 lb 9.6 oz (72.8 kg) IBW/kg (Calculated) : 77.6 TPN AdjBW (KG): 75.3 Body mass index is 21.78 kg/m. Usual Weight:   Assessment:   GI: NPO Endo: BG 183, 173; Insulin requirements in the past 24 hours: 4 units SSI q6h Lytes:  K 4.0, Mag 1.8, Phos 3.1 Renal: SCr 0.73  TPN Access: PICC TPN start date: 03/10/17 per Dr. Hampton Abbot Nutritional Goals (per RD recommendation on ): kCal: Protein:  Fluid:  Goal TPN rate is 83 ml/hr Current IVF: LR at 100 ml/hr  Current Nutrition: Clinimix E 5/20 at 41 ml/hr  1/24:  Start Clinimix E 5/20 today at 41 ml/hr with MVI, trace, folic acid and thiamine (thiamine x 3 days d/t shortage). Start Lipids 20% 20 ml/hr x 12 hours.  Patient for exploratory Lap today 1/24.  Electrolytes WNL except Phos 4.9. F/u am labs  1/25: Unable to insert PICC line yesterday but has been placed today- so TPN to start today. Clinimix E 5/20 today at 41 ml/hr with MVI, trace, folic acid and thiamine (thiamine x 3 days d/t shortage). Start Lipids 20% 20 ml/hr x 12 hours.  Patient for exploratory Lap today 1/25 (unable to do yesterday).  Electrolytes WNL except Phos 5.2. Patient had #2 sodium phosphate enemas (Fleets). F/u am labs. Dietician anticipates patient will refeed some.  Plan:  Will increase TPN to goal rate starting tonight - Clinimix E 5/20 at 83 ml/hr with MVI, trace elements, folic acid and thiamine (thiamine x 3 days d/t shortage). (Discussed rate increase to goal with on-call dietician and Surgeon). Lipids 20% 20 ml/hr x 12 hours.    Discussed fluid rate with Dr. Hampton Abbot. MD would like total of fluids to be 125 ml/hr. Once TPN is increased to goal rate of 83 ml/hr at 1800, LR @100  ml/hr to be changed to NS @40  ml/hr.   Continue SSI Recheck TPN labs in AM   Chaunte Hornbeck,  Lavonda Jumbo, [place].D., BCPS Clinical Pharmacist 03/11/2017,11:49 AM

## 2017-03-12 LAB — CBC WITH DIFFERENTIAL/PLATELET
Basophils Absolute: 0 10*3/uL (ref 0–0.1)
Basophils Relative: 0 %
EOS ABS: 0.1 10*3/uL (ref 0–0.7)
EOS PCT: 1 %
HCT: 36.5 % — ABNORMAL LOW (ref 40.0–52.0)
Hemoglobin: 12.6 g/dL — ABNORMAL LOW (ref 13.0–18.0)
LYMPHS ABS: 1.5 10*3/uL (ref 1.0–3.6)
LYMPHS PCT: 11 %
MCH: 30.3 pg (ref 26.0–34.0)
MCHC: 34.6 g/dL (ref 32.0–36.0)
MCV: 87.7 fL (ref 80.0–100.0)
MONO ABS: 1.5 10*3/uL — AB (ref 0.2–1.0)
MONOS PCT: 11 %
Neutro Abs: 11.1 10*3/uL — ABNORMAL HIGH (ref 1.4–6.5)
Neutrophils Relative %: 77 %
PLATELETS: 450 10*3/uL — AB (ref 150–440)
RBC: 4.15 MIL/uL — ABNORMAL LOW (ref 4.40–5.90)
RDW: 12.8 % (ref 11.5–14.5)
WBC: 14.3 10*3/uL — AB (ref 3.8–10.6)

## 2017-03-12 LAB — GLUCOSE, CAPILLARY
GLUCOSE-CAPILLARY: 159 mg/dL — AB (ref 65–99)
Glucose-Capillary: 151 mg/dL — ABNORMAL HIGH (ref 65–99)
Glucose-Capillary: 150 mg/dL — ABNORMAL HIGH (ref 65–99)
Glucose-Capillary: 148 mg/dL — ABNORMAL HIGH (ref 65–99)

## 2017-03-12 LAB — COMPREHENSIVE METABOLIC PANEL
ALBUMIN: 2.9 g/dL — AB (ref 3.5–5.0)
ALT: 15 U/L — AB (ref 17–63)
ANION GAP: 8 (ref 5–15)
AST: 16 U/L (ref 15–41)
Alkaline Phosphatase: 29 U/L — ABNORMAL LOW (ref 38–126)
BILIRUBIN TOTAL: 0.3 mg/dL (ref 0.3–1.2)
BUN: 9 mg/dL (ref 6–20)
CALCIUM: 9 mg/dL (ref 8.9–10.3)
CO2: 27 mmol/L (ref 22–32)
CREATININE: 0.68 mg/dL (ref 0.61–1.24)
Chloride: 102 mmol/L (ref 101–111)
GFR calc Af Amer: >60 mL/min (ref >60)
GFR calc non Af Amer: >60 mL/min (ref >60)
GLUCOSE: 135 mg/dL — AB (ref 65–99)
Potassium: 3.8 mmol/L (ref 3.5–5.1)
Sodium: 137 mmol/L (ref 135–145)
TOTAL PROTEIN: 6.9 g/dL (ref 6.5–8.1)

## 2017-03-12 LAB — MAGNESIUM: Magnesium: 2.1 mg/dL (ref 1.7–2.4)

## 2017-03-12 LAB — PHOSPHORUS: Phosphorus: 2.7 mg/dL (ref 2.5–4.6)

## 2017-03-12 MED ORDER — NALOXONE HCL 0.4 MG/ML IJ SOLN: 0.4000 mg | INTRAMUSCULAR | Status: DC | PRN

## 2017-03-12 MED ORDER — SODIUM CHLORIDE 0.9% FLUSH: 9.0000 mL | INTRAVENOUS | Status: DC | PRN

## 2017-03-12 MED ORDER — ONDANSETRON HCL 4 MG/2ML IJ SOLN: 4.0000 mg | Freq: Four times a day (QID) | INTRAMUSCULAR | Status: DC | PRN

## 2017-03-12 MED ORDER — DIPHENHYDRAMINE HCL 50 MG/ML IJ SOLN: 12.5000 mg | Freq: Four times a day (QID) | INTRAMUSCULAR | Status: DC | PRN

## 2017-03-12 MED ORDER — FAT EMULSION 20 % IV EMUL
250.0000 mL | INTRAVENOUS | Status: AC
  Administered 2017-03-12: 250 mL via INTRAVENOUS
  Filled 2017-03-12: qty 250

## 2017-03-12 MED ORDER — DIPHENHYDRAMINE HCL 12.5 MG/5ML PO ELIX
12.5000 mg | ORAL_SOLUTION | Freq: Four times a day (QID) | ORAL | Status: DC | PRN
  Filled 2017-03-12: qty 5

## 2017-03-12 MED ORDER — HYDROMORPHONE 1 MG/ML IV SOLN
INTRAVENOUS | Status: DC
  Administered 2017-03-12 (×2): 3.6 mg via INTRAVENOUS
  Administered 2017-03-12: 25 mg via INTRAVENOUS
  Administered 2017-03-13: 2.4 mg via INTRAVENOUS
  Administered 2017-03-13: 25 mg via INTRAVENOUS
  Administered 2017-03-13: 3.3 mg via INTRAVENOUS
  Administered 2017-03-14: 2.7 mg via INTRAVENOUS
  Administered 2017-03-14: 2.1 mg via INTRAVENOUS
  Administered 2017-03-14: 14:00:00 via INTRAVENOUS
  Administered 2017-03-14: 2.7 mg via INTRAVENOUS
  Administered 2017-03-14: 8.4 mg via INTRAVENOUS
  Administered 2017-03-14: 2.1 mg via INTRAVENOUS
  Administered 2017-03-14: 2.7 mg via INTRAVENOUS
  Administered 2017-03-15: 25 mg via INTRAVENOUS
  Administered 2017-03-15: 4.2 mg via INTRAVENOUS
  Administered 2017-03-15: 2.7 mg via INTRAVENOUS
  Administered 2017-03-15: 3.6 mg via INTRAVENOUS
  Filled 2017-03-12 (×3): qty 25

## 2017-03-12 MED ORDER — TRACE MINERALS CR-CU-MN-SE-ZN 10-1000-500-60 MCG/ML IV SOLN
INTRAVENOUS | Status: AC
  Administered 2017-03-12: 19:00:00 via INTRAVENOUS
  Filled 2017-03-12: qty 1992

## 2017-03-12 NOTE — Progress Notes (Addendum)
PHARMACY - ADULT TOTAL PARENTERAL NUTRITION CONSULT NOTE   Pharmacy Consult for new TPN Indication: diverticulitis/bowel obstruction  Patient Measurements: Height: 6' (182.9 cm) Weight: 160 lb 9.6 oz (72.8 kg) IBW/kg (Calculated) : 77.6 TPN AdjBW (KG): 75.3 Body mass index is 21.78 kg/m. Usual Weight:   Assessment:   GI: NPO Endo: BG 146-157; Insulin requirements in the past 24 hours: 6 units SSI q6h Lytes:  K 3.8, Mag 2.1, Phos 2.7 Renal: SCr 0.68 - stable  TPN Access: PICC TPN start date: 03/10/17 per Dr. Hampton Abbot Nutritional Goals (per RD recommendation on ): kCal: Protein:  Fluid:  Goal TPN rate is 83 ml/hr Current IVF: NS at 40 ml/hr  Current Nutrition: Clinimix E 5/20 at 83 ml/hr (goal)  Plan:  Will continue TPN at goal rate tonight - Clinimix E 5/20 at 83 ml/hr with MVI, trace elements, folic acid and thiamine (thiamine x 3 days d/t shortage). Dietician note noted.  Lipids 20% 20 ml/hr x 12 hours.    Continue SSI Lytes WNL, no supplementation needed at this time. Recheck TPN labs in AM Daily weights and strict I/O   Mina Marble, Lavonda Jumbo, Pharm.D., BCPS Clinical Pharmacist 03/12/2017,11:20 AM

## 2017-03-12 NOTE — Progress Notes (Signed)
03/12/2017  Subjective: Patient is 2 Days Post-Op status post exploratory laparotomy with lysis of adhesions.  Added Toradol yesterday due to pain issues which have improved but the patient still reports the pain of 7 out of 10.  He feels that the Toradol does help longer term basis, but the Dilaudid wears off easily.  NG tube working well and put out 700 cc yesterday.  No fevers or chills.  No bowel function yet.  Vital signs: Temp:  [98.4 F (36.9 C)-99.1 F (37.3 C)] 99.1 F (37.3 C) (01/27 0452) Pulse Rate:  [83-95] 87 (01/27 0630) Resp:  [18-20] 18 (01/27 0452) BP: (124-144)/(77-97) 143/91 (01/27 0630) SpO2:  [96 %-98 %] 98 % (01/27 0452)   Intake/Output: 01/26 0701 - 01/27 0700 In: 3076.3 [I.V.:2916.3; IV Piggyback:150] Out: 3010 [Urine:2300; Emesis/NG output:700; Drains:10] Last BM Date: 03/10/17  Physical Exam: Constitutional: No acute distress Abdomen: Soft, nondistended, with tenderness to palpation in the midline incision and at the right JP drain site insertion.  NG tube in place with gastric contents.  IR drains bilaterally with serosanguineous fluid although minimal output only.  Labs:  Recent Labs    03/11/17 0714 03/12/17 0610  WBC 17.1* 14.3*  HGB 13.9 12.6*  HCT 41.1 36.5*  PLT 545* 450*   Recent Labs    03/11/17 0701 03/12/17 0610  NA 133* 137  K 4.0 3.8  CL 98* 102  CO2 27 27  GLUCOSE 182* 135*  BUN 9 9  CREATININE 0.73 0.68  CALCIUM 8.7* 9.0   No results for input(s): LABPROT, INR in the last 72 hours.  Imaging: No results found.  Assessment/Plan: 32 year old male status post expiratory laparotomy with lysis of adhesions.  -Continue n.p.o. with IV fluid hydration and NG tube to suction given no bowel function yet. -Continue TPN today. -We will start Dilaudid PCA today given that the patient is still having significant amount of pain over his incision.  We will continue the Toradol as well but discontinue the IV Dilaudid that he was getting  every 2 hours prn. -Discontinue Foley catheter today. -Out of bed to chair today.   Melvyn Neth, [place]
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
## 2017-03-13 ENCOUNTER — Inpatient Hospital Stay: Payer: Self-pay | Admitting: General Surgery

## 2017-03-13 LAB — GLUCOSE, CAPILLARY
GLUCOSE-CAPILLARY: 132 mg/dL — AB (ref 65–99)
GLUCOSE-CAPILLARY: 128 mg/dL — AB (ref 65–99)
GLUCOSE-CAPILLARY: 119 mg/dL — AB (ref 65–99)
Glucose-Capillary: 136 mg/dL — ABNORMAL HIGH (ref 65–99)

## 2017-03-13 LAB — COMPREHENSIVE METABOLIC PANEL
ALT: 15 U/L — ABNORMAL LOW (ref 17–63)
AST: 19 U/L (ref 15–41)
Albumin: 2.8 g/dL — ABNORMAL LOW (ref 3.5–5.0)
Alkaline Phosphatase: 25 U/L — ABNORMAL LOW (ref 38–126)
Anion gap: 8 (ref 5–15)
BILIRUBIN TOTAL: 0.3 mg/dL (ref 0.3–1.2)
BUN: 10 mg/dL (ref 6–20)
CO2: 27 mmol/L (ref 22–32)
CREATININE: 0.72 mg/dL (ref 0.61–1.24)
Calcium: 9 mg/dL (ref 8.9–10.3)
Chloride: 102 mmol/L (ref 101–111)
Glucose, Bld: 137 mg/dL — ABNORMAL HIGH (ref 65–99)
Potassium: 3.7 mmol/L (ref 3.5–5.1)
Sodium: 137 mmol/L (ref 135–145)
TOTAL PROTEIN: 6.8 g/dL (ref 6.5–8.1)

## 2017-03-13 LAB — DIFFERENTIAL
BASOS PCT: 1 %
Basophils Absolute: 0.1 10*3/uL (ref 0–0.1)
EOS ABS: 0.4 10*3/uL (ref 0–0.7)
EOS PCT: 4 %
Lymphocytes Relative: 15 %
Lymphs Abs: 1.4 10*3/uL (ref 1.0–3.6)
Monocytes Absolute: 0.9 10*3/uL (ref 0.2–1.0)
Monocytes Relative: 9 %
NEUTROS PCT: 71 %
Neutro Abs: 6.9 10*3/uL — ABNORMAL HIGH (ref 1.4–6.5)

## 2017-03-13 LAB — CBC
HCT: 34.3 % — ABNORMAL LOW (ref 40.0–52.0)
Hemoglobin: 11.8 g/dL — ABNORMAL LOW (ref 13.0–18.0)
MCH: 30.2 pg (ref 26.0–34.0)
MCHC: 34.4 g/dL (ref 32.0–36.0)
MCV: 87.8 fL (ref 80.0–100.0)
PLATELETS: 342 10*3/uL (ref 150–440)
RBC: 3.91 MIL/uL — ABNORMAL LOW (ref 4.40–5.90)
RDW: 12.7 % (ref 11.5–14.5)
WBC: 9.7 10*3/uL (ref 3.8–10.6)

## 2017-03-13 LAB — TRIGLYCERIDES: TRIGLYCERIDES: 110 mg/dL (ref <150)

## 2017-03-13 LAB — PREALBUMIN: PREALBUMIN: 13.9 mg/dL — AB (ref 18–38)

## 2017-03-13 LAB — MAGNESIUM: MAGNESIUM: 2.1 mg/dL (ref 1.7–2.4)

## 2017-03-13 LAB — PHOSPHORUS: PHOSPHORUS: 3.7 mg/dL (ref 2.5–4.6)

## 2017-03-13 MED ORDER — ACETAMINOPHEN 10 MG/ML IV SOLN
1000.0000 mg | Freq: Four times a day (QID) | INTRAVENOUS | Status: AC
  Administered 2017-03-13 – 2017-03-14 (×4): 1000 mg via INTRAVENOUS
  Filled 2017-03-13 (×5): qty 100

## 2017-03-13 MED ORDER — FAT EMULSION 20 % IV EMUL
250.0000 mL | INTRAVENOUS | Status: AC
  Administered 2017-03-13: 250 mL via INTRAVENOUS
  Filled 2017-03-13: qty 250

## 2017-03-13 MED ORDER — TRACE MINERALS CR-CU-MN-SE-ZN 10-1000-500-60 MCG/ML IV SOLN
INTRAVENOUS | Status: AC
  Administered 2017-03-13: 18:00:00 via INTRAVENOUS
  Filled 2017-03-13: qty 1992

## 2017-03-13 NOTE — Progress Notes (Signed)
New PCA hung.  Old PCA had dilaudid 2mg  left in the syringe.  Waste was witnessed by [employer] and myself.  I was unable to find the yellow sheet from pharmacy to document waste on

## 2017-03-13 NOTE — Progress Notes (Signed)
PHARMACY - ADULT TOTAL PARENTERAL NUTRITION CONSULT NOTE   Pharmacy Consult for new TPN Indication: diverticulitis/bowel obstruction  Patient Measurements: Height: 6' (182.9 cm) Weight: 158 lb 6.4 oz (71.8 kg) IBW/kg (Calculated) : 77.6 TPN AdjBW (KG): 72.6 Body mass index is 21.48 kg/m.  Assessment:   GI: NPO Endo: BG 146-157; Insulin requirements in the past 24 hours: 1 unit SSI q6h Lytes:  K 3.7, Mag 2.1, Phos 3.7 Renal: SCr 0.7 - stable  TPN Access: PICC TPN start date: 03/10/17 per Dr. Hampton Abbot  Goal TPN rate is 83 ml/hr Current IVF: NS at 40 ml/hr  Current Nutrition: Clinimix E 5/20 at 83 ml/hr (goal)  Plan:  Will continue TPN at goal rate tonight - Clinimix E 5/20 at 83 ml/hr with MVI, trace elements. Dietician note noted.  Lipids 20% 20 ml/hr x 12 hours.    Continue SSI Lytes WNL, no supplementation needed at this time. Recheck TPN labs in AM Daily weights and strict I/O  Lenis Noon, [place].D., BCPS Clinical Pharmacist 03/13/2017,2:16 PM

## 2017-03-13 NOTE — Progress Notes (Signed)
POD # 3 No flatus Persistent ileus AVSS NGT 500cc   PE NAD Abd: incision c/d/i, drain serous fluid. No peritonitis A/Bs for diverticulitis  A/P ileus continue NGT, TPN, A/bs Ambulate Labs in am
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
## 2017-03-13 NOTE — Progress Notes (Signed)
It was noted that area around drain to right buttock is slightly red and swollen.  Dr Dahlia Byes notified

## 2017-03-14 LAB — BASIC METABOLIC PANEL
ANION GAP: 7 (ref 5–15)
BUN: 11 mg/dL (ref 6–20)
CALCIUM: 9.2 mg/dL (ref 8.9–10.3)
CO2: 27 mmol/L (ref 22–32)
CREATININE: 0.7 mg/dL (ref 0.61–1.24)
Chloride: 102 mmol/L (ref 101–111)
Glucose, Bld: 115 mg/dL — ABNORMAL HIGH (ref 65–99)
Potassium: 3.6 mmol/L (ref 3.5–5.1)
SODIUM: 136 mmol/L (ref 135–145)

## 2017-03-14 LAB — GLUCOSE, CAPILLARY
GLUCOSE-CAPILLARY: 91 mg/dL (ref 65–99)
Glucose-Capillary: 112 mg/dL — ABNORMAL HIGH (ref 65–99)
Glucose-Capillary: 112 mg/dL — ABNORMAL HIGH (ref 65–99)
Glucose-Capillary: 114 mg/dL — ABNORMAL HIGH (ref 65–99)
Glucose-Capillary: 106 mg/dL — ABNORMAL HIGH (ref 65–99)

## 2017-03-14 LAB — MAGNESIUM: MAGNESIUM: 1.9 mg/dL (ref 1.7–2.4)

## 2017-03-14 LAB — PHOSPHORUS: PHOSPHORUS: 4.3 mg/dL (ref 2.5–4.6)

## 2017-03-14 MED ORDER — FAT EMULSION 20 % IV EMUL
250.0000 mL | INTRAVENOUS | Status: AC
Start: 2017-03-14 — End: 2017-03-15
  Administered 2017-03-14: 250 mL via INTRAVENOUS
  Filled 2017-03-14: qty 250

## 2017-03-14 MED ORDER — FOLIC ACID 5 MG/ML IJ SOLN
INTRAVENOUS | Status: AC
  Administered 2017-03-14 (×2): via INTRAVENOUS
  Filled 2017-03-14: qty 1992

## 2017-03-14 NOTE — Progress Notes (Signed)
POD # 4 No flatus Persistent ileus AVSS NGT 300cc   PE NAD Abd: incision c/d/i, drain serous fluid. No peritonitis A/Bs for diverticulitis  A/P ileus continue NGT, TPN, A/bs Ambulate D/W him about decreasing narcotics Added IV tylenol  DC gluteal drain since collection has resolved and there has been no outpt

## 2017-03-14 NOTE — Progress Notes (Signed)
PHARMACY - ADULT TOTAL PARENTERAL NUTRITION CONSULT NOTE   Pharmacy Consult for new TPN Indication: diverticulitis/bowel obstruction  Patient Measurements: Height: 6' (182.9 cm) Weight: 157 lb 12.8 oz (71.6 kg) IBW/kg (Calculated) : 77.6 TPN AdjBW (KG): 72.6 Body mass index is 21.4 kg/m.  Assessment:   GI: NPO Endo: BG 146-157; Insulin requirements in the past 24 hours: 1 unit SSI q6h Lytes:  K 3.6, Mag 1.9, Phos 4.3 Renal: SCr 0.7 - stable  TPN Access: PICC TPN start date: 03/10/17 per Dr. Hampton Abbot  Goal TPN rate is 83 ml/hr Current IVF: NS at 40 ml/hr  Current Nutrition: Clinimix E 5/20 at 83 ml/hr (goal)  Plan:  Will continue TPN at goal rate tonight - Clinimix E 5/20 at 83 ml/hr. Patient has already received 3 days of MVI, trace elements, and thiamine - will remove from TPN. Continue thiamine 1 mg additive. Dietician note noted.  Continue Lipids 20% 20 ml/hr x 12 hours.    Continue SSI Lytes WNL, no supplementation needed at this time. Recheck TPN labs in AM Daily weights and strict I/O  Lenis Noon, [place].D., BCPS Clinical Pharmacist 03/14/2017,11:21 AM

## 2017-03-14 NOTE — Evaluation (Signed)
Physical Therapy Evaluation Patient Details Name: Martin Garcia MRN: [id number] DOB: 05-02-1985 Today's Date: 03/14/2017   History of Present Illness  Patient was admitted on 1/18 directly from the office by Dr. Adonis Huguenin for persistent diverticulitis with a new abscess and elevated WBC.  Pt diagnosed with small bowel obstruction secondary to acute diverticulitis with abscess.  Pt is now s/p exploratory Laparotomy and extensive lysis of adhesions.      Clinical Impression  Pt performed very well during PT evaluation and was really only limited by abdominal area pain.  Pt Mod I with bed mobility tasks with extra time and effort required but no physical assistance.  Pt Ind with transfers with good control and stability.  Pt able to amb 200' independently without AD with slow cadence secondary to caution related to abdominal pain but steady without LOB and with no adverse symptoms.  Pt appropriate for amb with nursing to assist with lines, leads, and tubes and will complete PT orders at this time.  Will reassess pt pending a change in status upon receipt of new PT orders.      Follow Up Recommendations No PT follow up    Equipment Recommendations  None recommended by PT    Recommendations for Other Services       Precautions / Restrictions Precautions Precautions: None Restrictions Weight Bearing Restrictions: No      Mobility  Bed Mobility Overal bed mobility: Modified Independent             General bed mobility comments: Extra time and effort with bed mobility tasks with HOB elevated and log roll technique education provided; no physical assistance needed  Transfers Overall transfer level: Independent Equipment used: None             General transfer comment: Good eccentric and concentric control during transfers with very good stability  Ambulation/Gait Ambulation/Gait assistance: Independent Ambulation Distance (Feet): 200 Feet Assistive device: None Gait  Pattern/deviations: Step-through pattern;Decreased step length - right;Decreased step length - left   Gait velocity interpretation: Below normal speed for age/gender General Gait Details: Slow, cautious gait secondary to abominal pain but steady without LOB  Stairs            Wheelchair Mobility    Modified Rankin (Stroke Patients Only)       Balance Overall balance assessment: Independent                                           Pertinent Vitals/Pain Pain Assessment: 0-10 Pain Score: 6  Pain Location: Abdominal area Pain Descriptors / Indicators: Aching;Operative site guarding;Sore Pain Intervention(s): Premedicated before session;Monitored during session;Limited activity within patient's tolerance    Home Living Family/patient expects to be discharged to:: Private residence Living Arrangements: Spouse/significant other Available Help at Discharge: Family;Available 24 hours/day Type of Home: Mobile home Home Access: Stairs to enter Entrance Stairs-Rails: Right;Left;Can reach both Entrance Stairs-Number of Steps: 6 Home Layout: One level Home Equipment: None      Prior Function Level of Independence: Independent         Comments: Pt active and Ind with all functional mobility and community ambulation, 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 [employer]   Dominant Hand: Right    Extremity/Trunk Assessment   Upper Extremity Assessment Upper Extremity Assessment: Overall WFL for tasks assessed  Lower Extremity Assessment Lower Extremity Assessment: Overall WFL for tasks assessed       Communication   Communication: No difficulties  Cognition Arousal/Alertness: Awake/alert Behavior During Therapy: WFL for tasks assessed/performed Overall Cognitive Status: Within Functional Limits for tasks assessed                                        General Comments General comments (skin  integrity, edema, etc.): Pt steady during functional dynamic standing tasks without any signs of instability    Exercises     Assessment/Plan    PT Assessment Patent does not need any further PT services  PT Problem List         PT Treatment Interventions      PT Goals (Current goals can be found in the Care Plan section)  Acute Rehab PT Goals PT Goal Formulation: All assessment and education complete, DC therapy    Frequency     Barriers to discharge        Co-evaluation               AM-PAC PT "6 Clicks" Daily Activity  Outcome Measure Difficulty turning over in bed (including adjusting bedclothes, sheets and blankets)?: A Little Difficulty moving from lying on back to sitting on the side of the bed? : A Little Difficulty sitting down on and standing up from a chair with arms (e.g., wheelchair, bedside commode, etc,.)?: None Help needed moving to and from a bed to chair (including a wheelchair)?: None Help needed walking in hospital room?: None Help needed climbing 3-5 steps with a railing? : None 6 Click Score: 22    End of Session   Activity Tolerance: Patient tolerated treatment well;No increased pain Patient left: in bed;with bed alarm set;with call bell/phone within reach Nurse Communication: Mobility status PT Visit Diagnosis: Muscle weakness (generalized) (M62.81)    Time: 7062-3762 PT Time Calculation (min) (ACUTE ONLY): 26 min   Charges:   PT Evaluation $PT Eval Low Complexity: 1 Low     PT G Codes:        DRoyetta Asal PT, DPT 03/14/17, 3:42 PM

## 2017-03-15 ENCOUNTER — Inpatient Hospital Stay: Payer: Medicaid Other

## 2017-03-15 LAB — CBC
HEMATOCRIT: 34.6 % — AB (ref 40.0–52.0)
HEMOGLOBIN: 12.1 g/dL — AB (ref 13.0–18.0)
MCH: 30.5 pg (ref 26.0–34.0)
MCHC: 35 g/dL (ref 32.0–36.0)
MCV: 87.2 fL (ref 80.0–100.0)
Platelets: 386 10*3/uL (ref 150–440)
RBC: 3.97 MIL/uL — ABNORMAL LOW (ref 4.40–5.90)
RDW: 12.5 % (ref 11.5–14.5)
WBC: 9.5 10*3/uL (ref 3.8–10.6)

## 2017-03-15 LAB — BASIC METABOLIC PANEL
ANION GAP: 9 (ref 5–15)
BUN: 13 mg/dL (ref 6–20)
CO2: 26 mmol/L (ref 22–32)
Calcium: 9.5 mg/dL (ref 8.9–10.3)
Chloride: 100 mmol/L — ABNORMAL LOW (ref 101–111)
Creatinine, Ser: 0.76 mg/dL (ref 0.61–1.24)
GFR calc Af Amer: >60 mL/min (ref >60)
GFR calc non Af Amer: >60 mL/min (ref >60)
Glucose, Bld: 106 mg/dL — ABNORMAL HIGH (ref 65–99)
POTASSIUM: 3.8 mmol/L (ref 3.5–5.1)
Sodium: 135 mmol/L (ref 135–145)

## 2017-03-15 LAB — GLUCOSE, CAPILLARY
Glucose-Capillary: 105 mg/dL — ABNORMAL HIGH (ref 65–99)
Glucose-Capillary: 108 mg/dL — ABNORMAL HIGH (ref 65–99)
Glucose-Capillary: 126 mg/dL — ABNORMAL HIGH (ref 65–99)
Glucose-Capillary: 79 mg/dL (ref 65–99)

## 2017-03-15 LAB — PHOSPHORUS: Phosphorus: 4.7 mg/dL — ABNORMAL HIGH (ref 2.5–4.6)

## 2017-03-15 LAB — MAGNESIUM: MAGNESIUM: 2 mg/dL (ref 1.7–2.4)

## 2017-03-15 MED ORDER — CLINIMIX E/DEXTROSE (5/20) 5 % IV SOLN
INTRAVENOUS | Status: AC
  Administered 2017-03-15: 14:00:00 via INTRAVENOUS
  Filled 2017-03-15: qty 415

## 2017-03-15 MED ORDER — FAT EMULSION 20 % IV EMUL
360.0000 mL | INTRAVENOUS | Status: AC
  Administered 2017-03-15: 360 mL via INTRAVENOUS
  Filled 2017-03-15: qty 360

## 2017-03-15 MED ORDER — ACETAMINOPHEN 10 MG/ML IV SOLN
1000.0000 mg | Freq: Four times a day (QID) | INTRAVENOUS | Status: AC
  Administered 2017-03-15 – 2017-03-16 (×4): 1000 mg via INTRAVENOUS
  Filled 2017-03-15 (×4): qty 100

## 2017-03-15 MED ORDER — CLINIMIX E/DEXTROSE (5/20) 5 % IV SOLN: INTRAVENOUS | Status: DC

## 2017-03-15 MED ORDER — FOLIC ACID 5 MG/ML IJ SOLN
INTRAVENOUS | Status: AC
  Administered 2017-03-15: 18:00:00 via INTRAVENOUS
  Filled 2017-03-15: qty 1992

## 2017-03-15 MED ORDER — OXYCODONE HCL 5 MG PO TABS
10.0000 mg | ORAL_TABLET | ORAL | Status: DC | PRN
  Administered 2017-03-16 – 2017-03-19 (×17): 10 mg via ORAL
  Filled 2017-03-15 (×17): qty 2
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
## 2017-03-15 NOTE — Progress Notes (Signed)
PHARMACY - ADULT TOTAL PARENTERAL NUTRITION CONSULT NOTE   Pharmacy Consult for new TPN Indication: diverticulitis/bowel obstruction  Patient Measurements: Height: 6' (182.9 cm) Weight: 147 lb 0.8 oz (66.7 kg) IBW/kg (Calculated) : 77.6 TPN AdjBW (KG): 72.6 Body mass index is 19.94 kg/m.  Assessment:   GI: NPO Endo: BG 146-157; Insulin requirements in the past 24 hours: 1 unit SSI q6h Lytes:  K 3.8, Mag 2, Phos 4.7 is a slightly elevated Renal: SCr 0.7 - stable  TPN Access: PICC TPN start date: 03/10/17 per Dr. Hampton Abbot  Goal TPN rate is 83 ml/hr Current IVF: NS at 40 ml/hr  Current Nutrition: Clinimix E 5/20 at 83 ml/hr (goal)  Plan:  Will continue TPN at goal rate tonight - Clinimix E 5/20 at 83 ml/hr. Patient has already received 3 days of MVI, trace elements, and thiamine - will remove from TPN. Continue folic acid 1 mg additive. Dietician note noted.  Increase Lipids 20% to 30 ml/hr x 12 hours per RD recommendation.  Continue SSI Lytes WNL, no supplementation needed at this time. Phosphorus slightly elevated - will recheck with AM labs tomorrow and discussed posiibility of removing electrolytes from TPN with RD if they remain elevated.  Recheck TPN labs in AM Daily weights and strict I/O  Lenis Noon, [place].D., BCPS Clinical Pharmacist 03/15/2017,2:46 PM

## 2017-03-15 NOTE — Progress Notes (Signed)
Persistent ileus D/w hiom about decreasing dilaudid dose KUB dilated loops contrast reaches the colon  PE NAD Abd: soft, staples intact. No infection.  A/p Ileus, clamp NGT Mobilize Decrease narcotics IV acetaminophen

## 2017-03-16 LAB — GLUCOSE, CAPILLARY
GLUCOSE-CAPILLARY: 102 mg/dL — AB (ref 65–99)
GLUCOSE-CAPILLARY: 114 mg/dL — AB (ref 65–99)
Glucose-Capillary: 116 mg/dL — ABNORMAL HIGH (ref 65–99)
Glucose-Capillary: 123 mg/dL — ABNORMAL HIGH (ref 65–99)

## 2017-03-16 LAB — COMPREHENSIVE METABOLIC PANEL
ALK PHOS: 67 U/L (ref 38–126)
ALT: 35 U/L (ref 17–63)
AST: 29 U/L (ref 15–41)
Albumin: 3.5 g/dL (ref 3.5–5.0)
Anion gap: 12 (ref 5–15)
BILIRUBIN TOTAL: 0.3 mg/dL (ref 0.3–1.2)
BUN: 16 mg/dL (ref 6–20)
CALCIUM: 9.9 mg/dL (ref 8.9–10.3)
CO2: 23 mmol/L (ref 22–32)
Chloride: 100 mmol/L — ABNORMAL LOW (ref 101–111)
Creatinine, Ser: 0.74 mg/dL (ref 0.61–1.24)
GFR calc non Af Amer: >60 mL/min (ref >60)
Glucose, Bld: 123 mg/dL — ABNORMAL HIGH (ref 65–99)
POTASSIUM: 3.9 mmol/L (ref 3.5–5.1)
Sodium: 135 mmol/L (ref 135–145)
TOTAL PROTEIN: 8.1 g/dL (ref 6.5–8.1)

## 2017-03-16 LAB — MAGNESIUM: MAGNESIUM: 2 mg/dL (ref 1.7–2.4)

## 2017-03-16 LAB — PHOSPHORUS: PHOSPHORUS: 4.4 mg/dL (ref 2.5–4.6)

## 2017-03-16 MED ORDER — KETOROLAC TROMETHAMINE 30 MG/ML IJ SOLN
30.0000 mg | Freq: Four times a day (QID) | INTRAMUSCULAR | Status: DC | PRN
  Administered 2017-03-16 – 2017-03-19 (×7): 30 mg via INTRAVENOUS
  Filled 2017-03-16 (×7): qty 1

## 2017-03-16 MED ORDER — FAT EMULSION 20 % IV EMUL
360.0000 mL | INTRAVENOUS | Status: AC
  Administered 2017-03-16: 360 mL via INTRAVENOUS
  Filled 2017-03-16: qty 360

## 2017-03-16 MED ORDER — ACETAMINOPHEN 500 MG PO TABS
1000.0000 mg | ORAL_TABLET | Freq: Four times a day (QID) | ORAL | Status: DC
  Administered 2017-03-16: 1000 mg via ORAL
  Filled 2017-03-16: qty 2

## 2017-03-16 MED ORDER — HYDROMORPHONE HCL 1 MG/ML IJ SOLN
0.5000 mg | INTRAMUSCULAR | Status: DC | PRN
  Administered 2017-03-16 – 2017-03-17 (×11): 0.5 mg via INTRAVENOUS
  Filled 2017-03-16 (×11): qty 0.5

## 2017-03-16 MED ORDER — GABAPENTIN 400 MG PO CAPS
400.0000 mg | ORAL_CAPSULE | Freq: Three times a day (TID) | ORAL | Status: DC
  Administered 2017-03-16 (×3): 400 mg via ORAL
  Filled 2017-03-16: qty 4
  Filled 2017-03-16 (×2): qty 1

## 2017-03-16 MED ORDER — ACETAMINOPHEN 500 MG PO TABS
1000.0000 mg | ORAL_TABLET | Freq: Four times a day (QID) | ORAL | Status: DC
  Administered 2017-03-16 – 2017-03-19 (×10): 1000 mg via ORAL
  Filled 2017-03-16 (×12): qty 2

## 2017-03-16 MED ORDER — FOLIC ACID 5 MG/ML IJ SOLN
INTRAVENOUS | Status: AC
  Administered 2017-03-16: 18:00:00 via INTRAVENOUS
  Filled 2017-03-16: qty 1992

## 2017-03-16 NOTE — Progress Notes (Signed)
PHARMACY - ADULT TOTAL PARENTERAL NUTRITION CONSULT NOTE   Pharmacy Consult for new TPN Indication: diverticulitis/bowel obstruction  Patient Measurements: Height: 6' (182.9 cm) Weight: 154 lb 4.8 oz (70 kg) IBW/kg (Calculated) : 77.6 TPN AdjBW (KG): 72.6 Body mass index is 20.93 kg/m.  Assessment:   GI: NPO Endo: BG 146-157; Insulin requirements in the past 24 hours: None SSI q6h Lytes:  K 3.9, Mag 2, Phos 4.4 WNL Renal: SCr 0.7 - stable  TPN Access: PICC TPN start date: 03/10/17 per Dr. Hampton Abbot  Goal TPN rate is 83 ml/hr Current IVF: NS at 40 ml/hr  Current Nutrition: Clinimix E 5/20 at 83 ml/hr (goal)  Plan:  Will continue TPN at goal rate - Clinimix E 5/20 at 83 ml/hr.  Patient has already received 3 days of MVI, trace elements, and thiamine - will remove from TPN.  Continue folic acid 1 mg additive. Dietician note noted.  Continue Lipids 20% to 30 ml/hr x 12 hours per RD recommendation.  Continue SSI Lytes WNL, no supplementation needed at this time.   Recheck TPN labs in AM Daily weights and strict I/O  Lenis Noon, [place].D., BCPS Clinical Pharmacist 03/16/2017,2:28 PM

## 2017-03-16 NOTE — Progress Notes (Signed)
POD # 6 Ileus resolving + flatus AVSS Tolerating NG clamping  PE NAD Abd: incision c/d/i, drain serous fluid. No peritonitis   A/P resolving ileus continue, clears    TPN for now DC a/bs as he completed course DC PCA Ambulate DC 24-48 hrs

## 2017-03-17 LAB — GLUCOSE, CAPILLARY
GLUCOSE-CAPILLARY: 105 mg/dL — AB (ref 65–99)
GLUCOSE-CAPILLARY: 105 mg/dL — AB (ref 65–99)
GLUCOSE-CAPILLARY: 125 mg/dL — AB (ref 65–99)
Glucose-Capillary: 145 mg/dL — ABNORMAL HIGH (ref 65–99)

## 2017-03-17 LAB — BASIC METABOLIC PANEL
Anion gap: 12 (ref 5–15)
BUN: 17 mg/dL (ref 6–20)
CHLORIDE: 101 mmol/L (ref 101–111)
CO2: 24 mmol/L (ref 22–32)
Calcium: 10 mg/dL (ref 8.9–10.3)
Creatinine, Ser: 0.85 mg/dL (ref 0.61–1.24)
GFR calc Af Amer: >60 mL/min (ref >60)
GFR calc non Af Amer: >60 mL/min (ref >60)
GLUCOSE: 129 mg/dL — AB (ref 65–99)
POTASSIUM: 4.2 mmol/L (ref 3.5–5.1)
SODIUM: 137 mmol/L (ref 135–145)

## 2017-03-17 LAB — MAGNESIUM: Magnesium: 2 mg/dL (ref 1.7–2.4)

## 2017-03-17 LAB — PHOSPHORUS: Phosphorus: 4.5 mg/dL (ref 2.5–4.6)

## 2017-03-17 MED ORDER — FAT EMULSION 20 % IV EMUL
360.0000 mL | INTRAVENOUS | Status: AC
  Administered 2017-03-17: 360 mL via INTRAVENOUS
  Filled 2017-03-17: qty 360

## 2017-03-17 MED ORDER — POLYETHYLENE GLYCOL 3350 17 G PO PACK
17.0000 g | PACK | Freq: Two times a day (BID) | ORAL | Status: DC
  Administered 2017-03-17 – 2017-03-18 (×3): 17 g via ORAL
  Filled 2017-03-17 (×3): qty 1

## 2017-03-17 MED ORDER — GABAPENTIN 600 MG PO TABS
600.0000 mg | ORAL_TABLET | Freq: Three times a day (TID) | ORAL | Status: DC
  Administered 2017-03-17 – 2017-03-19 (×7): 600 mg via ORAL
  Filled 2017-03-17 (×7): qty 1

## 2017-03-17 MED ORDER — TRACE MINERALS CR-CU-MN-SE-ZN 10-1000-500-60 MCG/ML IV SOLN
INTRAVENOUS | Status: AC
  Administered 2017-03-17: 18:00:00 via INTRAVENOUS
  Filled 2017-03-17: qty 1992

## 2017-03-17 NOTE — Care Management (Signed)
Additional copies of application to Medication Management and [place] provided.  RNCM following for discharge planning needs
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
## 2017-03-17 NOTE — Progress Notes (Signed)
POD #7 Ileus resolving + flatus AVSS Tolerating some clear  But not drinking too much Minimal output from drains  PE NAD Abd: incision c/d/i, drain serous fluid. No peritonitis. I removed Left buttocks drain.   A/P resolving ileus, advance to fulls   Start weaning TPN tomorrow d/w dietician Ambulate DC 24-48 hrs

## 2017-03-17 NOTE — Progress Notes (Signed)
PHARMACY - ADULT TOTAL PARENTERAL NUTRITION CONSULT NOTE   Pharmacy Consult for new TPN Indication: diverticulitis/bowel obstruction  Patient Measurements: Height: 6' (182.9 cm) Weight: 155 lb (70.3 kg) IBW/kg (Calculated) : 77.6 TPN AdjBW (KG): 72.6 Body mass index is 21.02 kg/m.  Assessment:   GI: NPO Endo: BG 146-157; Insulin requirements in the past 24 hours: 1 unit SSI q6h Lytes:  K 4.2, Mag 2, Phos 4.5 are all WNL Renal: SCr 0.8 - stable  TPN Access: PICC TPN start date: 03/10/17 per Dr. Hampton Abbot  Goal TPN rate is 83 ml/hr Current IVF: NS at 40 ml/hr  Current Nutrition: Clinimix E 5/20 at 83 ml/hr (goal)  Plan:  Will continue TPN at goal rate - Clinimix E 5/20 at 83 ml/hr.  MVI and trace elements in TPN Continue Lipids 20% at 30 ml/hr x 12 hours.  Dietician note noted.   Continue SSI Lytes WNL, no supplementation needed at this time.   Recheck TPN labs in AM Daily weights and strict I/O  Lenis Noon, [place].D., BCPS Clinical Pharmacist 03/17/2017,1:34 PM

## 2017-03-18 LAB — GLUCOSE, CAPILLARY
GLUCOSE-CAPILLARY: 111 mg/dL — AB (ref 65–99)
GLUCOSE-CAPILLARY: 119 mg/dL — AB (ref 65–99)
Glucose-Capillary: 116 mg/dL — ABNORMAL HIGH (ref 65–99)
Glucose-Capillary: 78 mg/dL (ref 65–99)

## 2017-03-18 MED ORDER — TRACE MINERALS CR-CU-MN-SE-ZN 10-1000-500-60 MCG/ML IV SOLN
INTRAVENOUS | Status: DC
  Administered 2017-03-18: 18:00:00 via INTRAVENOUS
  Filled 2017-03-18: qty 1992

## 2017-03-18 MED ORDER — HYDROMORPHONE HCL 1 MG/ML IJ SOLN
0.5000 mg | INTRAMUSCULAR | Status: DC | PRN
  Administered 2017-03-18 – 2017-03-19 (×6): 0.5 mg via INTRAVENOUS
  Filled 2017-03-18 (×6): qty 1

## 2017-03-18 MED ORDER — FAT EMULSION 20 % IV EMUL
250.0000 mL | INTRAVENOUS | Status: AC
  Administered 2017-03-18: 250 mL via INTRAVENOUS
  Filled 2017-03-18: qty 250

## 2017-03-18 NOTE — Progress Notes (Signed)
PHARMACY - ADULT TOTAL PARENTERAL NUTRITION CONSULT NOTE   Pharmacy Consult for new TPN Indication: diverticulitis/bowel obstruction  Patient Measurements: Height: 6' (182.9 cm) Weight: 160 lb 4.4 oz (72.7 kg) IBW/kg (Calculated) : 77.6 TPN AdjBW (KG): 72.6 Body mass index is 21.74 kg/m.  Assessment:   GI: NPO Endo: BG 146-157; Insulin requirements in the past 24 hours: 1 unit SSI q6h Lytes:  K 4.2, Mag 2, Phos 4.5 are all WNL Renal: SCr 0.8 - stable  TPN Access: PICC TPN start date: 03/10/17 per Dr. Hampton Abbot  Goal TPN rate is 83 ml/hr Current IVF: NS at 40 ml/hr  Current Nutrition: Clinimix E 5/20 at 83 ml/hr (goal)  2/2 Plan:  Recommend Ensure Enlive po TID, each supplement provides 350 kcal and 20 grams of protein  Decrease Clinimix E 5/20 with electrolytes to 40 mL/hr - Plan to discontinue 2/3  Continue 20% ILE @ 20 mL/hr over 12 hours- Discontinue 2/3    Continue adult MVI, trace elements, and folic acid 1 mg as TPN additives.  Daily weights   Dietician note noted.   Continue SSI Lytes WNL, no supplementation needed at this time.   Olivia Canter, [facility] Clinical Pharmacist 03/18/2017,10:27 AM

## 2017-03-18 NOTE — Progress Notes (Signed)
8 Days Post-Op   Subjective: Patient reports that although he still has pain he continues to improve.  He has been tolerating his full liquid diet and passing flatus.  He states he had a small bowel movement earlier this morning.  He is eager to try something more substantial to eat.  He is still on TPN.  Vital signs in last 24 hours: Temp:  [97.6 F (36.4 C)-98.1 F (36.7 C)] 97.6 F (36.4 C) (02/02 0425) Pulse Rate:  [95-98] 96 (02/02 0425) Resp:  [16-18] 18 (02/02 0425) BP: (128-139)/(89-97) 128/97 (02/02 0425) SpO2:  [100 %] 100 % (02/02 0425) Weight:  [70.3 kg (155 lb)-72.7 kg (160 lb 4.4 oz)] 72.7 kg (160 lb 4.4 oz) (02/02 0425) Last BM Date: 03/18/17  Intake/Output from previous day: 02/01 0701 - 02/02 0700 In: 3889.5 [P.O.:120; I.V.:3769.5] Out: 1577 [Urine:1575; Drains:2]  GI: Abdomen is soft, appropriately tender to palpation in his output.  No evidence of erythema or drainage.  Lab Results:  CBC No results for input(s): WBC, HGB, HCT, PLT in the last 72 hours. CMP     Component Value Date/Time   NA 137 03/17/2017 0525   NA 138 06/13/2014 1837   K 4.2 03/17/2017 0525   K 3.7 06/13/2014 1837   CL 101 03/17/2017 0525   CL 105 06/13/2014 1837   CO2 24 03/17/2017 0525   CO2 24 06/13/2014 1837   GLUCOSE 129 (H) 03/17/2017 0525   GLUCOSE 91 06/13/2014 1837   BUN 17 03/17/2017 0525   BUN 10 06/13/2014 1837   CREATININE 0.85 03/17/2017 0525   CREATININE 0.73 06/13/2014 1837   CALCIUM 10.0 03/17/2017 0525   CALCIUM 8.4 (L) 06/13/2014 1837   PROT 8.1 03/16/2017 0942   PROT 7.8 06/13/2014 1837   ALBUMIN 3.5 03/16/2017 0942   ALBUMIN 4.6 06/13/2014 1837   AST 29 03/16/2017 0942   AST 42 (H) 06/13/2014 1837   ALT 35 03/16/2017 0942   ALT 21 06/13/2014 1837   ALKPHOS 67 03/16/2017 0942   ALKPHOS 56 06/13/2014 1837   BILITOT 0.3 03/16/2017 0942   BILITOT 0.4 06/13/2014 1837   GFRNONAA >60 03/17/2017 0525   GFRNONAA >60 06/13/2014 1837   GFRAA >60 03/17/2017 0525    GFRAA >60 06/13/2014 1837   PT/INR No results for input(s): LABPROT, INR in the last 72 hours.  Studies/Results: No results found.  Assessment/Plan: 32 year old male status post exploratory laparotomy with a lysis of adhesions.  Showing evidence of bowel function.  Discussed with the patient the need to slowly wean off the TPN.  Once he is off the TPN and tolerating a diet with bowel function he will be ready for discharge.  Discussed that it would likely be another day before he is ready for discharge.  Encourage ambulation, incentive spirometer usage to help coordinate with Dietary for the TPN ween.   Clayburn Pert, MD [facility]  Day [facility] [phone] Night [facility] [phone]  03/18/2017
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
## 2017-03-19 LAB — GLUCOSE, CAPILLARY
GLUCOSE-CAPILLARY: 104 mg/dL — AB (ref 65–99)
Glucose-Capillary: 83 mg/dL (ref 65–99)

## 2017-03-19 MED ORDER — CIPROFLOXACIN HCL 500 MG PO TABS: 500.0000 mg | ORAL_TABLET | Freq: Two times a day (BID) | ORAL | 1 refills | Status: DC

## 2017-03-19 MED ORDER — OXYCODONE-ACETAMINOPHEN 5-325 MG PO TABS: 1.0000 | ORAL_TABLET | Freq: Four times a day (QID) | ORAL | 0 refills | Status: DC | PRN

## 2017-03-19 MED ORDER — METRONIDAZOLE 500 MG PO TABS: 500.0000 mg | ORAL_TABLET | Freq: Three times a day (TID) | ORAL | 1 refills | Status: DC

## 2017-03-19 NOTE — Progress Notes (Signed)
Nursing called me stating that the patient wants to go home now and is feeling better.  His TPN is off and he has been transition to a soft diet.  I will proceed with discharge instructions.

## 2017-03-19 NOTE — Discharge Summary (Signed)
Physician Discharge Summary  Patient ID: Martin Garcia MRN: [id number] DOB/AGE: [birth year] 32 y.o.  Admit date: 03/03/2017 Discharge date: 03/19/2017   Discharge Diagnoses:  Active Problems:   Acute diverticulitis   Small bowel obstruction (HCC)   Abscess   Procedures: Exploratory laparotomy for bowel obstruction  Hospital Course: This patient admitted to the hospital with pelvic abscesses secondary to acute diverticulitis with perforation.  Drainage was performed of a pelvic abscess but then the patient developed a considerable ileus possible bowel obstruction.  He was taken the operating room where lysis of adhesions was performed.  Currently he is tolerating a soft diet and is in stable condition.  He is sent home on oral Cipro and Flagyl with oral analgesics for pain and will follow up with Dr. Hampton Abbot in 10 days.  He is instructed to shower and perform routine drain care.  Consults: None  Disposition: 01-Home or Self Care   Allergies as of 03/19/2017      Reactions   Tramadol Nausea And Vomiting      Medication List    STOP taking these medications   amoxicillin-clavulanate 875-125 MG tablet Commonly known as:  AUGMENTIN     TAKE these medications   ciprofloxacin 500 MG tablet Commonly known as:  CIPRO Take 1 tablet (500 mg total) by mouth 2 (two) times daily.   ibuprofen 200 MG tablet Commonly known as:  ADVIL,MOTRIN Take 200 mg by mouth every 6 (six) hours as needed.   metroNIDAZOLE 500 MG tablet Commonly known as:  FLAGYL Take 1 tablet (500 mg total) by mouth 3 (three) times daily.   oxyCODONE-acetaminophen 5-325 MG tablet Commonly known as:  PERCOCET/ROXICET Take 1-2 tablets by mouth every 6 (six) hours as needed for moderate pain or severe pain.      Follow-up Information    Piscoya, Jacqulyn Bath, MD Follow up in 10 day(s).   Specialty:  Surgery Contact information: [place] [address] [phone]           Florene Glen, MD, FACS

## 2017-03-19 NOTE — Discharge Instructions (Signed)
May shower Soft diet Routine drain care Follow up with Dr. Hampton Abbot in 10 days

## 2017-03-19 NOTE — Progress Notes (Signed)
9 Days Post-Op  Subjective: Status post exploratory laparotomy and lysis of adhesions for bowel obstruction secondary to acute diverticulitis.  Patient feels fairly well today but still having some pain.  No nausea.  He is only taking full liquids at this time.  He is being transitioned off of his TPN.  Objective: Vital signs in last 24 hours: Temp:  [98.1 F (36.7 C)] 98.1 F (36.7 C) (02/03 0459) Pulse Rate:  [74-99] 74 (02/03 0459) Resp:  [18-20] 18 (02/03 0459) BP: (122-137)/(73-87) 122/77 (02/03 0459) SpO2:  [98 %-99 %] 98 % (02/03 0459) Last BM Date: 03/18/17  Intake/Output from previous day: 02/02 0701 - 02/03 0700 In: 3163.3 [P.O.:420; I.V.:2743.3] Out: 6195 [Urine:1550; Drains:5] Intake/Output this shift: Total I/O In: 229 [I.V.:229] Out: -   Physical exam:  Vital signs are stable and reviewed abdomen is soft nondistended nontympanitic and nontender wound is clean and drain is in place.  Nontender calves  Lab Results: CBC  No results for input(s): WBC, HGB, HCT, PLT in the last 72 hours. BMET Recent Labs    03/16/17 0942 03/17/17 0525  NA 135 137  K 3.9 4.2  CL 100* 101  CO2 23 24  GLUCOSE 123* 129*  BUN 16 17  CREATININE 0.74 0.85  CALCIUM 9.9 10.0   PT/INR No results for input(s): LABPROT, INR in the last 72 hours. ABG No results for input(s): PHART, HCO3 in the last 72 hours.  Invalid input(s): PCO2, PO2  Studies/Results: No results found.  Anti-infectives: Anti-infectives (From admission, onward)   Start     Dose/Rate Route Frequency Ordered Stop   03/10/17 1324  piperacillin-tazobactam (ZOSYN) 3.375 GM/50ML IVPB    Comments:  Slemenda, Debra   : cabinet override      03/10/17 1324 03/10/17 1420   03/08/17 2200  piperacillin-tazobactam (ZOSYN) IVPB 3.375 g  Status:  Discontinued     3.375 g 12.5 mL/hr over 240 Minutes Intravenous Every 8 hours 03/08/17 1811 03/16/17 1127   03/06/17 1000  amoxicillin-clavulanate (AUGMENTIN) 875-125 MG per  tablet 1 tablet  Status:  Discontinued     1 tablet Oral Every 12 hours 03/06/17 0758 03/08/17 1811   03/03/17 1300  piperacillin-tazobactam (ZOSYN) IVPB 3.375 g  Status:  Discontinued     3.375 g 12.5 mL/hr over 240 Minutes Intravenous Every 8 hours 03/03/17 1256 03/06/17 0758      Assessment/Plan: s/p Procedure(s): EXPLORATORY LAPAROTOMY   We will advance diet today and discontinue TPN probably home tomorrow.  Florene Glen, MD, FACS  03/19/2017

## 2017-03-24 ENCOUNTER — Telehealth: Payer: Self-pay | Admitting: Surgery

## 2017-03-24 MED ORDER — OXYCODONE-ACETAMINOPHEN 5-325 MG PO TABS: 1.0000 | ORAL_TABLET | Freq: Four times a day (QID) | ORAL | 0 refills | Status: DC | PRN

## 2017-03-24 NOTE — Telephone Encounter (Signed)
Patient is noticing improvement each day, however he still has some pain and discomfort. He denies fever. Appetite ok, soft diet. Staying hydrated.  Bowel movements are good. He is till taking his Antibiotics. He has tried the ibuprofen and tylenol.   Called patient back to let him know he can come pick up prescription around 3pm today.

## 2017-03-24 NOTE — Telephone Encounter (Signed)
Patient has called and has requested a refill on pain medication. Patient had an exploratory laparotomy and extensive lysis of adhesions on 03/10/17 with Dr Hampton Abbot. Patient has drain placed.   He continues to have pain and finished pain medication last night. He states that he has taken his medication as prescribed. His post op appointment is scheduled with Dr Hampton Abbot on 03/30/17.  Patient is aware that he will have to come to the office to pick up the Rx.

## 2017-03-27 ENCOUNTER — Telehealth: Payer: Self-pay | Admitting: Surgery

## 2017-03-27 ENCOUNTER — Encounter: Payer: Self-pay | Admitting: Surgery

## 2017-03-27 NOTE — Telephone Encounter (Signed)
Patient is calling asking if he could get a refill on his pain medication, pain level is about a six, patient denies vomiting and nausea, he's asking for a couple more days, patient is due to come in on Thursday to see Dr. Hampton Abbot please call patient and advise.

## 2017-03-27 NOTE — Telephone Encounter (Signed)
Patient is calling to get a refill of pain medication at this time.  Patient was given a refill of 10 Oxycodone  on Friday per Dr.Davis to get him through to his appointment on 03/30/17 with Dr.Piscoya.   Patient was offered an appointment today with Dr.Piscoya's scheduel at 3 pm -he will call back if he can find a ride. Patient stated  he is taking Tylenol and ibuprofen as well.

## 2017-03-30 ENCOUNTER — Encounter: Payer: Self-pay | Admitting: Surgery

## 2017-03-30 ENCOUNTER — Ambulatory Visit (INDEPENDENT_AMBULATORY_CARE_PROVIDER_SITE_OTHER): Payer: Self-pay | Admitting: Surgery

## 2017-03-30 ENCOUNTER — Telehealth: Payer: Self-pay | Admitting: Surgery

## 2017-03-30 VITALS — BP 137/93 | HR 120 | Temp 98.0°F | Ht 72.0 in | Wt 153.0 lb

## 2017-03-30 DIAGNOSIS — Z09 Encounter for follow-up examination after completed treatment for conditions other than malignant neoplasm: Secondary | ICD-10-CM

## 2017-03-30 MED ORDER — IBUPROFEN 800 MG PO TABS: 800.0000 mg | ORAL_TABLET | Freq: Three times a day (TID) | ORAL | 0 refills | Status: DC | PRN

## 2017-03-30 NOTE — Progress Notes (Signed)
03/30/2017  HPI: Patient is status post exploratory laparotomy with lysis of adhesions on 03/10/17.  Patient had presented to the hospital with perforated diverticulitis with large abscess that required percutaneous drainage and this resulted in a small bowel obstruction due to the significant inflammation response.  He has been doing better now and although still having some abdominal pain, denies having any fevers or chills.  Has been having regular bowel movements.  The percutaneous drains from his diverticulitis were removed prior to discharge from the hospital and he still has a JP drain from the surgery.  Has been having low volume output.  Vital signs: BP (!) 137/93   Pulse (!) 120   Temp 98 F (36.7 C) (Oral)   Ht 6' (1.829 m)   Wt 69.4 kg (153 lb)   BMI 20.75 kg/m    Physical Exam: Constitutional: No acute distress Abdomen: Soft, nondistended, probably tender to palpation.  Midline incision is clean dry and intact with staples in place.  Right lower quadrant JP drain with serosanguineous fluid.  Drain was removed with no complications.  Staples were removed with no complications and replaced by Steri-Strips.  Assessment/Plan: 32 year old male with diverticulitis with abscess resulting in small bowel obstruction, status post exploratory laparotomy with lysis of adhesions.  Discussed with the patient that for pain control, he will get a prescription for ibuprofen 800 mg every 8 hours as needed for pain.  I would hesitate to give him narcotics since he is 3 weeks out of surgery.  With regards to his diverticulitis, the patient still very hesitant about having any further surgery at this point.  Did discuss with the patient that given his significant episode of diverticulitis with abscess, that he is at risk of this developing in the future again.  However is uncertain to say when this could happen again or if.  However he is at risk.  He does understand this.  At this point there is no  urgency for surgery and discussed with the patient that what we would want to do is have a colonoscopy done in 2 months to make sure there is nothing else suspicious with his colon that would make having surgery more of priority.  He will think about his surgery his options at home and call us in the future if he is willing to have surgery.  Also discussed with him return precautions particularly given his episode of diverticulitis, such as fevers, chills, worsening pain in the left lower quadrant, nausea, vomiting.  He is to call us if any of the symptoms happen to that we can see him right away in the office.  Patient understands this plan and all of his questions have been answered.  We will schedule colonoscopy for 2 months.  We will also give him a work note so he can go back to work this coming Monday 2/18 with lighter duties and be able to resume regular duties in 3 more weeks.   Melvyn Neth, [place]

## 2017-03-30 NOTE — Patient Instructions (Signed)
Please take Ibuprofen 800 MG every 8 hours as needed.  Please give [place] a call in case you feel that you are getting a flare up so we could call you in antibiotics.  We will schedule you a colonoscopy in about 2 months to make sure that you don't have anything else.  We will schedule you an appointment with Dr. Hampton Abbot to discuss colonoscopy if anything wrong showed up.   Diverticulitis Diverticulitis is when small pockets in your large intestine (colon) get infected or swollen. This causes stomach pain and watery poop (diarrhea). These pouches are called diverticula. They form in people who have a condition called diverticulosis. Follow these instructions at home: Medicines  Take over-the-counter and prescription medicines only as told by your doctor. These include: ? Antibiotics. ? Pain medicines. ? Fiber pills. ? Probiotics. ? Stool softeners.  Do not drive or use heavy machinery while taking prescription pain medicine.  If you were prescribed an antibiotic, take it as told. Do not stop taking it even if you feel better. General instructions  Follow a diet as told by your doctor.  When you feel better, your doctor may tell you to change your diet. You may need to eat a lot of fiber. Fiber makes it easier to poop (have bowel movements). Healthy foods with fiber include: ? Berries. ? Beans. ? Lentils. ? Green vegetables.  Exercise 3 or more times a week. Aim for 30 minutes each time. Exercise enough to sweat and make your heart beat faster.  Keep all follow-up visits as told. This is important. You may need to have an exam of the large intestine. This is called a colonoscopy. Contact a doctor if:  Your pain does not get better.  You have a hard time eating or drinking.  You are not pooping like normal. Get help right away if:  Your pain gets worse.  Your problems do not get better.  Your problems get worse very fast.  You have a fever.  You throw up (vomit) more  than one time.  You have poop that is: ? Bloody. ? Black. ? Tarry. Summary  Diverticulitis is when small pockets in your large intestine (colon) get infected or swollen.  Take medicines only as told by your doctor.  Follow a diet as told by your doctor. This information is not intended to replace advice given to you by your health care provider. Make sure you discuss any questions you have with your health care provider. Document Released: 07/20/2007 Document Revised: 02/18/2016 Document Reviewed: 02/18/2016 Elsevier Interactive Patient Education  2017 [employer].

## 2017-03-30 NOTE — Telephone Encounter (Signed)
Patient's calling has a couple questions he needs to ask. Please call patient and advise.

## 2017-03-31 NOTE — Telephone Encounter (Signed)
Called patient back and left him a voicemail to call us back.

## 2017-04-03 NOTE — Telephone Encounter (Signed)
Called patient back but had to leave him a voicemail.

## 2017-04-06 ENCOUNTER — Telehealth: Payer: Self-pay

## 2017-04-06 ENCOUNTER — Other Ambulatory Visit: Payer: Self-pay

## 2017-04-06 DIAGNOSIS — K5732 Diverticulitis of large intestine without perforation or abscess without bleeding: Secondary | ICD-10-CM

## 2017-04-06 NOTE — Telephone Encounter (Signed)
Gastroenterology Pre-Procedure Review  Request Date: 04/09 Requesting Physician: Dr. Vicente Males  PATIENT REVIEW QUESTIONS: The patient responded to the following health history questions as indicated:    1. Are you having any GI issues?  Diverticulitis 2. Do you have a personal history of Polyps? no 3. Do you have a family history of Colon Cancer or Polyps? no 4. Diabetes Mellitus? no 5. Joint replacements in the past 12 months?no 6. Major health problems in the past 3 months?no 7. Any artificial heart valves, MVP, or defibrillator?no    MEDICATIONS & ALLERGIES:    Patient reports the following regarding taking any anticoagulation/antiplatelet therapy:   Plavix, Coumadin, Eliquis, Xarelto, Lovenox, Pradaxa, Brilinta, or Effient? no Aspirin? no  Patient confirms/reports the following medications:  Current Outpatient Medications  Medication Sig Dispense Refill  . ibuprofen (ADVIL,MOTRIN) 200 MG tablet Take 200 mg by mouth every 6 (six) hours as needed.    Marland Kitchen ibuprofen (ADVIL,MOTRIN) 800 MG tablet Take 1 tablet (800 mg total) by mouth every 8 (eight) hours as needed. 30 tablet 0   No current facility-administered medications for this visit.     Patient confirms/reports the following allergies:  Allergies  Allergen Reactions  . Tramadol Nausea And Vomiting    No orders of the defined types were placed in this encounter.   AUTHORIZATION INFORMATION Primary Insurance: 1D#: Group #:  Secondary Insurance: 1D#: Group #:  SCHEDULE INFORMATION: Date: 05/23/17 Time: Location:[facility]

## 2017-04-07 ENCOUNTER — Other Ambulatory Visit: Payer: Self-pay

## 2017-05-14 ENCOUNTER — Ambulatory Visit
Admission: EM | Admit: 2017-05-14 | Discharge: 2017-05-14 | Disposition: A | Discharge status: Home / Self Care | Payer: Self-pay | Attending: Emergency Medicine | Admitting: Emergency Medicine

## 2017-05-14 ENCOUNTER — Other Ambulatory Visit: Payer: Self-pay

## 2017-05-14 DIAGNOSIS — B349 Viral infection, unspecified: Secondary | ICD-10-CM

## 2017-05-14 DIAGNOSIS — R197 Diarrhea, unspecified: Secondary | ICD-10-CM

## 2017-05-14 DIAGNOSIS — M791 Myalgia, unspecified site: Secondary | ICD-10-CM

## 2017-05-14 DIAGNOSIS — Z0289 Encounter for other administrative examinations: Secondary | ICD-10-CM
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
## 2017-05-14 NOTE — Discharge Instructions (Signed)
Here is a list of primary care providers who are taking new patients: ° °Dr. Deanna Jones, Dr. Nichlos Plonk °[address] °[address] °[place] [address] °[phone] ° °[facility] [place] °[address] [place] [address]  °[place] [address]  °[phone] ° °[facility] °[address]  °[place], [place] [address] °[phone] ° °[facility] [place] °[address]  °[phone] °[place], [place] [address] ° °Here are clinics/ other resources who will see you if you do not have insurance. Some have certain criteria that you must meet. Call them and find out what they are: ° °[facility]: °[address] [place] [address] [place], [place] [address] °Phone: [phone] °Hours: First and Third Saturdays of each Month, 9 a.m. - 1 p.m. ° °[facility]: °[address], [place], [place] [address] °Phone: [phone] °Hours: °Tuesday, 4 p.m. - 8 p.m. °Thursday, 1 p.m. - 8 p.m. °Wednesday, 9 a.m. - Noon ° °[facility] °[address], [place], [place] [address] °Phone: [phone] °Pharmacy Phone Number: [phone] °Dental Phone Number: [phone] °ACA Insurance Help: [phone] ° °Dental Hours: °Monday - Thursday, 8 a.m. - 6 p.m. ° °[facility] °[address] [place], [place] [address] °Phone: [phone] °Pharmacy Phone Number: [phone] °ACA Insurance Help: [phone] ° °[facility] °[address] [place], [place] [address] °Phone: [phone] °Pharmacy Phone Number: [phone] °ACA Insurance Help: [phone] ° °[facility] °[address] [place], [place] [address] °Phone: [phone] °ACA Insurance Help: [phone]  ° °[facility] °[address] [place],  [address] °Phone: [phone] ° °Go to www.goodrx.com to look up your medications. This will give you a list of where you can find your prescriptions at the most affordable prices. Or ask the pharmacist what the cash price is,  or if they have any other discount programs available to help make your medication more affordable. This can be less expensive than what you would pay with insurance.   °

## 2017-05-14 NOTE — ED Triage Notes (Signed)
Patient states that last week he had flu- like symptoms. Patient states that he missed 3 days of work due to symptoms. Patient reports that symptoms have mostly resolved but is still having some body aches. Patient reports that he needs a note saying that he can return to work since symptoms have improved.

## 2017-05-22 ENCOUNTER — Other Ambulatory Visit: Payer: Self-pay

## 2017-05-22 ENCOUNTER — Telehealth: Payer: Self-pay

## 2017-05-22 MED ORDER — PEG 3350-KCL-NABCB-NACL-NASULF 236 G PO SOLR: 4000.0000 mL | Freq: Once | ORAL | 0 refills | Status: AC

## 2017-05-22 NOTE — Progress Notes (Signed)
  Patient stated that he did not receive colonoscopy instructions and bowel prep.  Asked patient if rx and instructions could be sent to pharmacy and he said yes to this.  Thanks [employer]

## 2017-05-22 NOTE — Telephone Encounter (Signed)
Patient stated that he did not receive his colonoscopy instructions.  I've asked his permission to fax his instuctions to the pharmacy along with his rx for Golytely.  He said that would be fine.  Thanks [employer]

## 2017-05-23 ENCOUNTER — Ambulatory Visit
Admission: RE | Admit: 2017-05-23 | Discharge: 2017-05-23 | Disposition: A | Discharge status: Home / Self Care | Payer: Self-pay | Source: Ambulatory Visit | Attending: Gastroenterology | Admitting: Gastroenterology

## 2017-05-23 ENCOUNTER — Ambulatory Visit: Payer: Self-pay | Admitting: Anesthesiology

## 2017-05-23 ENCOUNTER — Encounter: Payer: Self-pay | Admitting: Anesthesiology

## 2017-05-23 ENCOUNTER — Encounter
Admission: RE | Disposition: A | Discharge status: Home / Self Care | Payer: Self-pay | Source: Ambulatory Visit | Attending: Gastroenterology

## 2017-05-23 DIAGNOSIS — Z800 Family history of malignant neoplasm of digestive organs: Secondary | ICD-10-CM | POA: Insufficient documentation

## 2017-05-23 DIAGNOSIS — Z8379 Family history of other diseases of the digestive system: Secondary | ICD-10-CM | POA: Insufficient documentation

## 2017-05-23 DIAGNOSIS — F17210 Nicotine dependence, cigarettes, uncomplicated: Secondary | ICD-10-CM | POA: Insufficient documentation

## 2017-05-23 DIAGNOSIS — K5730 Diverticulosis of large intestine without perforation or abscess without bleeding: Secondary | ICD-10-CM | POA: Insufficient documentation

## 2017-05-23 DIAGNOSIS — K5732 Diverticulitis of large intestine without perforation or abscess without bleeding: Secondary | ICD-10-CM

## 2017-05-23 DIAGNOSIS — Z885 Allergy status to narcotic agent status: Secondary | ICD-10-CM | POA: Insufficient documentation

## 2017-05-23 HISTORY — PX: COLONOSCOPY WITH PROPOFOL: SHX5780

## 2017-05-23 SURGERY — COLONOSCOPY WITH PROPOFOL
Anesthesia: General

## 2017-05-23 MED ORDER — LIDOCAINE HCL (PF) 1 % IJ SOLN
INTRAMUSCULAR | Status: AC
  Filled 2017-05-23: qty 2

## 2017-05-23 MED ORDER — PROPOFOL 10 MG/ML IV BOLUS
INTRAVENOUS | Status: AC
  Filled 2017-05-23: qty 20

## 2017-05-23 MED ORDER — MIDAZOLAM HCL 2 MG/2ML IJ SOLN
INTRAMUSCULAR | Status: AC
  Filled 2017-05-23: qty 2

## 2017-05-23 MED ORDER — SODIUM CHLORIDE 0.9 % IV SOLN
INTRAVENOUS | Status: DC | PRN
  Administered 2017-05-23: 11:00:00 via INTRAVENOUS

## 2017-05-23 MED ORDER — LIDOCAINE HCL (CARDIAC) 20 MG/ML IV SOLN
INTRAVENOUS | Status: DC | PRN
  Administered 2017-05-23: 30 mg via INTRAVENOUS

## 2017-05-23 MED ORDER — PROPOFOL 500 MG/50ML IV EMUL
INTRAVENOUS | Status: DC | PRN
  Administered 2017-05-23: 150 ug/kg/min via INTRAVENOUS

## 2017-05-23 MED ORDER — PROPOFOL 10 MG/ML IV BOLUS
INTRAVENOUS | Status: DC | PRN
  Administered 2017-05-23: 30 mg via INTRAVENOUS
  Administered 2017-05-23 (×2): 20 mg via INTRAVENOUS
  Administered 2017-05-23 (×3): 30 mg via INTRAVENOUS
  Administered 2017-05-23: 40 mg via INTRAVENOUS
  Administered 2017-05-23: 20 mg via INTRAVENOUS

## 2017-05-23 MED ORDER — MIDAZOLAM HCL 2 MG/2ML IJ SOLN
INTRAMUSCULAR | Status: DC | PRN
  Administered 2017-05-23: 2 mg via INTRAVENOUS

## 2017-05-23 MED ORDER — PROPOFOL 500 MG/50ML IV EMUL
INTRAVENOUS | Status: AC
  Filled 2017-05-23: qty 50

## 2017-05-23 NOTE — Anesthesia Preprocedure Evaluation (Signed)
Anesthesia Evaluation  Patient identified by MRN, date of birth, ID band Patient awake    Reviewed: Allergy & Precautions, NPO status , Patient's Chart, lab work & pertinent test results, reviewed documented beta blocker date and time   Airway Mallampati: II  TM Distance: >3 FB     Dental  (+) Chipped   Pulmonary Current Smoker,           Cardiovascular      Neuro/Psych    GI/Hepatic   Endo/Other    Renal/GU      Musculoskeletal   Abdominal   Peds  Hematology   Anesthesia Other Findings   Reproductive/Obstetrics                             Anesthesia Physical Anesthesia Plan  ASA: II  Anesthesia Plan: General   Post-op Pain Management:    Induction: Intravenous  PONV Risk Score and Plan:   Airway Management Planned:   Additional Equipment:   Intra-op Plan:   Post-operative Plan:   Informed Consent: I have reviewed the patients History and Physical, chart, labs and discussed the procedure including the risks, benefits and alternatives for the proposed anesthesia with the patient or authorized representative who has indicated his/her understanding and acceptance.       Plan Discussed with: CRNA  Anesthesia Plan Comments:         Anesthesia Quick Evaluation  

## 2017-05-23 NOTE — OR Nursing (Signed)
IV dc'd site without redness or swelling tip intact.

## 2017-05-23 NOTE — Op Note (Signed)
[facility] Patient Name: Martin Garcia Procedure Date: 05/23/2017 10:36 AM MRN: [id number] Account #: [id number] Date of Birth: [birth year] Admit Type: Outpatient Age: 32 Room: [facility] ENDO ROOM 1 Gender: Male Note Status: Finalized Procedure:            Colonoscopy Indications:          Follow-up of diverticulitis Providers:            Jonathon Bellows MD, MD Referring MD:         No Local Md, MD (Referring MD) Medicines:            Monitored Anesthesia Care Complications:        No immediate complications. Procedure:            Pre-Anesthesia Assessment:                       - Prior to the procedure, a History and Physical was                        performed, and patient medications, allergies and                        sensitivities were reviewed. The patient's tolerance of                        previous anesthesia was reviewed.                       - The risks and benefits of the procedure and the                        sedation options and risks were discussed with the                        patient. All questions were answered and informed                        consent was obtained.                       - ASA Grade Assessment: II - A patient with mild                        systemic disease.                       After obtaining informed consent, the colonoscope was                        passed under direct vision. Throughout the procedure,                        the patient's blood pressure, pulse, and oxygen                        saturations were monitored continuously. The                        Colonoscope was introduced through the anus and  advanced to the the cecum, identified by the                        appendiceal orifice, IC valve and transillumination.                        The colonoscopy was performed with ease. The patient                        tolerated the procedure well. The quality of the bowel                        preparation was good. Findings:      Multiple small-mouthed diverticula were found in the sigmoid colon.      The exam was otherwise without abnormality on direct and retroflexion       views. Impression:           - Diverticulosis in the sigmoid colon.                       - The examination was otherwise normal on direct and                        retroflexion views.                       - No specimens collected. Recommendation:       - Discharge patient to home (with escort).                       - Resume previous diet.                       - Continue present medications. Procedure Code(s):    --- Professional ---                       [phone], Colonoscopy, flexible; diagnostic, including                        collection of specimen(s) by brushing or washing, when                        performed (separate procedure) Diagnosis Code(s):    --- Professional ---                       U04.54, Diverticulitis of large intestine without                        perforation or abscess without bleeding                       K57.30, Diverticulosis of large intestine without                        perforation or abscess without bleeding CPT copyright 2017 American Medical Association. All rights reserved. The codes documented in this report are preliminary and upon coder review may  be revised to meet current compliance requirements. Jonathon Bellows, MD Jonathon Bellows MD, MD 05/23/2017 10:56:14 AM This report has been signed electronically. Number of Addenda: 0 Note Initiated On: 05/23/2017 10:36 AM Scope Withdrawal Time: 0  hours 6 minutes 52 seconds  Total Procedure Duration: 0 hours 9 minutes 34 seconds       [facility]

## 2017-05-23 NOTE — Transfer of Care (Signed)
Immediate Anesthesia Transfer of Care Note  Patient: Martin Garcia  Procedure(s) Performed: COLONOSCOPY WITH PROPOFOL (N/A )  Patient Location: PACU  Anesthesia Type:MAC  Level of Consciousness: awake  Airway & Oxygen Therapy: Patient Spontanous Breathing  Post-op Assessment: Report given to RN  Post vital signs: stable  Last Vitals:  Vitals Value Taken Time  BP    Temp    Pulse    Resp    SpO2      Last Pain:  Vitals:   05/23/17 1017  TempSrc: Tympanic  PainSc: 0-No pain         Complications: No apparent anesthesia complications
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
## 2017-05-23 NOTE — Anesthesia Post-op Follow-up Note (Signed)
Anesthesia QCDR form completed.        

## 2017-05-23 NOTE — Anesthesia Postprocedure Evaluation (Signed)
Anesthesia Post Note  Patient: Martin Garcia  Procedure(s) Performed: COLONOSCOPY WITH PROPOFOL (N/A )  Patient location during evaluation: Endoscopy Anesthesia Type: General Level of consciousness: awake and alert Pain management: pain level controlled Vital Signs Assessment: post-procedure vital signs reviewed and stable Respiratory status: spontaneous breathing, nonlabored ventilation, respiratory function stable and patient connected to nasal cannula oxygen Cardiovascular status: blood pressure returned to baseline and stable Postop Assessment: no apparent nausea or vomiting Anesthetic complications: no     Last Vitals:  Vitals:   05/23/17 1059 05/23/17 1109  BP: 115/84 131/90  Pulse: 93 95  Resp: 18 18  Temp: 36.6 C   SpO2: 100% 100%    Last Pain:  Vitals:   05/23/17 1109  TempSrc:   PainSc: 0-No pain                 [name] S

## 2017-05-25 ENCOUNTER — Encounter: Payer: Self-pay | Admitting: Gastroenterology

## 2017-06-08 ENCOUNTER — Ambulatory Visit: Payer: Self-pay | Admitting: Surgery

## 2017-07-24 ENCOUNTER — Telehealth: Payer: Self-pay | Admitting: General Practice

## 2017-07-24 NOTE — Telephone Encounter (Signed)
Called Martin Garcia (patient's girlefriend) and left her a detailed message letting her know that [name] will need to schedule an appointment with [place] within this week or go to the ED if the patient is unbearable.   If patient or girlfriend calls, please schedule an appointment for diverticulitis. Thank you.

## 2017-07-24 NOTE — Telephone Encounter (Signed)
Patient's girlfriend is calling said the patient was complaining of being in pain, didn't not know the level of pain that he was at. Please call patient and advise.

## 2019-06-01 ENCOUNTER — Encounter: Payer: Self-pay | Admitting: Emergency Medicine

## 2019-06-01 ENCOUNTER — Emergency Department: Payer: Self-pay

## 2019-06-01 ENCOUNTER — Observation Stay
Admission: EM | Admit: 2019-06-01 | Discharge: 2019-06-02 | Disposition: A | Discharge status: Home / Self Care | Payer: Self-pay | Attending: Internal Medicine | Admitting: Internal Medicine

## 2019-06-01 ENCOUNTER — Other Ambulatory Visit: Payer: Self-pay

## 2019-06-01 DIAGNOSIS — I10 Essential (primary) hypertension: Secondary | ICD-10-CM | POA: Diagnosis present

## 2019-06-01 DIAGNOSIS — G8929 Other chronic pain: Secondary | ICD-10-CM | POA: Insufficient documentation

## 2019-06-01 DIAGNOSIS — R079 Chest pain, unspecified: Secondary | ICD-10-CM

## 2019-06-01 DIAGNOSIS — F17210 Nicotine dependence, cigarettes, uncomplicated: Secondary | ICD-10-CM | POA: Insufficient documentation

## 2019-06-01 DIAGNOSIS — Z20822 Contact with and (suspected) exposure to covid-19: Secondary | ICD-10-CM | POA: Insufficient documentation

## 2019-06-01 DIAGNOSIS — R030 Elevated blood-pressure reading, without diagnosis of hypertension: Secondary | ICD-10-CM | POA: Insufficient documentation

## 2019-06-01 DIAGNOSIS — I498 Other specified cardiac arrhythmias: Secondary | ICD-10-CM

## 2019-06-01 DIAGNOSIS — I209 Angina pectoris, unspecified: Secondary | ICD-10-CM

## 2019-06-01 DIAGNOSIS — Z79891 Long term (current) use of opiate analgesic: Secondary | ICD-10-CM | POA: Insufficient documentation

## 2019-06-01 DIAGNOSIS — E876 Hypokalemia: Secondary | ICD-10-CM | POA: Insufficient documentation

## 2019-06-01 DIAGNOSIS — R0789 Other chest pain: Principal | ICD-10-CM | POA: Insufficient documentation

## 2019-06-01 DIAGNOSIS — R008 Other abnormalities of heart beat: Secondary | ICD-10-CM | POA: Insufficient documentation

## 2019-06-01 LAB — GLUCOSE, CAPILLARY: Glucose-Capillary: 78 mg/dL (ref 70–99)

## 2019-06-01 LAB — CBC
HCT: 42.2 % (ref 39.0–52.0)
Hemoglobin: 14.4 g/dL (ref 13.0–17.0)
MCH: 29.8 pg (ref 26.0–34.0)
MCHC: 34.1 g/dL (ref 30.0–36.0)
MCV: 87.4 fL (ref 80.0–100.0)
Platelets: 379 10*3/uL (ref 150–400)
RBC: 4.83 MIL/uL (ref 4.22–5.81)
RDW: 12.5 % (ref 11.5–15.5)
WBC: 10.4 10*3/uL (ref 4.0–10.5)
nRBC: 0 % (ref 0.0–0.2)

## 2019-06-01 LAB — TROPONIN I (HIGH SENSITIVITY)
Troponin I (High Sensitivity): 2 ng/L (ref <18)
Troponin I (High Sensitivity): 7 ng/L (ref <18)

## 2019-06-01 LAB — BASIC METABOLIC PANEL
Anion gap: 13 (ref 5–15)
BUN: 7 mg/dL (ref 6–20)
CO2: 25 mmol/L (ref 22–32)
Calcium: 9.5 mg/dL (ref 8.9–10.3)
Chloride: 98 mmol/L (ref 98–111)
Creatinine, Ser: 0.87 mg/dL (ref 0.61–1.24)
GFR calc Af Amer: >60 mL/min (ref >60)
GFR calc non Af Amer: >60 mL/min (ref >60)
Glucose, Bld: 249 mg/dL — ABNORMAL HIGH (ref 70–99)
Potassium: 3.2 mmol/L — ABNORMAL LOW (ref 3.5–5.1)
Sodium: 136 mmol/L (ref 135–145)

## 2019-06-01 LAB — SARS CORONAVIRUS 2 (TAT 6-24 HRS): SARS Coronavirus 2: NEGATIVE

## 2019-06-01 LAB — MAGNESIUM: Magnesium: 2 mg/dL (ref 1.7–2.4)

## 2019-06-01 LAB — HIV ANTIBODY (ROUTINE TESTING W REFLEX): HIV Screen 4th Generation wRfx: NONREACTIVE

## 2019-06-01 LAB — TSH: TSH: 0.658 u[IU]/mL (ref 0.350–4.500)

## 2019-06-01 MED ORDER — SODIUM CHLORIDE 0.9 % IV BOLUS
1000.0000 mL | Freq: Once | INTRAVENOUS | Status: AC
  Administered 2019-06-01: 10:00:00 1000 mL via INTRAVENOUS

## 2019-06-01 MED ORDER — SODIUM CHLORIDE 0.9% FLUSH
3.0000 mL | Freq: Once | INTRAVENOUS | Status: AC
  Administered 2019-06-01: 3 mL via INTRAVENOUS

## 2019-06-01 MED ORDER — NICOTINE 14 MG/24HR TD PT24
14.0000 mg | MEDICATED_PATCH | Freq: Every day | TRANSDERMAL | Status: DC
  Administered 2019-06-01 – 2019-06-02 (×2): 14 mg via TRANSDERMAL
  Filled 2019-06-01 (×2): qty 1

## 2019-06-01 MED ORDER — PNEUMOCOCCAL VAC POLYVALENT 25 MCG/0.5ML IJ INJ: 0.5000 mL | INJECTION | INTRAMUSCULAR | Status: DC

## 2019-06-01 MED ORDER — LORAZEPAM 2 MG/ML IJ SOLN
1.0000 mg | INTRAMUSCULAR | Status: DC | PRN
  Administered 2019-06-02: 1 mg via INTRAVENOUS
  Administered 2019-06-02: 07:00:00 2 mg via INTRAVENOUS
  Filled 2019-06-01 (×2): qty 1

## 2019-06-01 MED ORDER — DOXEPIN HCL 25 MG PO CAPS
25.0000 mg | ORAL_CAPSULE | Freq: Every day | ORAL | Status: DC
  Administered 2019-06-01: 25 mg via ORAL
  Filled 2019-06-01 (×2): qty 1

## 2019-06-01 MED ORDER — METOPROLOL TARTRATE 25 MG PO TABS
25.0000 mg | ORAL_TABLET | Freq: Once | ORAL | Status: AC
  Administered 2019-06-01: 25 mg via ORAL
  Filled 2019-06-01: qty 1

## 2019-06-01 MED ORDER — ADULT MULTIVITAMIN W/MINERALS CH
1.0000 | ORAL_TABLET | Freq: Every day | ORAL | Status: DC
  Administered 2019-06-01 – 2019-06-02 (×2): 1 via ORAL
  Filled 2019-06-01 (×2): qty 1

## 2019-06-01 MED ORDER — ATORVASTATIN CALCIUM 20 MG PO TABS
20.0000 mg | ORAL_TABLET | Freq: Every day | ORAL | Status: DC
  Administered 2019-06-01: 20 mg via ORAL
  Filled 2019-06-01: qty 1

## 2019-06-01 MED ORDER — LORAZEPAM 1 MG PO TABS: 1.0000 mg | ORAL_TABLET | ORAL | Status: DC | PRN

## 2019-06-01 MED ORDER — AMLODIPINE BESYLATE 5 MG PO TABS
5.0000 mg | ORAL_TABLET | Freq: Every day | ORAL | Status: DC
  Administered 2019-06-01 – 2019-06-02 (×2): 5 mg via ORAL
  Filled 2019-06-01 (×2): qty 1

## 2019-06-01 MED ORDER — FOLIC ACID 1 MG PO TABS
1.0000 mg | ORAL_TABLET | Freq: Every day | ORAL | Status: DC
  Administered 2019-06-01 – 2019-06-02 (×2): 1 mg via ORAL
  Filled 2019-06-01 (×2): qty 1

## 2019-06-01 MED ORDER — MAGNESIUM SULFATE 2 GM/50ML IV SOLN
2.0000 g | Freq: Once | INTRAVENOUS | Status: AC
  Administered 2019-06-01: 09:00:00 2 g via INTRAVENOUS
  Filled 2019-06-01: qty 50

## 2019-06-01 MED ORDER — ENOXAPARIN SODIUM 40 MG/0.4ML ~~LOC~~ SOLN
40.0000 mg | SUBCUTANEOUS | Status: DC
  Administered 2019-06-01: 21:00:00 40 mg via SUBCUTANEOUS
  Filled 2019-06-01: qty 0.4

## 2019-06-01 MED ORDER — METOPROLOL TARTRATE 25 MG PO TABS
12.5000 mg | ORAL_TABLET | Freq: Two times a day (BID) | ORAL | Status: DC
  Administered 2019-06-01 – 2019-06-02 (×2): 12.5 mg via ORAL
  Filled 2019-06-01 (×2): qty 1

## 2019-06-01 MED ORDER — THIAMINE HCL 100 MG PO TABS
100.0000 mg | ORAL_TABLET | Freq: Every day | ORAL | Status: DC
  Administered 2019-06-02: 100 mg via ORAL
  Filled 2019-06-01: qty 1

## 2019-06-01 MED ORDER — METOPROLOL TARTRATE 25 MG PO TABS: 25.0000 mg | ORAL_TABLET | Freq: Two times a day (BID) | ORAL | Status: DC

## 2019-06-01 MED ORDER — ASPIRIN EC 81 MG PO TBEC
81.0000 mg | DELAYED_RELEASE_TABLET | Freq: Every day | ORAL | Status: DC
  Administered 2019-06-01 – 2019-06-02 (×2): 81 mg via ORAL
  Filled 2019-06-01 (×2): qty 1

## 2019-06-01 MED ORDER — THIAMINE HCL 100 MG/ML IJ SOLN
100.0000 mg | Freq: Every day | INTRAMUSCULAR | Status: DC
  Administered 2019-06-01: 100 mg via INTRAVENOUS
  Filled 2019-06-01: qty 2

## 2019-06-01 MED ORDER — POTASSIUM CHLORIDE CRYS ER 20 MEQ PO TBCR
40.0000 meq | EXTENDED_RELEASE_TABLET | Freq: Once | ORAL | Status: AC
  Administered 2019-06-01: 40 meq via ORAL
  Filled 2019-06-01: qty 2

## 2019-06-01 MED ORDER — ACETAMINOPHEN 325 MG PO TABS: 650.0000 mg | ORAL_TABLET | ORAL | Status: DC | PRN

## 2019-06-01 MED ORDER — ONDANSETRON HCL 4 MG/2ML IJ SOLN: 4.0000 mg | Freq: Four times a day (QID) | INTRAMUSCULAR | Status: DC | PRN

## 2019-06-01 NOTE — ED Notes (Signed)
Dr. Paduchowski at bedside.  

## 2019-06-01 NOTE — Progress Notes (Signed)
Patient transferred to rm 231. Patient has no complaints of chest pain, VSS. Patient educated on safety, call bell within reach. Patient stated he goes to a methadone clinic and is concerned about not receiving that medication in the morning. He also states he drinks about 6 beers a day and the last drink he had was last night. MD made aware and will be put on CIWA.
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
## 2019-06-01 NOTE — ED Triage Notes (Signed)
Pt to ED via POV c/o chest pain x 1 week. Pt states that pain has been intermittent over the past week. Pt denies drug use, states that he has been under increased stress recently. Pt states that he is not having pain currently but he can tell his heart is not beating normally. Pt states that he has had shortness of breath but is not currently short of breath. Pt is in NAD.
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
## 2019-06-01 NOTE — ED Notes (Signed)
Pt transported otf for imaging  

## 2019-06-02 ENCOUNTER — Observation Stay
Admit: 2019-06-02 | Discharge: 2019-06-02 | Disposition: A | Discharge status: Home / Self Care | Payer: Self-pay | Attending: Cardiovascular Disease | Admitting: Cardiovascular Disease

## 2019-06-02 DIAGNOSIS — I10 Essential (primary) hypertension: Secondary | ICD-10-CM

## 2019-06-02 DIAGNOSIS — E876 Hypokalemia: Secondary | ICD-10-CM

## 2019-06-02 DIAGNOSIS — I498 Other specified cardiac arrhythmias: Secondary | ICD-10-CM

## 2019-06-02 DIAGNOSIS — R0789 Other chest pain: Secondary | ICD-10-CM | POA: Diagnosis present

## 2019-06-02 LAB — LIPID PANEL
Cholesterol: 170 mg/dL (ref 0–200)
HDL: 45 mg/dL (ref >40)
LDL Cholesterol: 81 mg/dL (ref 0–99)
Total CHOL/HDL Ratio: 3.8 RATIO
Triglycerides: 222 mg/dL — ABNORMAL HIGH (ref <150)
VLDL: 44 mg/dL — ABNORMAL HIGH (ref 0–40)

## 2019-06-02 MED ORDER — METHADONE HCL 10 MG/ML PO CONC
180.0000 mg | Freq: Every day | ORAL | Status: DC
  Administered 2019-06-02: 180 mg via ORAL
  Filled 2019-06-02: qty 18

## 2019-06-02 MED ORDER — PANTOPRAZOLE SODIUM 40 MG PO TBEC: 40.0000 mg | DELAYED_RELEASE_TABLET | Freq: Every day | ORAL | 0 refills | Status: DC

## 2019-06-02 NOTE — Discharge Summary (Signed)
Physician Discharge Summary  Martin Garcia [id number] DOB: 09-14-1985 DOA: 06/01/2019  PCP: Patient, No Pcp Per  Admit date: 06/01/2019 Discharge date: 06/02/2019  Admitted From: Home Disposition: Home  Recommendations for Outpatient Follow-up:  Follow-up with cardiologist Dr. Humphrey Rolls tomorrow (4/19) at 10 AM Patient instructed to establish care with a PCP in the community  Home Health: None Equipment/Devices: None  Discharge Condition: Fair CODE STATUS: Full code Diet recommendation: Regular    Discharge Diagnoses:  Principal Problem:   Atypical chest pain  Active Problems:   Hypokalemia   Essential hypertension   Bigeminy Tobacco use Alcohol use  Brief narrative/HPI 34 year old male with history of diverticulitis s/p surgery, ongoing tobacco use presented to the ED with substernal chest pain, intermittent for past 1 week without radiation.  Associated palpitation and some shortness of breath.  No nausea or vomiting.  He reported having frequent alcohol use. In the ED vitals were stable except for elevated blood pressure.  EKG showed bigeminy with some ST depression.  Serial troponin was negative.  Cardiology consulted and placed on observation.  Hospital course Chest pain, atypical Suspect gastritis with GERD versus heavy alcohol and tobacco use.  Reports that he has been under a lot of stress recently as well. Symptoms have improved.  Serial troponin negative and stable on telemetry.  Seen by cardiology and recommends outpatient follow-up tomorrow in the office.  2D echo has been done and which can be followed as outpatient. Prescribed 14-day course of oral Protonix for suspected GERD. Patient counseled on tobacco and alcohol cessation.  Prescribe nicotine patch.  Plans to quit.  Elevated blood pressure No history of hypertension.  Current pain symptoms also likely contributing to his elevated blood pressure.  I have told him that he needs to establish care with a  primary care and have his blood pressure monitored and started on antihypertensive if still elevated.  Chronic pain On methadone daily which is continued.  Patient stable to be discharged home with outpatient follow-up.  Procedure: 2D echo Consult: Cardiology (Dr. Humphrey Rolls) Family communication: None  Disposition: Home  Discharge Instructions   Allergies as of 06/02/2019      Reactions   Tramadol Nausea And Vomiting      Medication List    TAKE these medications   methadone 10 MG/ML solution Commonly known as: DOLOPHINE Take 180 mg by mouth daily.   Multivitamin Men Tabs Take 1 tablet by mouth daily.   pantoprazole 40 MG tablet Commonly known as: Protonix Take 1 tablet (40 mg total) by mouth daily.      Follow-up Information    Dionisio David, MD Follow up on 06/03/2019.   Specialty: Cardiology Why: at 10 am Contact information: [place] [address] [phone]          Allergies  Allergen Reactions  . Tramadol Nausea And Vomiting      Procedures/Studies: DG Chest 2 View  Result Date: 06/01/2019 CLINICAL DATA:  Chest pain for a week. EXAM: CHEST - 2 VIEW COMPARISON:  None. FINDINGS: Cardiomediastinal silhouette is within normal limits in size and configuration. Lungs are clear. Lung volumes are normal. No evidence of pneumonia. No pleural effusion. No pneumothorax seen. Mild degenerative spondylosis within the midthoracic spine, with associated mild chronic appearing wedging of a midthoracic vertebral body which is grossly stable compared to previous CT abdomen exams. Osseous structures about the chest are otherwise unremarkable. IMPRESSION: No acute findings.  No evidence of pneumonia or pulmonary edema. Electronically Signed  By: Franki Cabot M.D.   On: 06/01/2019 09:50       Subjective: Stable on telemetry.  Reports chest discomfort to have improved    Discharge Exam: Vitals:   06/02/19 0712 06/02/19 0844  BP: (!) 125/94 (!)  155/98  Pulse: 66 69  Resp: 17   Temp: 99.3 F (37.4 C)   SpO2: 99%    Vitals:   06/02/19 0400 06/02/19 0653 06/02/19 0712 06/02/19 0844  BP:  (!) 133/104 (!) 125/94 (!) 155/98  Pulse:  69 66 69  Resp: (!) 23  17   Temp:   99.3 F (37.4 C)   TempSrc:   Oral   SpO2:   99%   Weight:      Height:        General: Middle-aged male not in distress HEENT: Moist mucous, supple neck Chest: Clear bilaterally CVs: Normal S1-S2, no murmurs GI: Soft, nondistended, nontender Musculoskeletal: Warm, no edema   The results of significant diagnostics from this hospitalization (including imaging, microbiology, ancillary and laboratory) are listed below for reference.     Microbiology: Recent Results (from the past 240 hour(s))  SARS CORONAVIRUS 2 (TAT 6-24 HRS) Nasopharyngeal Nasopharyngeal Swab     Status: None   Collection Time: 06/01/19 11:23 AM   Specimen: Nasopharyngeal Swab  Result Value Ref Range Status   SARS Coronavirus 2 NEGATIVE NEGATIVE Final    Comment: (NOTE) SARS-CoV-2 target nucleic acids are NOT DETECTED. The SARS-CoV-2 RNA is generally detectable in upper and lower respiratory specimens during the acute phase of infection. Negative results do not preclude SARS-CoV-2 infection, do not rule out co-infections with other pathogens, and should not be used as the sole basis for treatment or other patient management decisions. Negative results must be combined with clinical observations, patient history, and epidemiological information. The expected result is Negative. Fact Sheet for Patients: SugarRoll.be Fact Sheet for Healthcare Providers: https://www.woods-mathews.com/ This test is not yet approved or cleared by the [place] FDA and  has been authorized for detection and/or diagnosis of SARS-CoV-2 by FDA under an Emergency Use Authorization (EUA). This EUA will remain  in effect (meaning this test can be used) for the  duration of the COVID-19 declaration under Section 56 4(b)(1) of the Act, 21 U.S.C. section 360bbb-3(b)(1), unless the authorization is terminated or revoked sooner. Performed at [facility], [place] [address], [place],  [address]      Labs: BNP (last 3 results) No results for input(s): BNP in the last 8760 hours. Basic Metabolic Panel: Recent Labs  Lab 06/01/19 0859  NA 136  K 3.2*  CL 98  CO2 25  GLUCOSE 249*  BUN 7  CREATININE 0.87  CALCIUM 9.5  MG 2.0   Liver Function Tests: No results for input(s): AST, ALT, ALKPHOS, BILITOT, PROT, ALBUMIN in the last 168 hours. No results for input(s): LIPASE, AMYLASE in the last 168 hours. No results for input(s): AMMONIA in the last 168 hours. CBC: Recent Labs  Lab 06/01/19 0859  WBC 10.4  HGB 14.4  HCT 42.2  MCV 87.4  PLT 379   Cardiac Enzymes: No results for input(s): CKTOTAL, CKMB, CKMBINDEX, TROPONINI in the last 168 hours. BNP: Invalid input(s): POCBNP CBG: Recent Labs  Lab 06/01/19 1130  GLUCAP 78   D-Dimer No results for input(s): DDIMER in the last 72 hours. Hgb A1c No results for input(s): HGBA1C in the last 72 hours. Lipid Profile Recent Labs    06/02/19 0457  CHOL 170  HDL 45  LDLCALC 81  TRIG 222*  CHOLHDL 3.8   Thyroid function studies Recent Labs    06/01/19 0859  TSH 0.658   Anemia work up No results for input(s): VITAMINB12, FOLATE, FERRITIN, TIBC, IRON, RETICCTPCT in the last 72 hours. Urinalysis    Component Value Date/Time   COLORURINE YELLOW (A) 02/18/2017 1725   APPEARANCEUR CLEAR (A) 02/18/2017 1725   APPEARANCEUR Clear 06/13/2014 1837   LABSPEC 1.021 02/18/2017 1725   LABSPEC 1.006 06/13/2014 1837   PHURINE 5.0 02/18/2017 1725   GLUCOSEU NEGATIVE 02/18/2017 1725   GLUCOSEU 150 mg/dL 06/13/2014 1837   HGBUR NEGATIVE 02/18/2017 1725   BILIRUBINUR NEGATIVE 02/18/2017 1725   BILIRUBINUR Negative 06/13/2014 1837   KETONESUR 20 (A) 02/18/2017 1725   PROTEINUR 30  (A) 02/18/2017 1725   NITRITE NEGATIVE 02/18/2017 1725   LEUKOCYTESUR TRACE (A) 02/18/2017 1725   LEUKOCYTESUR Negative 06/13/2014 1837   Sepsis Labs Invalid input(s): PROCALCITONIN,  WBC,  LACTICIDVEN Microbiology Recent Results (from the past 240 hour(s))  SARS CORONAVIRUS 2 (TAT 6-24 HRS) Nasopharyngeal Nasopharyngeal Swab     Status: None   Collection Time: 06/01/19 11:23 AM   Specimen: Nasopharyngeal Swab  Result Value Ref Range Status   SARS Coronavirus 2 NEGATIVE NEGATIVE Final    Comment: (NOTE) SARS-CoV-2 target nucleic acids are NOT DETECTED. The SARS-CoV-2 RNA is generally detectable in upper and lower respiratory specimens during the acute phase of infection. Negative results do not preclude SARS-CoV-2 infection, do not rule out co-infections with other pathogens, and should not be used as the sole basis for treatment or other patient management decisions. Negative results must be combined with clinical observations, patient history, and epidemiological information. The expected result is Negative. Fact Sheet for Patients: SugarRoll.be Fact Sheet for Healthcare Providers: https://www.woods-mathews.com/ This test is not yet approved or cleared by the [place] FDA and  has been authorized for detection and/or diagnosis of SARS-CoV-2 by FDA under an Emergency Use Authorization (EUA). This EUA will remain  in effect (meaning this test can be used) for the duration of the COVID-19 declaration under Section 56 4(b)(1) of the Act, 21 U.S.C. section 360bbb-3(b)(1), unless the authorization is terminated or revoked sooner. Performed at [facility], [place] [address], [place], [place] [address]      Time coordinating discharge: 25 minutes  SIGNED:   Louellen Molder, MD  [facility] 06/02/2019, 9:46 AM Pager   If 7PM-7AM, please contact night-coverage [email or web address] Password [email or web address]

## 2019-06-02 NOTE — Progress Notes (Signed)
Patient educated on discharge instructions and verbalized understanding. Patient knows to follow up with cardiologist tomorrow morning. Patient will drive himself home.

## 2019-06-02 NOTE — Progress Notes (Signed)
SUBJECTIVE: Patient still has intermittent chest pain   Vitals:   06/02/19 0400 06/02/19 0653 06/02/19 0712 06/02/19 0844  BP:  (!) 133/104 (!) 125/94 (!) 155/98  Pulse:  69 66 69  Resp: (!) 23  17   Temp:   99.3 F (37.4 C)   TempSrc:   Oral   SpO2:   99%   Weight:      Height:        Intake/Output Summary (Last 24 hours) at 06/02/2019 0927 Last data filed at 06/02/2019 0355 Gross per 24 hour  Intake 960 ml  Output 575 ml  Net 385 ml    LABS: Basic Metabolic Panel: Recent Labs    06/01/19 0859  NA 136  K 3.2*  CL 98  CO2 25  GLUCOSE 249*  BUN 7  CREATININE 0.87  CALCIUM 9.5  MG 2.0   Liver Function Tests: No results for input(s): AST, ALT, ALKPHOS, BILITOT, PROT, ALBUMIN in the last 72 hours. No results for input(s): LIPASE, AMYLASE in the last 72 hours. CBC: Recent Labs    06/01/19 0859  WBC 10.4  HGB 14.4  HCT 42.2  MCV 87.4  PLT 379   Cardiac Enzymes: No results for input(s): CKTOTAL, CKMB, CKMBINDEX, TROPONINI in the last 72 hours. BNP: Invalid input(s): POCBNP D-Dimer: No results for input(s): DDIMER in the last 72 hours. Hemoglobin A1C: No results for input(s): HGBA1C in the last 72 hours. Fasting Lipid Panel: Recent Labs    06/02/19 0457  CHOL 170  HDL 45  LDLCALC 81  TRIG 222*  CHOLHDL 3.8   Thyroid Function Tests: Recent Labs    06/01/19 0859  TSH 0.658   Anemia Panel: No results for input(s): VITAMINB12, FOLATE, FERRITIN, TIBC, IRON, RETICCTPCT in the last 72 hours.   PHYSICAL EXAM General: Well developed, well nourished, in no acute distress HEENT:  Normocephalic and atramatic Neck:  No JVD.  Lungs: Clear bilaterally to auscultation and percussion. Heart: HRRR . Normal S1 and S2 without gallops or murmurs.  Abdomen: Bowel sounds are positive, abdomen soft and non-tender  Msk:  Back normal, normal gait. Normal strength and tone for age. Extremities: No clubbing, cyanosis or edema.   Neuro: Alert and oriented X  3. Psych:  Good affect, responds appropriately  TELEMETRY: Sinus rhythm  ASSESSMENT AND PLAN: Atypical chest pain ruled out for myocardial infarction received magnesium and metoprolol with significant improvement.  Advise also treating acid reflux as the cause of chest pain advised Protonix 40 p.o. twice daily.  Patient ruled out for myocardial infarction and can be discharged with follow-up 10 AM tomorrow in my office.  Active Problems:   Ischemic chest pain ([place])   Hypokalemia   Essential hypertension    Dasia Guerrier A, MD, [facility] 06/02/2019 9:27 AM

## 2019-06-03 LAB — ECHOCARDIOGRAM COMPLETE
Height: 72 in
Weight: 2318.4 oz

## 2019-07-27 IMAGING — CT CT ABD-PELV W/ CM
2 of 4 series · 15 of 46 positions shown, 17 images · IV contrast (APPLIED)
Comparison: None.

CLINICAL DATA: Patient with history of colonic abscess status post
surgical repair in 0822. Abdominal pain. Right-sided groin pain.

EXAM:
CT ABDOMEN AND PELVIS WITH CONTRAST
TECHNIQUE: Multidetector CT imaging of the abdomen and pelvis was performed
using the standard protocol following bolus administration of
intravenous contrast.
CONTRAST:  100mL [id number] IOPAMIDOL (2FN17R-700) INJECTION 61%

[Series 2: routine abd/pel with · axial · 0.73mm/px · z∈[-917,-447]mm · 12 of 108 slices shown, 14 images]
[im 9/108  soft-tissue]
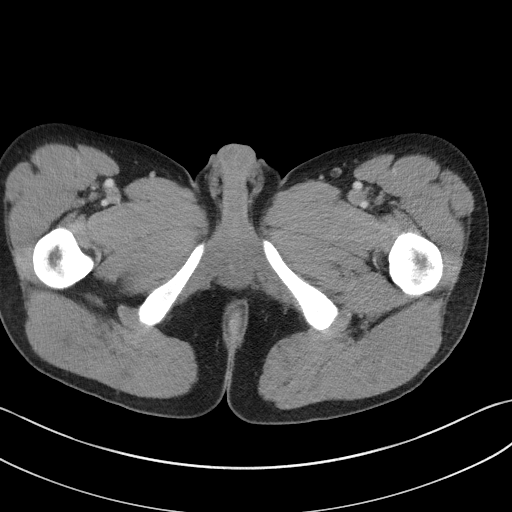
[im 9/108  bone]
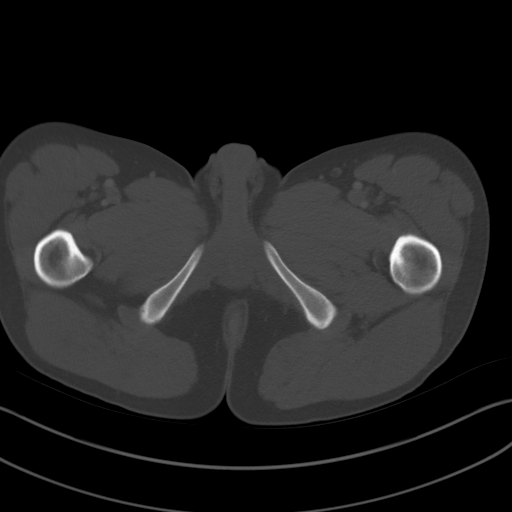
[im 18/108  soft-tissue]
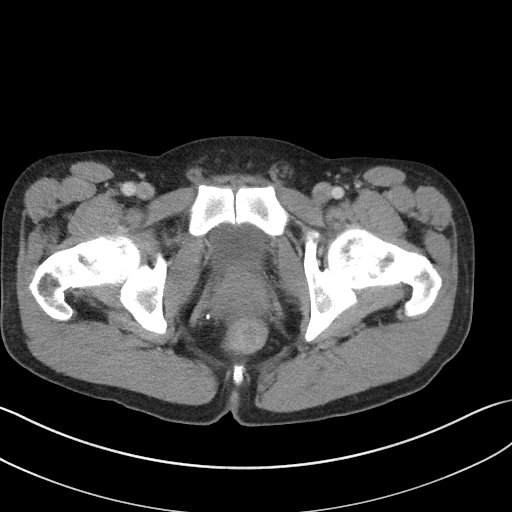
[im 26/108  soft-tissue]
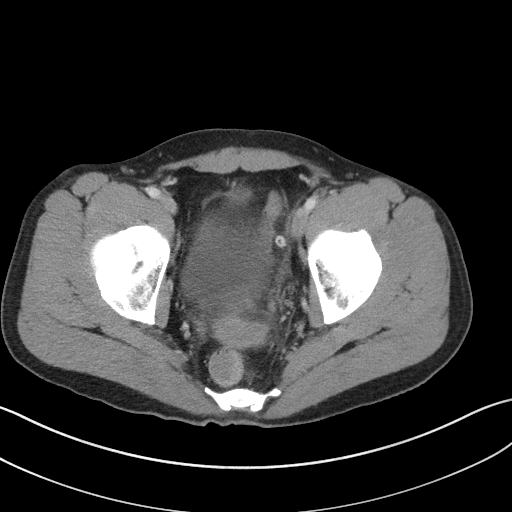
[im 35/108  soft-tissue]
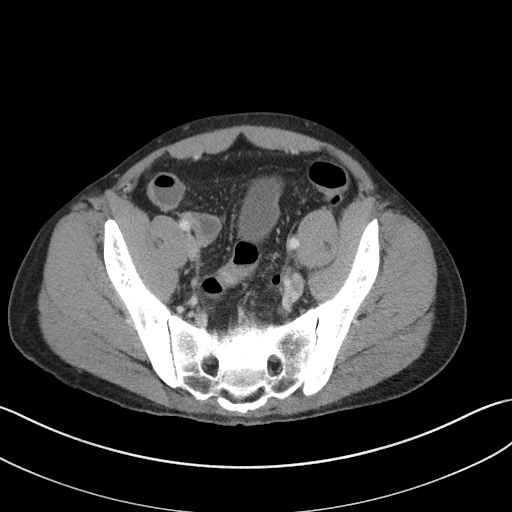
[im 43/108  soft-tissue]
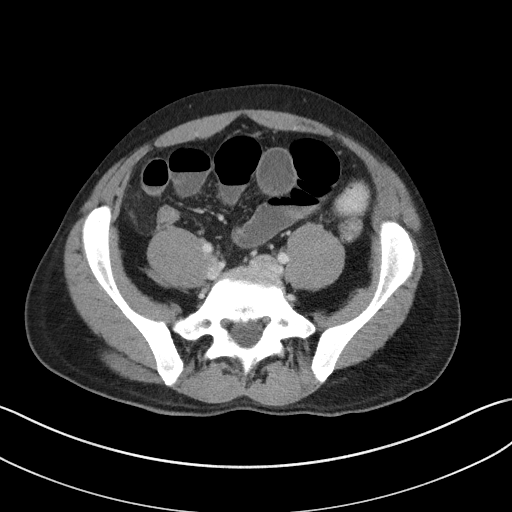
[im 52/108  soft-tissue]
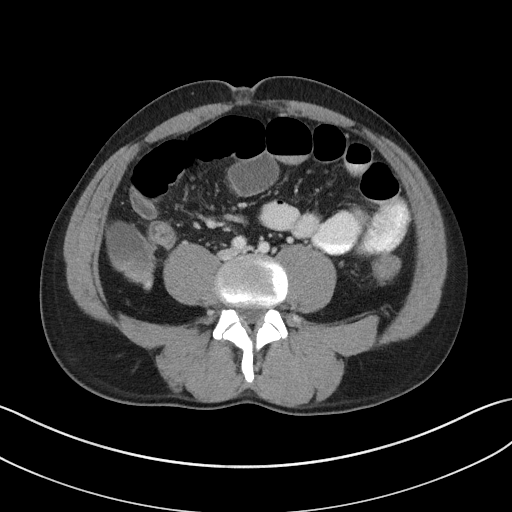
[im 60/108  soft-tissue]
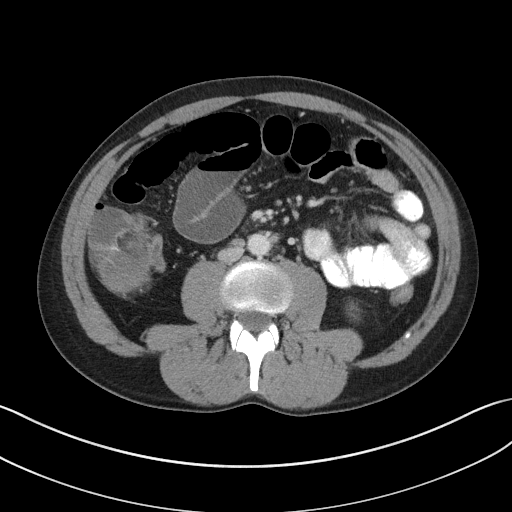
[im 69/108  soft-tissue]
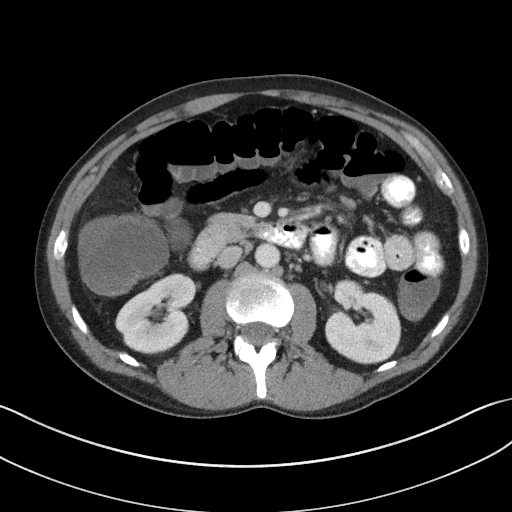
[im 78/108  soft-tissue]
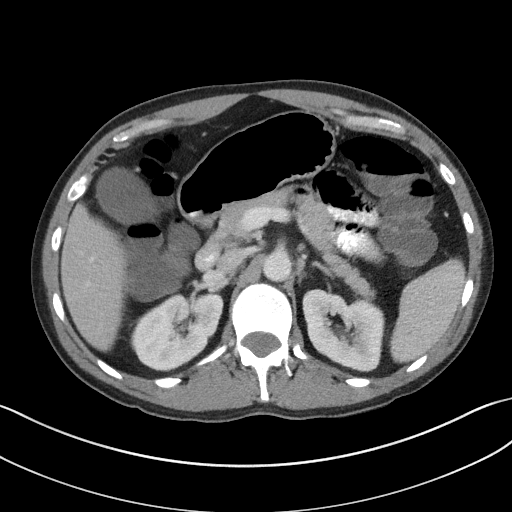
[im 78/108  bone]
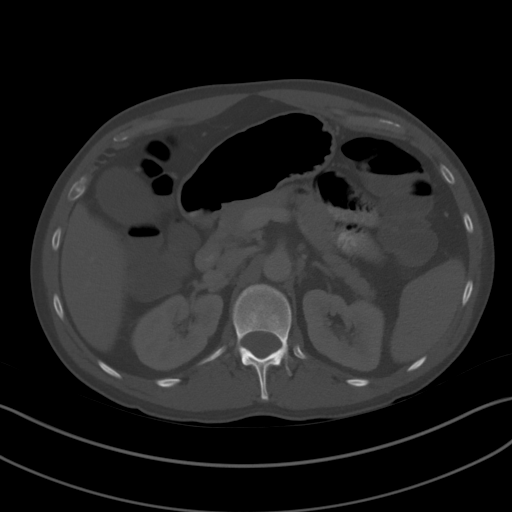
[im 86/108  soft-tissue]
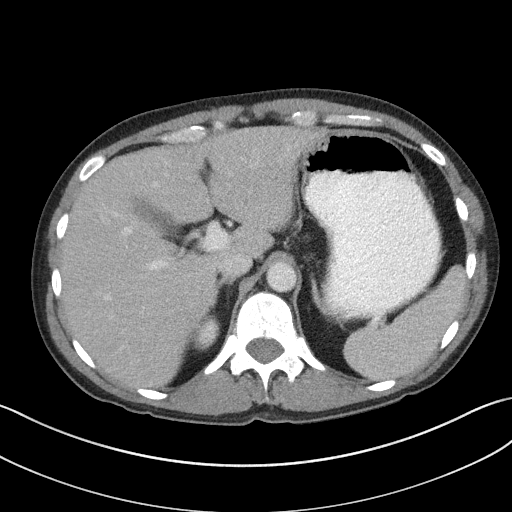
[im 95/108  soft-tissue]
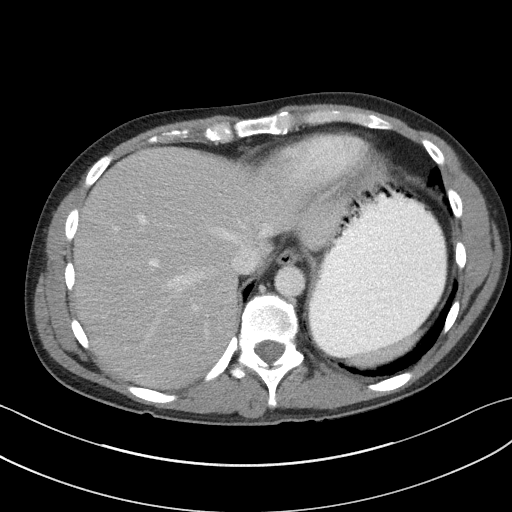
[im 103/108  soft-tissue]
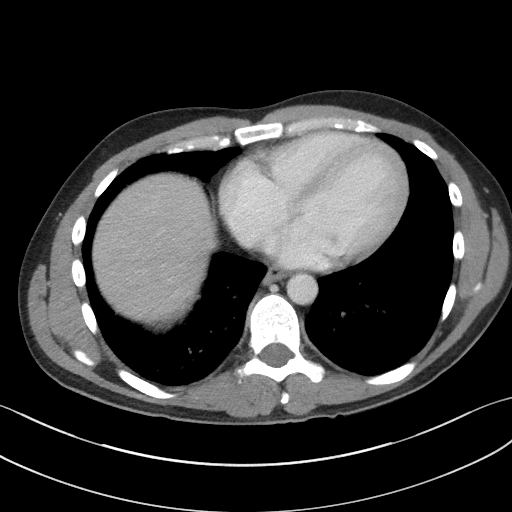

[Series 5: coronal st · coronal · 0.73mm/px · 3 of 88 slices shown]
[im 30/88  soft-tissue]
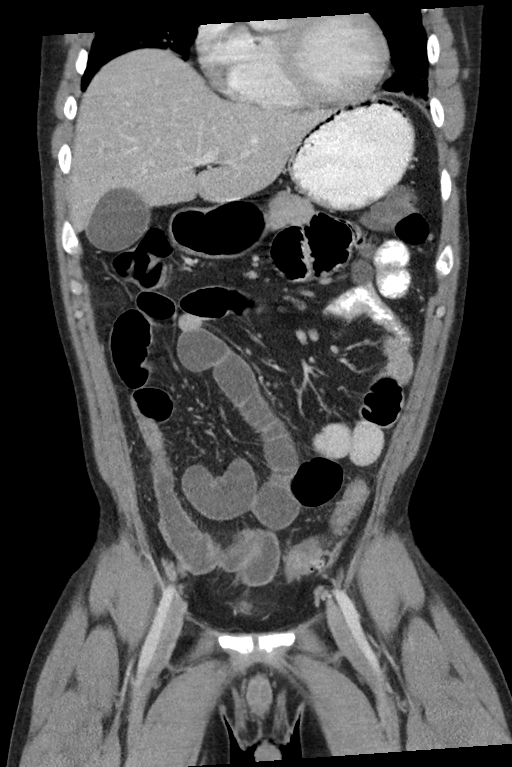
[im 39/88  soft-tissue]
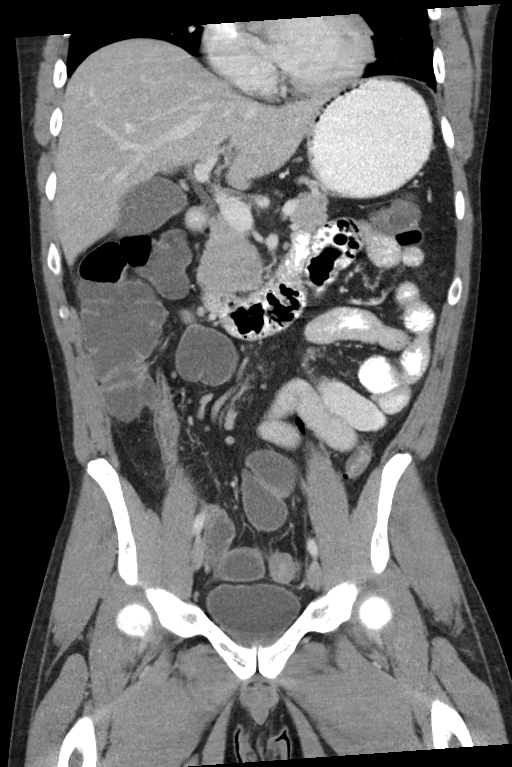
[im 49/88  soft-tissue]
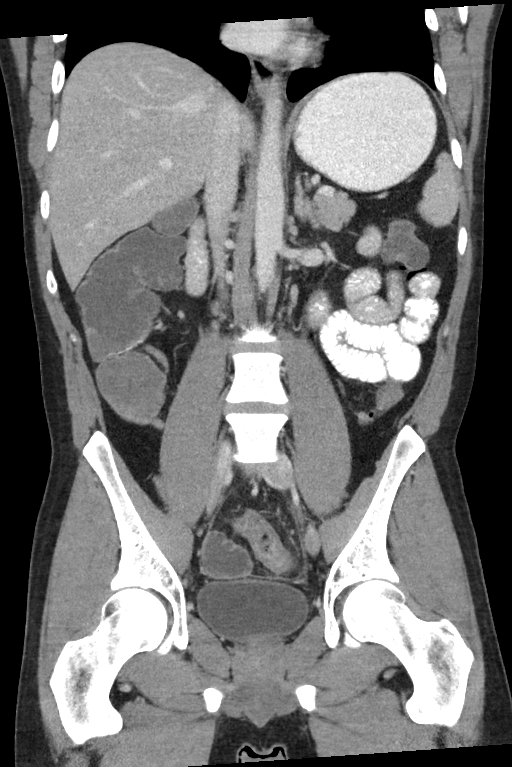

[15 of 46 positions shown; findings below may reference images not displayed]

FINDINGS: Lower chest: Normal heart size. Dependent atelectasis within the
bilateral lower lobes. No pleural effusion.

Hepatobiliary: Liver is normal in size and contour. Gallbladder is
unremarkable. No intrahepatic or extrahepatic biliary ductal
dilatation.

Pancreas: Unremarkable

Spleen: Unremarkable

Adrenals/Urinary Tract: Normal adrenal glands. Kidneys enhance
symmetrically with contrast. No hydronephrosis. Urinary bladder is
unremarkable.

Stomach/Bowel: There is wall thickening of the sigmoid colon with
pericolonic fat stranding and small amount of fluid in the pelvis.
Descending and sigmoid colonic diverticulosis. Multiple dilated
fluid-filled loops of small bowel are demonstrated within the
central abdomen tapering to more decompressed small bowel distally.
No free intraperitoneal air. Normal morphology of the stomach.
Subcentimeter retroperitoneal lymph nodes. Normal appendix.

Vascular/Lymphatic: Normal caliber abdominal aorta. Prominent
subcentimeter retroperitoneal lymph nodes.

Reproductive: Prostate unremarkable.

Other: None.

Musculoskeletal: No aggressive or acute appearing osseous lesions.
Focal sclerotic lesion within the proximal right femur (image 97;
series 2, favored to represent a bone island.
IMPRESSION: 1. There is wall thickening and inflammatory change about the distal
colon most compatible with acute diverticulitis.
2. Multiple fluid-filled dilated loops of small bowel throughout the
abdomen, likely reactive ileus from colonic process.

## 2020-09-22 ENCOUNTER — Encounter: Payer: Self-pay | Admitting: Nurse Practitioner

## 2020-09-22 ENCOUNTER — Ambulatory Visit: Payer: Medicaid Other | Admitting: Nurse Practitioner

## 2020-09-22 ENCOUNTER — Other Ambulatory Visit: Payer: Self-pay

## 2020-09-22 VITALS — BP 154/100 | HR 101 | Temp 99.2°F | Ht 70.87 in | Wt 185.4 lb

## 2020-09-22 DIAGNOSIS — R140 Abdominal distension (gaseous): Secondary | ICD-10-CM

## 2020-09-22 DIAGNOSIS — F1010 Alcohol abuse, uncomplicated: Secondary | ICD-10-CM | POA: Diagnosis not present

## 2020-09-22 DIAGNOSIS — Z7689 Persons encountering health services in other specified circumstances: Secondary | ICD-10-CM

## 2020-09-22 DIAGNOSIS — F1190 Opioid use, unspecified, uncomplicated: Secondary | ICD-10-CM

## 2020-09-22 DIAGNOSIS — R1030 Lower abdominal pain, unspecified: Secondary | ICD-10-CM | POA: Diagnosis not present

## 2020-09-22 DIAGNOSIS — R7989 Other specified abnormal findings of blood chemistry: Secondary | ICD-10-CM

## 2020-09-22 DIAGNOSIS — I10 Essential (primary) hypertension: Secondary | ICD-10-CM

## 2020-09-22 LAB — URINALYSIS, ROUTINE W REFLEX MICROSCOPIC
Bilirubin, UA: NEGATIVE
Glucose, UA: NEGATIVE
Ketones, UA: NEGATIVE
Leukocytes,UA: NEGATIVE
Nitrite, UA: NEGATIVE
Protein,UA: NEGATIVE
RBC, UA: NEGATIVE
Specific Gravity, UA: <1.005 — ABNORMAL LOW (ref 1.005–1.030)
Urobilinogen, Ur: 0.2 mg/dL (ref 0.2–1.0)
pH, UA: 5.5 (ref 5.0–7.5)

## 2020-09-22 MED ORDER — LISINOPRIL 20 MG PO TABS: 20.0000 mg | ORAL_TABLET | Freq: Every day | ORAL | 1 refills | Status: DC

## 2020-09-22 NOTE — Assessment & Plan Note (Signed)
Follows with methadone clinic. Keep appointments with them and follow up per their recommendations.
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
## 2020-09-22 NOTE — Assessment & Plan Note (Signed)
Chronic, ongoing. Was placed on lisinopril '10mg'$  daily by the ER physician in April 2022. He took 1 month of the medication and then didn't get any refills as he had not yet established with a PCP. He states that while he was on the medication, he was checking his blood pressure at home and it was 150s/90s. BP today 154/100. Will re-start lisinopril. Check CMP, CBC today. Follow-up in 4 weeks.

## 2020-09-22 NOTE — Assessment & Plan Note (Signed)
Pain in lower abdomen x8-10 months. He has a history of diverticulitis with small bowel obstruction and abscess. He had exploratory laparotomy 3 years ago where they ended up removing adhesions. He is also concerned because his mother has a history and passed away from stomach cancer. U/A negative. Will check CMP, CBC, and CT abdomen/pelvis.

## 2020-09-22 NOTE — Assessment & Plan Note (Signed)
Currently drinking about 5-6 beers per day. This is decreased from how much he was drinking. Congratulated him on this. Discussed complete alcohol cessation as this can be leading to abnormal liver function and abdominal pain.

## 2020-09-22 NOTE — Assessment & Plan Note (Signed)
AST and ALT noted to be elevated several times over the past few years. Will check CMP, hepatitis B and C panel, and iron panel. He currently drinks 5-6 beers/day which is decreased from what he was drinking. Congratulated him on this and encouraged complete alcohol cessation. Discussed avoiding/limiting tylenol. Will follow-up as needed based on lab results.

## 2020-09-23 LAB — CBC WITH DIFFERENTIAL/PLATELET
Basophils Absolute: 0.1 10*3/uL (ref 0.0–0.2)
Basos: 1 %
EOS (ABSOLUTE): 0.1 10*3/uL (ref 0.0–0.4)
Eos: 1 %
Hematocrit: 42.8 % (ref 37.5–51.0)
Hemoglobin: 14.4 g/dL (ref 13.0–17.7)
Immature Grans (Abs): 0.1 10*3/uL (ref 0.0–0.1)
Immature Granulocytes: 1 %
Lymphocytes Absolute: 2.1 10*3/uL (ref 0.7–3.1)
Lymphs: 29 %
MCH: 30.8 pg (ref 26.6–33.0)
MCHC: 33.6 g/dL (ref 31.5–35.7)
MCV: 92 fL (ref 79–97)
Monocytes Absolute: 0.6 10*3/uL (ref 0.1–0.9)
Monocytes: 8 %
Neutrophils Absolute: 4.6 10*3/uL (ref 1.4–7.0)
Neutrophils: 60 %
Platelets: 314 10*3/uL (ref 150–450)
RBC: 4.68 x10E6/uL (ref 4.14–5.80)
RDW: 12.6 % (ref 11.6–15.4)
WBC: 7.5 10*3/uL (ref 3.4–10.8)

## 2020-09-23 LAB — IRON,TIBC AND FERRITIN PANEL
Ferritin: 276 ng/mL (ref 30–400)
Iron Saturation: 19 % (ref 15–55)
Iron: 70 ug/dL (ref 38–169)
Total Iron Binding Capacity: 377 ug/dL (ref 250–450)
UIBC: 307 ug/dL (ref 111–343)

## 2020-09-23 LAB — COMPREHENSIVE METABOLIC PANEL
ALT: 46 IU/L — ABNORMAL HIGH (ref 0–44)
AST: 31 IU/L (ref 0–40)
Albumin/Globulin Ratio: 1.8 (ref 1.2–2.2)
Albumin: 4.9 g/dL (ref 4.0–5.0)
Alkaline Phosphatase: 75 IU/L (ref 44–121)
BUN/Creatinine Ratio: 10 (ref 9–20)
BUN: 8 mg/dL (ref 6–20)
Bilirubin Total: 0.2 mg/dL (ref 0.0–1.2)
CO2: 23 mmol/L (ref 20–29)
Calcium: 9.8 mg/dL (ref 8.7–10.2)
Chloride: 98 mmol/L (ref 96–106)
Creatinine, Ser: 0.84 mg/dL (ref 0.76–1.27)
Globulin, Total: 2.8 g/dL (ref 1.5–4.5)
Glucose: 109 mg/dL — ABNORMAL HIGH (ref 65–99)
Potassium: 4.8 mmol/L (ref 3.5–5.2)
Sodium: 138 mmol/L (ref 134–144)
Total Protein: 7.7 g/dL (ref 6.0–8.5)
eGFR: 116 mL/min/{1.73_m2} (ref >59)

## 2020-09-23 LAB — HEPATITIS B SURFACE ANTIBODY,QUALITATIVE: Hep B Surface Ab, Qual: NONREACTIVE

## 2020-09-23 LAB — HEPATITIS B SURFACE ANTIGEN: Hepatitis B Surface Ag: NEGATIVE

## 2020-09-23 LAB — HEPATITIS C ANTIBODY: Hep C Virus Ab: <0.1 s/co ratio (ref 0.0–0.9)

## 2020-09-23 LAB — HEPATITIS B CORE ANTIBODY, IGM: Hep B C IgM: NEGATIVE

## 2020-09-24 ENCOUNTER — Ambulatory Visit: Payer: Self-pay | Admitting: *Deleted

## 2020-09-24 NOTE — Telephone Encounter (Signed)
Reviewed lab results and provider's note with the patient. Regarding the elevated blood sugar, he had a regular coke cherry before the blood draw that morning. Answered all questions at this time.

## 2020-10-07 ENCOUNTER — Ambulatory Visit
Admission: RE | Admit: 2020-10-07 | Discharge: 2020-10-07 | Disposition: A | Discharge status: Home / Self Care | Payer: Medicaid Other | Source: Ambulatory Visit | Attending: Nurse Practitioner | Admitting: Nurse Practitioner

## 2020-10-07 ENCOUNTER — Other Ambulatory Visit: Payer: Self-pay

## 2020-10-07 DIAGNOSIS — R1030 Lower abdominal pain, unspecified: Secondary | ICD-10-CM | POA: Insufficient documentation

## 2020-10-07 DIAGNOSIS — R140 Abdominal distension (gaseous): Secondary | ICD-10-CM | POA: Diagnosis present

## 2020-10-07 MED ORDER — IOHEXOL 350 MG/ML SOLN
100.0000 mL | Freq: Once | INTRAVENOUS | Status: AC | PRN
  Administered 2020-10-07: 100 mL via INTRAVENOUS

## 2020-10-13 ENCOUNTER — Ambulatory Visit: Payer: Self-pay | Admitting: *Deleted

## 2020-10-13 NOTE — Telephone Encounter (Signed)
Reason for Disposition  [1] Caller requesting NON-URGENT health information AND [2] PCP's office is the best resource    CT scan of abd results  Answer Assessment - Initial Assessment Questions 1. REASON FOR CALL or QUESTION: "What is your reason for calling today?" or "How can I best help you?" or "What question do you have that I can help answer?"     He had a CT scan done of his abd on 10/07/2020.  He saw his results on MyChart and needed someone to let him know what the results meant.  I let him know I would send this request to Vance Peper, NP and someone would give him a call back regarding the results.   He was agreeable to this plan.    Phone number in chart is correct.  Protocols used: Information Only Call - No Triage-A-AH

## 2020-10-13 NOTE — Telephone Encounter (Signed)
Received addendum from radiologist and there is no evidence of bowel obstruction on CT scan. If he is still having stomach pain, I can refer him to GI. Please let me know if he would like this ordered.

## 2020-10-13 NOTE — Telephone Encounter (Signed)
Pt called in regarding the results of his CT of abd and pelvis done on 10/07/2020.   He has the results on his MyChart and needed someone to interpret it for him,  See triage notes  I sent his request to [facility] for Vance Peper, NP.

## 2020-10-14 ENCOUNTER — Telehealth: Payer: Self-pay

## 2020-10-14 DIAGNOSIS — R1030 Lower abdominal pain, unspecified: Secondary | ICD-10-CM

## 2020-10-14 DIAGNOSIS — R140 Abdominal distension (gaseous): Secondary | ICD-10-CM

## 2020-10-14 NOTE — Telephone Encounter (Signed)
Copied from [place] [id number]. Topic: Referral - Request for Referral >> Oct 14, 2020 12:19 PM [place] wrote: Pt has received all his testing results but states was earlier discussed that if he kept having stomach pain that Lauren could refer to GI dr. Please FU with pt as is still having stomach pain and would like that referral. Pt would like local/[place]. Pt states that he thinks is getting worse, always distending and has pains when having bowel movements. FU with pt [phone]. Mother died from stomach cancer.   Routing to provider.

## 2020-10-14 NOTE — Telephone Encounter (Signed)
Left message for patient to inform him of recent imaging results. Advised patient to give our office a call back if he continues to have stomach pain and symptoms worsen.

## 2020-10-21 ENCOUNTER — Other Ambulatory Visit: Payer: Self-pay

## 2020-10-21 ENCOUNTER — Encounter: Payer: Self-pay | Admitting: Gastroenterology

## 2020-10-21 ENCOUNTER — Ambulatory Visit (INDEPENDENT_AMBULATORY_CARE_PROVIDER_SITE_OTHER): Payer: Medicaid Other | Admitting: Gastroenterology

## 2020-10-21 VITALS — BP 153/116 | HR 98 | Temp 100.0°F | Ht 72.0 in | Wt 184.0 lb

## 2020-10-21 DIAGNOSIS — K581 Irritable bowel syndrome with constipation: Secondary | ICD-10-CM | POA: Diagnosis not present

## 2020-10-21 DIAGNOSIS — Z791 Long term (current) use of non-steroidal anti-inflammatories (NSAID): Secondary | ICD-10-CM | POA: Diagnosis not present

## 2020-10-21 DIAGNOSIS — R140 Abdominal distension (gaseous): Secondary | ICD-10-CM

## 2020-10-21 DIAGNOSIS — R109 Unspecified abdominal pain: Secondary | ICD-10-CM | POA: Diagnosis not present

## 2020-10-21 MED ORDER — NA SULFATE-K SULFATE-MG SULF 17.5-3.13-1.6 GM/177ML PO SOLN: 354.0000 mL | Freq: Once | ORAL | 0 refills | Status: AC

## 2020-10-21 NOTE — Patient Instructions (Signed)
Please stop drinking soda pops.  Low-FODMAP Eating Plan FODMAP stands for fermentable oligosaccharides, disaccharides, monosaccharides, and polyols. These are sugars that are hard for some people to digest. A low-FODMAP eating plan may help some people who have irritable bowel syndrome (IBS) and certain other bowel (intestinal) diseases to manage their symptoms. This meal plan can be complicated to follow. Work with a diet and nutrition specialist (dietitian) to make a low-FODMAP eating plan that is right for you. A dietitian can help make sure that you get enough nutrition from this diet. What are tips for following this plan? Reading food labels Check labels for hidden FODMAPs such as: High-fructose syrup. Honey. Agave. Natural fruit flavors. Onion or garlic powder. Choose low-FODMAP foods that contain 3-4 grams of fiber per serving. Check food labels for serving sizes. Eat only one serving at a time to make sure FODMAP levels stay low. Shopping Shop with a list of foods that are recommended on this diet and make a meal plan. Meal planning Follow a low-FODMAP eating plan for up to 6 weeks, or as told by your health care provider or dietitian. To follow the eating plan: Eliminate high-FODMAP foods from your diet completely. Choose only low-FODMAP foods to eat. You will do this for 2-6 weeks. Gradually reintroduce high-FODMAP foods into your diet one at a time. Most people should wait a few days before introducing the next new high-FODMAP food into their meal plan. Your dietitian can recommend how quickly you may reintroduce foods. Keep a daily record of what and how much you eat and drink. Make note of any symptoms that you have after eating. Review your daily record with a dietitian regularly to identify which foods you can eat and which foods you should avoid. General tips Drink enough fluid each day to keep your urine pale yellow. Avoid processed foods. These often have added sugar and  may be high in FODMAPs. Avoid most dairy products, whole grains, and sweeteners. Work with a dietitian to make sure you get enough fiber in your diet. Avoid high FODMAP foods at meals to manage symptoms. Recommended foods Fruits Bananas, oranges, tangerines, lemons, limes, blueberries, raspberries, strawberries, grapes, cantaloupe, honeydew melon, kiwi, papaya, passion fruit, and pineapple. Limited amounts of dried cranberries, banana chips, and shredded coconut. Vegetables Eggplant, zucchini, cucumber, peppers, green beans, bean sprouts, lettuce, arugula, kale, Swiss chard, spinach, collard greens, bok choy, summer squash, potato, and tomato. Limited amounts of corn, carrot, and sweet potato. Green parts of scallions. Grains Gluten-free grains, such as rice, oats, buckwheat, quinoa, corn, polenta, and millet. Gluten-free pasta, bread, or cereal. Rice noodles. Corn tortillas. Meats and other proteins Unseasoned beef, pork, poultry, or fish. Eggs. Berniece Salines. Tofu (firm) and tempeh. Limited amounts of nuts and seeds, such as almonds, walnuts, [place] nuts, pecans, peanuts, nut butters, pumpkin seeds, chia seeds, and sunflower seeds. Dairy Lactose-free milk, yogurt, and kefir. Lactose-free cottage cheese and ice cream. Non-dairy milks, such as almond, coconut, hemp, and rice milk. Non-dairy yogurt. Limited amounts of goat cheese, brie, mozzarella, parmesan, swiss, and other hard cheeses. Fats and oils Butter-free spreads. Vegetable oils, such as olive, canola, and sunflower oil. Seasoning and other foods Artificial sweeteners with names that do not end in "ol," such as aspartame, saccharine, and stevia. Maple syrup, white table sugar, raw sugar, brown sugar, and molasses. Mayonnaise, soy sauce, and tamari. Fresh basil, coriander, parsley, rosemary, and thyme. Beverages Water and mineral water. Sugar-sweetened soft drinks. Small amounts of orange juice or cranberry juice. Black and  green tea. Most dry  wines. Coffee. The items listed above may not be a complete list of foods and beverages you can eat. Contact a dietitian for more information. Foods to avoid Fruits Fresh, dried, and juiced forms of apple, pear, watermelon, peach, plum, cherries, apricots, blackberries, boysenberries, figs, nectarines, and mango. Avocado. Vegetables Chicory root, artichoke, asparagus, cabbage, snow peas, Brussels sprouts, broccoli, sugar snap peas, mushrooms, celery, and cauliflower. Onions, garlic, leeks, and the white part of scallions. Grains Wheat, including kamut, durum, and semolina. Barley and bulgur. Couscous. Wheat-based cereals. Wheat noodles, bread, crackers, and pastries. Meats and other proteins Fried or fatty meat. Sausage. Cashews and pistachios. Soybeans, baked beans, black beans, chickpeas, kidney beans, fava beans, navy beans, lentils, black-eyed peas, and split peas. Dairy Milk, yogurt, ice cream, and soft cheese. Cream and sour cream. Milk-based sauces. Custard. Buttermilk. Soy milk. Seasoning and other foods Any sugar-free gum or candy. Foods that contain artificial sweeteners such as sorbitol, mannitol, isomalt, or xylitol. Foods that contain honey, high-fructose corn syrup, or agave. Bouillon, vegetable stock, beef stock, and chicken stock. Garlic and onion powder. Condiments made with onion, such as hummus, chutney, pickles, relish, salad dressing, and salsa. Tomato paste. Beverages Chicory-based drinks. Coffee substitutes. Chamomile tea. Fennel tea. Sweet or fortified wines such as port or sherry. Diet soft drinks made with isomalt, mannitol, maltitol, sorbitol, or xylitol. Apple, pear, and mango juice. Juices with high-fructose corn syrup. The items listed above may not be a complete list of foods and beverages you should avoid. Contact a dietitian for more information. Summary FODMAP stands for fermentable oligosaccharides, disaccharides, monosaccharides, and polyols. These are sugars  that are hard for some people to digest. A low-FODMAP eating plan is a short-term diet that helps to ease symptoms of certain bowel diseases. The eating plan usually lasts up to 6 weeks. After that, high-FODMAP foods are reintroduced gradually and one at a time. This can help you find out which foods may be causing symptoms. A low-FODMAP eating plan can be complicated. It is best to work with a dietitian who has experience with this type of plan. This information is not intended to replace advice given to you by your health care provider. Make sure you discuss any questions you have with your health care provider. Document Revised: 06/20/2019 Document Reviewed: 06/20/2019 Elsevier Patient Education  [place] What is this medication? POLYETHYLENE GLYCOL (pol ee ETH i leen; GLYE col) prevents and treats occasional constipation. It works by softening the stool, making it easier to have a bowel movement. It belongs to a group of medications called laxatives. This medicine may be used for other purposes; ask your health care provider or pharmacist if you have questions. COMMON BRAND NAME(S): GaviLax, GIALAX, GlycoLax, Healthylax, MiraLax, Smooth LAX, [facility] What should I tell my care team before I take this medication? They need to know if you have any of these conditions: History of blockage in your bowels Nausea Phenylketonuria Stomach or intestine problem Stomach pain Sudden change in bowel habit lasting more than 2 weeks Vomiting An unusual or allergic reaction to polyethylene glycol (PEG), other medications, foods, dyes, or preservatives Pregnant or trying to get pregnant Breast-feeding How should I use this medication? Take this medication by mouth. Take it as directed on the label. Add the right dose to 4 to 8 ounces or 120 to 240 mL of water, juice, soda, coffee or tea. Do not mix this medication with foods or other liquids. Do  not combine with  starch-based thickeners (e.g., flour, cornstarch, arrowroot, tapioca, xanthan gum). Mix well. Drink the solution. Do not use it more often than directed. Talk to your care team about the use of this medication in children. While it may be given to children as young as 16 years for selected conditions, precautions do apply. Overdosage: If you think you have taken too much of this medicine contact a poison control center or emergency room at once. NOTE: This medicine is only for you. Do not share this medicine with others. What if I miss a dose? If you miss a dose, take it as soon as you can. If it is almost time for your next dose, take only that dose. Do not take double or extra doses. What may interact with this medication? Interactions are not expected. This list may not describe all possible interactions. Give your health care provider a list of all the medicines, herbs, non-prescription drugs, or dietary supplements you use. Also tell them if you smoke, drink alcohol, or use illegal drugs. Some items may interact with your medicine. What should I watch for while using this medication? Do not use for more than one week without advice from your care team. If your constipation returns, check with your care team. Drink plenty of water while taking this medication. Drinking water helps decrease constipation. Stop using this medication and contact your care team if you experience any rectal bleeding or do not have a bowel movement after use. These could be signs of a more serious condition. What side effects may I notice from receiving this medication? Side effects that you should report to your care team as soon as possible: Allergic reactions-skin rash, itching, hives, swelling of the face, lips, tongue, or throat Side effects that usually do not require medical attention (report to your care team if they continue or are bothersome): Bloating Gas Nausea Stomach cramping This list may not  describe all possible side effects. Call your doctor for medical advice about side effects. You may report side effects to FDA at 1-800-FDA-1088. Where should I keep my medication? Keep out of the reach of children and pets. Store at room temperature between 20 and 25 degrees C (68 and 77 degrees F). Get rid of any unused medication after the expiration date. To get rid of medications that are no longer needed or have expired: Take the medication to a medication take-back program. Check with your pharmacy or law enforcement to find a location. If you cannot return the medication, check the label or package insert to see if the medication should be thrown out in the garbage or flushed down the toilet. If you are not sure, ask your care team. If it is safe to put it in the trash, pour the medication out of the container. Mix the medication with cat litter, dirt, coffee grounds, or other unwanted substance. Seal the mixture in a bag or container. Put it in the trash. NOTE: This sheet is a summary. It may not cover all possible information. If you have questions about this medicine, talk to your doctor, pharmacist, or health care provider.  2022 Elsevier/Gold Standard (2020-05-06 14:13:02)
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
## 2020-10-22 LAB — H. PYLORI BREATH TEST: H pylori Breath Test: NEGATIVE

## 2020-10-26 ENCOUNTER — Other Ambulatory Visit: Payer: Self-pay

## 2020-10-26 ENCOUNTER — Encounter: Payer: Self-pay | Admitting: Nurse Practitioner

## 2020-10-26 ENCOUNTER — Ambulatory Visit: Payer: Medicaid Other | Admitting: Nurse Practitioner

## 2020-10-26 VITALS — BP 122/71 | HR 98 | Temp 98.2°F | Ht 72.05 in | Wt 182.4 lb

## 2020-10-26 DIAGNOSIS — R1030 Lower abdominal pain, unspecified: Secondary | ICD-10-CM | POA: Diagnosis not present

## 2020-10-26 DIAGNOSIS — I10 Essential (primary) hypertension: Secondary | ICD-10-CM

## 2020-10-26 NOTE — Patient Instructions (Signed)
It was great to see you!  Keep checking your blood pressure at home a few times a week and write it down unless your machine stores it. The goal is to have your blood pressure less than 130/90.   Let's follow-up in 3 months, sooner if you have concerns.  We will be in touch with you about your results if done today.  You will see results as soon as they are available- it takes a MINIMUM of 3 business days for your provider to receive and review these results.  PLEASE ALLOW A MINIMUM OF 3 BUSINESS DAYS FOR A RESPONSE FROM YOUR PROVIDER ON YOUR RESULTS. If results are normal, and you have MyChart, you will receive a message stating they are normal. If you do not have MyChart, we will either call you or mail a letter stating your results are normal.  If your results are abnormal, you may receive a call or a MyChart message depending on the recommendations and any questions we have. If we are unable to reach you by phone, we will mail the results and recommendations to you. ** If you have not heard anything on your results through [facility] or a phone all in 7 business days, please contact the office to inquire.   If a referral was placed today, you will be contacted for an appointment. Please note that routine referrals can sometimes take up to 3-4 weeks to process. Please call our office if you haven't heard anything after this time frame.  Take care,  Vance Peper, NP
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
## 2020-10-26 NOTE — Assessment & Plan Note (Addendum)
Ongoing. CT abdomen was negative for obstruction. Following with Dr. Vicente Males with GI. Plan for colonoscopy/EGD on 11/03/20. Continue collaboration and recommendations from GI. F/U in 3 months.

## 2020-10-26 NOTE — Assessment & Plan Note (Signed)
Chronic, controlled. BP was slightly elevated in the office today. He brought his BP machine from home and it correlates to office BP. His blood pressures at home range 110s-130s/70s-80s. Majority in the 123456 systolic range. Continue lisinopril '20mg'$  daily. Check BMP today. F/U in 6 months.

## 2020-10-27 LAB — BASIC METABOLIC PANEL
BUN/Creatinine Ratio: 9 (ref 9–20)
BUN: 7 mg/dL (ref 6–20)
CO2: 22 mmol/L (ref 20–29)
Calcium: 10.1 mg/dL (ref 8.7–10.2)
Chloride: 99 mmol/L (ref 96–106)
Creatinine, Ser: 0.75 mg/dL — ABNORMAL LOW (ref 0.76–1.27)
Glucose: 98 mg/dL (ref 65–99)
Potassium: 4.7 mmol/L (ref 3.5–5.2)
Sodium: 137 mmol/L (ref 134–144)
eGFR: 120 mL/min/{1.73_m2} (ref >59)

## 2020-11-02 ENCOUNTER — Telehealth: Payer: Self-pay | Admitting: Gastroenterology

## 2020-11-02 ENCOUNTER — Encounter: Payer: Self-pay | Admitting: Gastroenterology

## 2020-11-02 NOTE — Telephone Encounter (Signed)
Pt. Calling with a question about his prep. He has a procedure on tomorrow 9/20 and would like a call back asap.

## 2020-11-03 ENCOUNTER — Ambulatory Visit: Payer: Medicaid Other | Admitting: Certified Registered"

## 2020-11-03 ENCOUNTER — Encounter
Admission: RE | Disposition: A | Discharge status: Home / Self Care | Payer: Self-pay | Source: Ambulatory Visit | Attending: Gastroenterology

## 2020-11-03 ENCOUNTER — Other Ambulatory Visit: Payer: Self-pay

## 2020-11-03 ENCOUNTER — Ambulatory Visit
Admission: RE | Admit: 2020-11-03 | Discharge: 2020-11-03 | Disposition: A | Discharge status: Home / Self Care | Payer: Medicaid Other | Source: Ambulatory Visit | Attending: Gastroenterology | Admitting: Gastroenterology

## 2020-11-03 ENCOUNTER — Encounter: Payer: Self-pay | Admitting: Gastroenterology

## 2020-11-03 DIAGNOSIS — D490 Neoplasm of unspecified behavior of digestive system: Secondary | ICD-10-CM | POA: Diagnosis not present

## 2020-11-03 DIAGNOSIS — Z888 Allergy status to other drugs, medicaments and biological substances status: Secondary | ICD-10-CM | POA: Insufficient documentation

## 2020-11-03 DIAGNOSIS — Z79899 Other long term (current) drug therapy: Secondary | ICD-10-CM | POA: Diagnosis not present

## 2020-11-03 DIAGNOSIS — D122 Benign neoplasm of ascending colon: Secondary | ICD-10-CM | POA: Diagnosis not present

## 2020-11-03 DIAGNOSIS — Z87891 Personal history of nicotine dependence: Secondary | ICD-10-CM | POA: Insufficient documentation

## 2020-11-03 DIAGNOSIS — K529 Noninfective gastroenteritis and colitis, unspecified: Secondary | ICD-10-CM | POA: Diagnosis not present

## 2020-11-03 DIAGNOSIS — R109 Unspecified abdominal pain: Secondary | ICD-10-CM | POA: Insufficient documentation

## 2020-11-03 DIAGNOSIS — Z8379 Family history of other diseases of the digestive system: Secondary | ICD-10-CM | POA: Diagnosis not present

## 2020-11-03 DIAGNOSIS — K581 Irritable bowel syndrome with constipation: Secondary | ICD-10-CM

## 2020-11-03 DIAGNOSIS — Z800 Family history of malignant neoplasm of digestive organs: Secondary | ICD-10-CM | POA: Diagnosis not present

## 2020-11-03 HISTORY — PX: ESOPHAGOGASTRODUODENOSCOPY (EGD) WITH PROPOFOL: SHX5813

## 2020-11-03 HISTORY — DX: Essential (primary) hypertension: I10

## 2020-11-03 HISTORY — PX: COLONOSCOPY WITH PROPOFOL: SHX5780

## 2020-11-03 SURGERY — ESOPHAGOGASTRODUODENOSCOPY (EGD) WITH PROPOFOL
Anesthesia: General

## 2020-11-03 MED ORDER — PROPOFOL 10 MG/ML IV BOLUS
INTRAVENOUS | Status: DC | PRN
  Administered 2020-11-03: 100 mg via INTRAVENOUS

## 2020-11-03 MED ORDER — GLYCOPYRROLATE 0.2 MG/ML IJ SOLN
INTRAMUSCULAR | Status: DC | PRN
  Administered 2020-11-03: .2 mg via INTRAVENOUS

## 2020-11-03 MED ORDER — MIDAZOLAM HCL 5 MG/5ML IJ SOLN
INTRAMUSCULAR | Status: DC | PRN
  Administered 2020-11-03: 2 mg via INTRAVENOUS

## 2020-11-03 MED ORDER — PROPOFOL 500 MG/50ML IV EMUL
INTRAVENOUS | Status: DC | PRN
  Administered 2020-11-03: 120 ug/kg/min via INTRAVENOUS

## 2020-11-03 MED ORDER — LIDOCAINE 2% (20 MG/ML) 5 ML SYRINGE
INTRAMUSCULAR | Status: DC | PRN
  Administered 2020-11-03: 25 mg via INTRAVENOUS

## 2020-11-03 MED ORDER — SODIUM CHLORIDE 0.9 % IV SOLN: INTRAVENOUS | Status: DC

## 2020-11-03 MED ORDER — MIDAZOLAM HCL 2 MG/2ML IJ SOLN
INTRAMUSCULAR | Status: AC
  Filled 2020-11-03: qty 2

## 2020-11-03 NOTE — Op Note (Signed)
[facility] Gastroenterology Patient Name: Martin Garcia Procedure Date: 11/03/2020 9:43 AM MRN: [id number] Account #: [id number] Date of Birth: 11-Oct-1985 Admit Type: Outpatient Age: 35 Room: [facility] ENDO ROOM 2 Gender: Male Note Status: Finalized Instrument Name: Upper Endoscope [id number] Procedure:             Upper GI endoscopy Indications:           Abdominal pain Providers:             Jonathon Bellows MD, MD Medicines:             Monitored Anesthesia Care Complications:         No immediate complications. Procedure:             Pre-Anesthesia Assessment:                        - Prior to the procedure, a History and Physical was                         performed, and patient medications, allergies and                         sensitivities were reviewed. The patient's tolerance                         of previous anesthesia was reviewed.                        - The risks and benefits of the procedure and the                         sedation options and risks were discussed with the                         patient. All questions were answered and informed                         consent was obtained.                        - ASA Grade Assessment: II - A patient with mild                         systemic disease.                        After obtaining informed consent, the endoscope was                         passed under direct vision. Throughout the procedure,                         the patient's blood pressure, pulse, and oxygen                         saturations were monitored continuously. The Endoscope                         was introduced through the mouth, and advanced to the  third part of duodenum. The upper GI endoscopy was                         accomplished with ease. The patient tolerated the                         procedure well. Findings:      The esophagus was normal.      The examined duodenum was normal.      The  entire examined stomach was normal. Biopsies were taken with a cold       forceps for histology.      The cardia and gastric fundus were normal on retroflexion. Impression:            - Normal esophagus.                        - Normal examined duodenum.                        - Normal stomach. Biopsied. Recommendation:        - Await pathology results.                        - Perform a colonoscopy today. Procedure Code(s):     --- Professional ---                        [phone], Esophagogastroduodenoscopy, flexible,                         transoral; with biopsy, single or multiple Diagnosis Code(s):     --- Professional ---                        R10.9, Unspecified abdominal pain CPT copyright 2019 American Medical Association. All rights reserved. The codes documented in this report are preliminary and upon coder review may  be revised to meet current compliance requirements. Jonathon Bellows, MD Jonathon Bellows MD, MD 11/03/2020 10:01:31 AM This report has been signed electronically. Number of Addenda: 0 Note Initiated On: 11/03/2020 9:43 AM Estimated Blood Loss:  Estimated blood loss: none.      [facility]

## 2020-11-03 NOTE — Op Note (Signed)
[facility] Gastroenterology Patient Name: Martin Garcia Procedure Date: 11/03/2020 9:40 AM MRN: [id number] Account #: [id number] Date of Birth: Aug 30, 1985 Admit Type: Outpatient Age: 35 Room: [facility] ENDO ROOM 2 Gender: Male Note Status: Finalized Instrument Name: Park Meo [id number] Procedure:             Colonoscopy Indications:           Abdominal pain Providers:             Jonathon Bellows MD, MD Medicines:             Monitored Anesthesia Care Complications:         No immediate complications. Procedure:             Pre-Anesthesia Assessment:                        - Prior to the procedure, a History and Physical was                         performed, and patient medications, allergies and                         sensitivities were reviewed. The patient's tolerance                         of previous anesthesia was reviewed.                        - The risks and benefits of the procedure and the                         sedation options and risks were discussed with the                         patient. All questions were answered and informed                         consent was obtained.                        - ASA Grade Assessment: II - A patient with mild                         systemic disease.                        After obtaining informed consent, the colonoscope was                         passed under direct vision. Throughout the procedure,                         the patient's blood pressure, pulse, and oxygen                         saturations were monitored continuously. The                         Colonoscope was introduced through the anus and  advanced to the the cecum, identified by the                         appendiceal orifice. The colonoscopy was performed                         with ease. The patient tolerated the procedure well.                         The quality of the bowel preparation was poor. Findings:       The perianal and digital rectal examinations were normal.      A 20 mm polypoid lesion was found in the proximal ascending colon. The       lesion was sessile. No bleeding was present. This was biopsied with a       cold forceps for histology.      A large amount of semi-liquid stool was found in the entire colon,       precluding visualization. Impression:            - Preparation of the colon was poor.                        - Likely benign polypoid lesion in the proximal                         ascending colon. Biopsied.                        - Stool in the entire examined colon. Recommendation:        - Discharge patient to home (with escort).                        - Resume previous diet.                        - Continue present medications.                        - Await pathology results.                        - If biopsies show polyp then will need repeat                         colonoscopy with 2 day prep to resect lesio0n Procedure Code(s):     --- Professional ---                        [phone], Colonoscopy, flexible; with biopsy, single or                         multiple Diagnosis Code(s):     --- Professional ---                        D49.0, Neoplasm of unspecified behavior of digestive                         system  R10.9, Unspecified abdominal pain CPT copyright 2019 American Medical Association. All rights reserved. The codes documented in this report are preliminary and upon coder review may  be revised to meet current compliance requirements. Jonathon Bellows, MD Jonathon Bellows MD, MD 11/03/2020 10:12:25 AM This report has been signed electronically. Number of Addenda: 0 Note Initiated On: 11/03/2020 9:40 AM Scope Withdrawal Time: 0 hours 5 minutes 24 seconds  Total Procedure Duration: 0 hours 6 minutes 33 seconds  Estimated Blood Loss:  Estimated blood loss: none.      [facility]
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
## 2020-11-03 NOTE — Telephone Encounter (Signed)
Pt currently admitted to [facility]. Tried contacting pt back but no voicemail available.
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
## 2020-11-03 NOTE — Anesthesia Postprocedure Evaluation (Signed)
Anesthesia Post Note  Patient: Martin Garcia  Procedure(s) Performed: ESOPHAGOGASTRODUODENOSCOPY (EGD) WITH PROPOFOL COLONOSCOPY WITH PROPOFOL  Patient location during evaluation: Endoscopy Anesthesia Type: General Level of consciousness: awake and alert Pain management: pain level controlled Vital Signs Assessment: post-procedure vital signs reviewed and stable Respiratory status: spontaneous breathing, nonlabored ventilation, respiratory function stable and patient connected to nasal cannula oxygen Cardiovascular status: blood pressure returned to baseline and stable Postop Assessment: no apparent nausea or vomiting Anesthetic complications: no   No notable events documented.   Last Vitals:  Vitals:   11/03/20 1030 11/03/20 1040  BP: 97/75 104/74  Pulse: (!) 106 90  Resp: 18 11  Temp:    SpO2: 97% 98%    Last Pain:  Vitals:   11/03/20 1010  TempSrc: Temporal  PainSc:                  Martha Clan

## 2020-11-03 NOTE — Transfer of Care (Signed)
Immediate Anesthesia Transfer of Care Note  Patient: Martin Garcia  Procedure(s) Performed: ESOPHAGOGASTRODUODENOSCOPY (EGD) WITH PROPOFOL COLONOSCOPY WITH PROPOFOL  Patient Location: Endoscopy Unit  Anesthesia Type:General  Level of Consciousness: drowsy  Airway & Oxygen Therapy: Patient Spontanous Breathing  Post-op Assessment: Report given to RN and Post -op Vital signs reviewed and stable  Post vital signs: Reviewed  Last Vitals:  Vitals Value Taken Time  BP 92/71 11/03/20 1014  Temp    Pulse 87 11/03/20 1014  Resp 10 11/03/20 1014  SpO2 94 % 11/03/20 1014  Vitals shown include unvalidated device data.  Last Pain:  Vitals:   11/03/20 0918  TempSrc: Temporal  PainSc: 0-No pain         Complications: No notable events documented.

## 2020-11-04 ENCOUNTER — Encounter: Payer: Self-pay | Admitting: Gastroenterology

## 2020-11-04 LAB — SURGICAL PATHOLOGY

## 2020-11-15 ENCOUNTER — Encounter: Payer: Self-pay | Admitting: Gastroenterology

## 2020-11-20 ENCOUNTER — Other Ambulatory Visit: Payer: Self-pay | Admitting: Nurse Practitioner

## 2020-11-20 NOTE — Telephone Encounter (Signed)
Requested Prescriptions  Pending Prescriptions Disp Refills  . lisinopril (ZESTRIL) 20 MG tablet [Pharmacy Med Name: LISINOPRIL 20 MG TABLET] 30 tablet 1    Sig: TAKE 1 TABLET BY MOUTH EVERY DAY     Cardiovascular:  ACE Inhibitors Failed - 11/20/2020  9:27 AM      Failed - Cr in normal range and within 180 days    Creatinine  Date Value Ref Range Status  06/13/2014 0.73 mg/dL Final    Comment:    0.61-1.24 NOTE: New Reference Range  04/22/14    Creatinine, Ser  Date Value Ref Range Status  10/26/2020 0.75 (L) 0.76 - 1.27 mg/dL Final         Passed - K in normal range and within 180 days    Potassium  Date Value Ref Range Status  10/26/2020 4.7 3.5 - 5.2 mmol/L Final  06/13/2014 3.7 mmol/L Final    Comment:    3.5-5.1 NOTE: New Reference Range  04/22/14          Passed - Patient is not pregnant      Passed - Last BP in normal range    BP Readings from Last 1 Encounters:  11/03/20 104/74         Passed - Valid encounter within last 6 months    Recent Outpatient Visits          3 weeks ago Essential hypertension   [place], Lauren A, NP   1 month ago Essential hypertension   [place], Scheryl Darter, NP      Future Appointments            In 1 month Jonathon Bellows, MD [place]   In 2 months McElwee, Scheryl Darter, NP [place]

## 2020-11-20 NOTE — Telephone Encounter (Unsigned)
Pt states he thought Lauren was to give him enough refills on his lisinopril (ZESTRIL) 20 MG tablet to get him through to his appointment in December.  But here are no more.  Please send enough to get him

## 2020-12-22 ENCOUNTER — Ambulatory Visit (INDEPENDENT_AMBULATORY_CARE_PROVIDER_SITE_OTHER): Payer: Medicaid Other | Admitting: Gastroenterology

## 2020-12-22 VITALS — BP 147/96 | HR 79 | Temp 98.6°F | Wt 181.0 lb

## 2020-12-22 DIAGNOSIS — R109 Unspecified abdominal pain: Secondary | ICD-10-CM

## 2020-12-22 DIAGNOSIS — K581 Irritable bowel syndrome with constipation: Secondary | ICD-10-CM

## 2020-12-22 DIAGNOSIS — K625 Hemorrhage of anus and rectum: Secondary | ICD-10-CM | POA: Diagnosis not present

## 2020-12-22 NOTE — Patient Instructions (Signed)
Take IBgard as instructed on the box  Start Fiber supplement

## 2020-12-22 NOTE — Progress Notes (Signed)
Jonathon Bellows MD, MRCP(U.K) [address]  [place]  [place], [place] [address]  Main: [phone]  Fax: [phone]   Primary Care Physician: Charyl Dancer, NP  Primary Gastroenterologist:  Dr. Jonathon Bellows   C/c : Follow up for abdominal pain   HPI: Martin Garcia is a 35 y.o. male  Summary of history : Initially referred and seen on 10/21/2020 for abdominal pain. He states that he has had abdominal pain for over a year.  Episodic.  Gradually getting worse.  Occurs on a daily basis.  Right lower quadrant and left lower quadrant nonradiating feels like a sharp pain at times and a pressure at times.  Usually occurs right after eating.  Relieved after bowel movement.  He has a bowel movement every day.  He may feel at times that he has not evacuated his bowels completely.  He was taking Motrin on a daily basis for many years.  He has stopped taking that few months back.  Drinks a bottle of soda every day in the morning.  He does have a lot of gas and bloating.  No weight loss rather weight gain.  Does not smoke but vapes.  He presented emergency room on 05/19/2020 for chest pain but no mention of abdominal pain.  Seen by his primary care provider on 09/22/2020 and was complaining of left lower quadrant and right lower quadrant pain for 8 to 10 months.  Found to have elevated LFTs.  Exploratory laparotomy 3 years back very left having adhesions removed out.  Had a CT scan of the abdomen on 10/07/2020 that showed sigmoid diverticulosis and no evidence of bowel obstruction.   09/22/2020: Iron studies normal, hemoglobin 14.4 g, CMP normal except for elevated glucose ALT of 46 and AST of 31 hep C antibody negative hepatitis B surface antigen negative, hepatitis B surface antibody nonreactive 05/23/2017: Colonoscopy:Diverticulosis of sigmoid colon noted no other abnormalities noted.     Interval history   10/21/2020-12/22/2020  10/21/2020: H pylri breath test - negative 9/20/22022: EGD: Normal ,  colonoscopy : poor prep - polypoid lesion seen - bx taken showed focal active colitis. No features of chronicity.  He is taking MiraLAX for constipation.  Still continues to have colicky abdominal pain at times.  He is taking methadone for pain.  Diet is low in fiber.  Has had few episodes of rectal bleeding.   Current Outpatient Medications  Medication Sig Dispense Refill   lisinopril (ZESTRIL) 20 MG tablet TAKE 1 TABLET BY MOUTH EVERY DAY 30 tablet 1   methadone (DOLOPHINE) 10 MG/ML solution Take 100 mg by mouth daily.     Multiple Vitamins-Minerals (MULTIVITAMIN MEN) TABS Take 1 tablet by mouth daily.     No current facility-administered medications for this visit.    Allergies as of 12/22/2020 - Review Complete 12/22/2020  Allergen Reaction Noted   Tramadol Nausea And Vomiting 06/01/2013    ROS:  General: Negative for anorexia, weight loss, fever, chills, fatigue, weakness. ENT: Negative for hoarseness, difficulty swallowing , nasal congestion. CV: Negative for chest pain, angina, palpitations, dyspnea on exertion, peripheral edema.  Respiratory: Negative for dyspnea at rest, dyspnea on exertion, cough, sputum, wheezing.  GI: See history of present illness. GU:  Negative for dysuria, hematuria, urinary incontinence, urinary frequency, nocturnal urination.  Endo: Negative for unusual weight change.    Physical Examination:   BP (!) 147/96   Pulse 79   Temp 98.6 F (37 C) (Oral)   Wt 181  lb (82.1 kg)   BMI 24.55 kg/m   General: Well-nourished, well-developed in no acute distress.  Eyes: No icterus. Conjunctivae pink. Mouth: Oropharyngeal mucosa moist and pink , no lesions erythema or exudate. Extremities: No lower extremity edema. No clubbing or deformities. Neuro: Alert and oriented x 3.  Grossly intact. Skin: Warm and dry, no jaundice.   Psych: Alert and cooperative, normal mood and affect. Anoscopy exam with chaperone present in room.  Externally no abnormalities  noted.  No abnormalities felt on digital examination.  With anoscope inserted on withdrawal noted grade 1 hemorrhoids in all 3 positions.  Imaging Studies: No results found.  Assessment and Plan:   Martin Garcia is a 36 y.o. y/o male here to follow up for 1 year history of abdominal pain. EGD showed no abnormalities- colonoscopy showed area of focal active colitis with no chronicity which could have been from NSAID use.  Likely chronic pain related to combination of c constipation and pain from additions due to prior abdominal surgery.  Rectal bleeding likely secondary to constipation and hemorrhoids      Plan 1.  Continue MiraLAX on a daily basis if needed a lower dose 2.  Trial of IBgard for abdominal pain.  Likely related to adhesions from prior surgery. 3.  If no better at next visit we will complete her amitriptyline for pain. 4.  Avoid NSAIDs. 5.  High-fiber diet with an aim of 35 g of fiber per day patient information provided. 6.  Conservative management of hemorrhoids discussed high-fiber diet.  If no better Causing bleeding will need banding of hemorrhoids.   Dr Jonathon Bellows  MD,MRCP [facility]) Follow up in 8 to 12 weeks
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
## 2021-01-25 ENCOUNTER — Encounter: Payer: Self-pay | Admitting: Nurse Practitioner

## 2021-01-25 ENCOUNTER — Other Ambulatory Visit: Payer: Self-pay

## 2021-01-25 ENCOUNTER — Ambulatory Visit (INDEPENDENT_AMBULATORY_CARE_PROVIDER_SITE_OTHER): Payer: Medicaid Other | Admitting: Nurse Practitioner

## 2021-01-25 VITALS — BP 135/85 | HR 102 | Temp 98.4°F | Ht 71.5 in | Wt 181.4 lb

## 2021-01-25 DIAGNOSIS — R7989 Other specified abnormal findings of blood chemistry: Secondary | ICD-10-CM

## 2021-01-25 DIAGNOSIS — I10 Essential (primary) hypertension: Secondary | ICD-10-CM | POA: Diagnosis not present

## 2021-01-25 DIAGNOSIS — R1030 Lower abdominal pain, unspecified: Secondary | ICD-10-CM | POA: Diagnosis not present

## 2021-01-25 MED ORDER — LISINOPRIL 20 MG PO TABS: 20.0000 mg | ORAL_TABLET | Freq: Every day | ORAL | 1 refills | Status: DC

## 2021-01-25 NOTE — Assessment & Plan Note (Signed)
Chronic, stable. Blood pressure is 120s/80s at home. Continue current regimen. Follow up in 6 months for physical.

## 2021-01-25 NOTE — Assessment & Plan Note (Signed)
Chronic, ongoing. Continue collaboration and recommendations from GI. If ongoing abdominal pain that is not controlled, consider referral to general surgery.

## 2021-03-22 ENCOUNTER — Other Ambulatory Visit: Payer: Self-pay

## 2021-03-22 ENCOUNTER — Ambulatory Visit (INDEPENDENT_AMBULATORY_CARE_PROVIDER_SITE_OTHER): Payer: Medicaid Other | Admitting: Gastroenterology

## 2021-03-22 ENCOUNTER — Encounter: Payer: Self-pay | Admitting: Gastroenterology

## 2021-03-22 VITALS — BP 144/95 | HR 109 | Temp 99.1°F | Wt 178.6 lb

## 2021-03-22 DIAGNOSIS — R109 Unspecified abdominal pain: Secondary | ICD-10-CM | POA: Diagnosis not present

## 2021-03-22 DIAGNOSIS — K581 Irritable bowel syndrome with constipation: Secondary | ICD-10-CM

## 2021-03-22 MED ORDER — PEG 3350-KCL-NA BICARB-NACL 420 G PO SOLR: ORAL | 0 refills | Status: DC

## 2021-03-22 NOTE — Patient Instructions (Signed)
Please try taking Trulance and in a week please send [place] a MyChart message to let us know if it had worked or not.

## 2021-03-22 NOTE — Progress Notes (Signed)
Jonathon Bellows MD, MRCP(U.K) [address]  [place]  [place], [place] [address]  Main: [phone]  Fax: [phone]   Primary Care Physician: Charlynne Cousins, MD  Primary Gastroenterologist:  Dr. Jonathon Bellows   Chief Complaint  Patient presents with   Abdominal Pain    HPI: Martin Garcia is a 36 y.o. male   Summary of history : Initially referred and seen on 10/21/2020 for abdominal pain. He states that he has had abdominal pain for over a year.  Episodic.  Gradually getting worse.  Occurs on a daily basis.  Right lower quadrant and left lower quadrant nonradiating feels like a sharp pain at times and a pressure at times.  Usually occurs right after eating.  Relieved after bowel movement.  He has a bowel movement every day.  He may feel at times that he has not evacuated his bowels completely.  He was taking Motrin on a daily basis for many years.  He has stopped taking that few months back.  Drinks a bottle of soda every day in the morning.  He does have a lot of gas and bloating.  No weight loss rather weight gain.  Does not smoke but vapes.   He presented emergency room on 05/19/2020 for chest pain .  Found to have elevated LFTs.  Exploratory laparotomy 3 years back very left having adhesions removed out.  Had a CT scan of the abdomen on 10/07/2020 that showed sigmoid diverticulosis and no evidence of bowel obstruction.   09/22/2020: Iron studies normal, hemoglobin 14.4 g, CMP normal except for elevated glucose ALT of 46 and AST of 31 hep C antibody negative hepatitis B surface antigen negative, hepatitis B surface antibody nonreactive 05/23/2017: Colonoscopy:Diverticulosis of sigmoid colon noted no other abnormalities noted.   10/21/2020: H pylri breath test - negative 9/20/22022: EGD: Normal , colonoscopy : poor prep - polypoid lesion seen - bx taken showed focal active colitis. No features of chronicity.     Interval history  12/22/2020-03/22/2021   Since his last visit he has been  taking MiraLAX but not having good bowel movements feels bloated and uncomfortable significant abdominal discomfort. Denies any rectal bleeding.    Current Outpatient Medications  Medication Sig Dispense Refill   lisinopril (ZESTRIL) 20 MG tablet Take 1 tablet (20 mg total) by mouth daily. 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     Multiple Vitamins-Minerals (MULTIVITAMIN MEN) TABS Take 1 tablet by mouth daily.     polyethylene glycol (MIRALAX / GLYCOLAX) 17 g packet Take 17 g by mouth every other day.     polyethylene glycol-electrolytes (NULYTELY) 420 g solution Drink one 8 oz glass of mixture every 15 minutes until you finish the jug. 4000 mL 0   No current facility-administered medications for this visit.    Allergies as of 03/22/2021 - Review Complete 03/22/2021  Allergen Reaction Noted   Tramadol Nausea And Vomiting 06/01/2013    ROS:  General: Negative for anorexia, weight loss, fever, chills, fatigue, weakness. ENT: Negative for hoarseness, difficulty swallowing , nasal congestion. CV: Negative for chest pain, angina, palpitations, dyspnea on exertion, peripheral edema.  Respiratory: Negative for dyspnea at rest, dyspnea on exertion, cough, sputum, wheezing.  GI: See history of present illness. GU:  Negative for dysuria, hematuria, urinary incontinence, urinary frequency, nocturnal urination.  Endo: Negative for unusual weight change.    Physical Examination:   BP (!) 144/95  Pulse (!) 109    Temp 99.1 F (37.3 C) (Oral)    Wt 178 lb 9.6 oz (81 kg)    BMI 24.56 kg/m   General: Well-nourished, well-developed in no acute distress.  Eyes: No icterus. Conjunctivae pink. Mouth: Oropharyngeal mucosa moist and pink , no lesions erythema or exudate. Lungs: Clear to auscultation bilaterally. Non-labored. Heart: Regular rate and rhythm, no murmurs rubs or gallops.  Abdomen: Bowel sounds are normal, nontender, general distention no  hepatosplenomegaly or masses, no abdominal bruits or hernia , no rebound or guarding.   Extremities: No lower extremity edema. No clubbing or deformities. Neuro: Alert and oriented x 3.  Grossly intact. Skin: Warm and dry, no jaundice.   Psych: Alert and cooperative, normal mood and affect.   Imaging Studies: No results found.  Assessment and Plan:   Martin Garcia is a 36 y.o. y/o male here to follow up for 1 year history of abdominal pain. EGD showed no abnormalities- colonoscopy showed area of focal active colitis with no chronicity which could have been from NSAID use.  Likely chronic pain related to combination of c constipation and pain from adhesions due to prior abdominal surgery and effects of methadone.       Plan 1.  Continue IBgard since it has helped him 2.  I believe the MiraLAX not helping him we will commence him on Trulance after a cleanout with [employer].  1 week supply of Trulance has been provided if it does not work he is going to call my office for a trial of Linzess or Amitiza.  If he does well he will call my office for prescription. 3.  If above 2 do not work will Warden/ranger as he is on methadone.  Patient information provided.   Dr Jonathon Bellows  MD,MRCP [facility]) Follow up in 8 weeks

## 2021-03-31 ENCOUNTER — Encounter: Payer: Self-pay | Admitting: Gastroenterology

## 2021-03-31 ENCOUNTER — Other Ambulatory Visit: Payer: Self-pay

## 2021-03-31 MED ORDER — TRULANCE 3 MG PO TABS: 1.0000 | ORAL_TABLET | Freq: Every day | ORAL | 6 refills | Status: DC

## 2021-04-05 ENCOUNTER — Telehealth (INDEPENDENT_AMBULATORY_CARE_PROVIDER_SITE_OTHER): Payer: Medicaid Other | Admitting: Gastroenterology

## 2021-04-05 DIAGNOSIS — T402X5A Adverse effect of other opioids, initial encounter: Secondary | ICD-10-CM

## 2021-04-05 DIAGNOSIS — K5903 Drug induced constipation: Secondary | ICD-10-CM | POA: Diagnosis not present

## 2021-04-05 NOTE — Progress Notes (Signed)
Jonathon Bellows , MD [address]  [place], [place] [address]  Main: [phone]  Fax: [phone]   Primary Care Physician: Charlynne Cousins, MD  Virtual Visit via Video Note  I connected with patient on 04/05/21 at  3:00 PM EST by video and verified that I am speaking with the correct person using two identifiers.   I discussed the limitations, risks, security and privacy concerns of performing an evaluation and management service by video  and the availability of in person appointments. I also discussed with the patient that there may be a patient responsible charge related to this service. The patient expressed understanding and agreed to proceed.  Location of Patient: Home Location of Provider: Home Persons involved: Patient and provider only   History of Present Illness: Chief Complaint  Patient presents with   Constipation    HPI: Martin Garcia is a 36 y.o. male  Summary of history : Initially referred and seen on 10/21/2020 for abdominal pain. He states that he has had abdominal pain for over a year.  Episodic.  Gradually getting worse.  Occurs on a daily basis.  Right lower quadrant and left lower quadrant nonradiating feels like a sharp pain at times and a pressure at times.  Usually occurs right after eating.  Relieved after bowel movement.  He has a bowel movement every day.  He may feel at times that he has not evacuated his bowels completely.  He was taking Motrin on a daily basis for many years.  He has stopped taking that few months back.  Drinks a bottle of soda every day in the morning.  He does have a lot of gas and bloating.  No weight loss rather weight gain.  Does not smoke but vapes.   He presented emergency room on 05/19/2020 for chest pain .  Found to have elevated LFTs.  Exploratory laparotomy 3 years back very left having adhesions removed out.  Had a CT scan of the abdomen on 10/07/2020 that showed sigmoid diverticulosis and no evidence of bowel  obstruction.   09/22/2020: Iron studies normal, hemoglobin 14.4 g, CMP normal except for elevated glucose ALT of 46 and AST of 31 hep C antibody negative hepatitis B surface antigen negative, hepatitis B surface antibody nonreactive 05/23/2017: Colonoscopy:Diverticulosis of sigmoid colon noted no other abnormalities noted.   10/21/2020: H pylri breath test - negative 9/20/22022: EGD: Normal , colonoscopy : poor prep - polypoid lesion seen - bx taken showed focal active colitis. No features of chronicity.       Interval history  03/22/2021-04/05/2021  At his last visit I gave him samples of Trulance which she said worked very well and he had very good bowel movements with it.  I attempted to give him a prescription but was refused by the insurance company who wanted [place] to try Movantik's first.  I discussed with the patient that with his extensive abdominal surgery and laparotomy where he was having extensive adhesions that were taken out I would feel uncomfortable prescribing him Movantik since his associated with bowel perforations.  Once I discussed this with the patient he was completely against trying Movantik's as well.      Current Outpatient Medications  Medication Sig Dispense Refill   lisinopril (ZESTRIL) 20 MG tablet Take 1 tablet (20 mg total) by mouth daily. 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     Multiple Vitamins-Minerals (MULTIVITAMIN MEN)  TABS Take 1 tablet by mouth daily.     Plecanatide (TRULANCE) 3 MG TABS Take 1 tablet by mouth daily. 30 tablet 6   polyethylene glycol (MIRALAX / GLYCOLAX) 17 g packet Take 17 g by mouth every other day.     No current facility-administered medications for this visit.    Allergies as of 04/05/2021 - Review Complete 04/05/2021  Allergen Reaction Noted   Tramadol Nausea And Vomiting 06/01/2013    Review of Systems:    All systems reviewed and negative except where noted in HPI.  General  Appearance:    Alert, cooperative, no distress, appears stated age  Head:    Normocephalic, without obvious abnormality, atraumatic  Eyes:    PERRL, conjunctiva/corneas clear,  Ears:    Grossly normal hearing    Neurologic:  Grossly normal    Observations/Objective:  Labs: CMP     Component Value Date/Time   NA 137 10/26/2020 1026   NA 138 06/13/2014 1837   K 4.7 10/26/2020 1026   K 3.7 06/13/2014 1837   CL 99 10/26/2020 1026   CL 105 06/13/2014 1837   CO2 22 10/26/2020 1026   CO2 24 06/13/2014 1837   GLUCOSE 98 10/26/2020 1026   GLUCOSE 249 (H) 06/01/2019 0859   GLUCOSE 91 06/13/2014 1837   BUN 7 10/26/2020 1026   BUN 10 06/13/2014 1837   CREATININE 0.75 (L) 10/26/2020 1026   CREATININE 0.73 06/13/2014 1837   CALCIUM 10.1 10/26/2020 1026   CALCIUM 8.4 (L) 06/13/2014 1837   PROT 7.7 09/22/2020 1105   PROT 7.8 06/13/2014 1837   ALBUMIN 4.9 09/22/2020 1105   ALBUMIN 4.6 06/13/2014 1837   AST 31 09/22/2020 1105   AST 42 (H) 06/13/2014 1837   ALT 46 (H) 09/22/2020 1105   ALT 21 06/13/2014 1837   ALKPHOS 75 09/22/2020 1105   ALKPHOS 56 06/13/2014 1837   BILITOT 0.2 09/22/2020 1105   BILITOT 0.4 06/13/2014 1837   GFRNONAA >60 06/01/2019 0859   GFRNONAA >60 06/13/2014 1837   GFRAA >60 06/01/2019 0859   GFRAA >60 06/13/2014 1837   Lab Results  Component Value Date   WBC 7.5 09/22/2020   HGB 14.4 09/22/2020   HCT 42.8 09/22/2020   MCV 92 09/22/2020   PLT 314 09/22/2020    Imaging Studies: No results found.  Assessment and Plan:   Martin Garcia is a 36 y.o. y/o male  here to follow up for 1 year history of abdominal pain. EGD showed no abnormalities- colonoscopy showed area of focal active colitis with no chronicity which could have been from NSAID use.  Likely chronic pain related to combination of c constipation and pain from adhesions due to prior abdominal surgery and effects of methadone.  He has responded very well to Trulance.  I attempted to prescribe  him the medication but the insurance company refused it and I advised her to try Movantik first.  I discussed this with the patient and I explained that since he has had prior bowel surgeries and an exploratory laparotomy without lesions were noted I would feel uncomfortable prescribing this medication as associated with bowel perforations.  The patient was unhappy to start Movantik's.  I will appeal to the insurance to contest the use of Trulance which has helped him immensely.  We will try to provide him samples in the interim.  While on Trulance he has not had any abdominal pain.         I discussed the assessment  and treatment plan with the patient. The patient was provided an opportunity to ask questions and all were answered. The patient agreed with the plan and demonstrated an understanding of the instructions.   The patient was advised to call back or seek an in-person evaluation if the symptoms worsen or if the condition fails to improve as anticipated.  I provided 12 minutes of face-to-face time during this encounter.  Dr Jonathon Bellows MD,MRCP [facility]) Gastroenterology/Hepatology Pager: [phone]   Speech recognition software was used to dictate this note.

## 2021-04-06 ENCOUNTER — Encounter: Payer: Self-pay | Admitting: Gastroenterology

## 2021-04-07 NOTE — Telephone Encounter (Signed)
Inform Mr Menges ,   Noted his email. I probably mis understood.  Since trulance helped with bowel movements - will try and get it approved by insurance by [employer], Herb Grays can we have the company come and drop some samples. We will reasses his pain when he has taken Trulance for 2-3 weeks continiously and then decide on any further evaluation of the pain.

## 2021-04-08 ENCOUNTER — Other Ambulatory Visit: Payer: Self-pay

## 2021-04-08 MED ORDER — TRULANCE 3 MG PO TABS: 1.0000 | ORAL_TABLET | Freq: Every day | ORAL | 11 refills | Status: DC

## 2021-07-06 ENCOUNTER — Other Ambulatory Visit: Payer: Self-pay | Admitting: Nurse Practitioner

## 2021-07-26 ENCOUNTER — Ambulatory Visit: Payer: Medicaid Other | Admitting: Internal Medicine

## 2021-07-26 ENCOUNTER — Ambulatory Visit (INDEPENDENT_AMBULATORY_CARE_PROVIDER_SITE_OTHER): Payer: Medicaid Other | Admitting: Physician Assistant

## 2021-07-26 ENCOUNTER — Encounter: Payer: Self-pay | Admitting: Physician Assistant

## 2021-07-26 VITALS — BP 122/87 | HR 102 | Temp 99.1°F | Ht 71.5 in | Wt 181.0 lb

## 2021-07-26 DIAGNOSIS — I10 Essential (primary) hypertension: Secondary | ICD-10-CM

## 2021-07-26 DIAGNOSIS — Z0000 Encounter for general adult medical examination without abnormal findings: Secondary | ICD-10-CM

## 2021-07-26 DIAGNOSIS — R1030 Lower abdominal pain, unspecified: Secondary | ICD-10-CM

## 2021-07-26 MED ORDER — LISINOPRIL 20 MG PO TABS: 20.0000 mg | ORAL_TABLET | Freq: Every day | ORAL | 0 refills | Status: DC
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
## 2021-07-26 NOTE — Assessment & Plan Note (Signed)
Chronic, ongoing condition  Reports he is seeing GI regularly but does not feel satisfied with answers and plan Reports continued LLQ pain and tenderness, diarrhea and does not understand why he continues to have this Requests second opinion with different GI practice- referral placed to assist Recommend continued coordination with GI  Follow up as needed

## 2021-07-26 NOTE — Assessment & Plan Note (Signed)
Chronic, historic condition Appears well controlled on Lisinopril 20 mg PO QD Continue medications Will order lipid panel to assess cholesterol Refill of Lisinopril provided Follow up in 3 months for monitoring

## 2021-07-26 NOTE — Patient Instructions (Signed)
Please make a lab apt to have your yearly labs drawn- these need to be done fasting ( nothing to eat or drink at least 8 hours prior. You may have water or black coffee and you can take your regular medications prior to the labs but please limit your intake to these things)

## 2021-08-02 ENCOUNTER — Other Ambulatory Visit: Payer: Self-pay

## 2021-08-02 ENCOUNTER — Other Ambulatory Visit: Payer: Medicaid Other

## 2021-08-02 DIAGNOSIS — Z0000 Encounter for general adult medical examination without abnormal findings: Secondary | ICD-10-CM

## 2021-08-03 LAB — COMPREHENSIVE METABOLIC PANEL
ALT: 17 IU/L (ref 0–44)
AST: 14 IU/L (ref 0–40)
Albumin/Globulin Ratio: 1.8 (ref 1.2–2.2)
Albumin: 4.8 g/dL (ref 4.0–5.0)
Alkaline Phosphatase: 80 IU/L (ref 44–121)
BUN/Creatinine Ratio: 10 (ref 9–20)
BUN: 9 mg/dL (ref 6–20)
Bilirubin Total: <0.2 mg/dL (ref 0.0–1.2)
CO2: 22 mmol/L (ref 20–29)
Calcium: 9.8 mg/dL (ref 8.7–10.2)
Chloride: 100 mmol/L (ref 96–106)
Creatinine, Ser: 0.87 mg/dL (ref 0.76–1.27)
Globulin, Total: 2.7 g/dL (ref 1.5–4.5)
Glucose: 90 mg/dL (ref 70–99)
Potassium: 4.7 mmol/L (ref 3.5–5.2)
Sodium: 140 mmol/L (ref 134–144)
Total Protein: 7.5 g/dL (ref 6.0–8.5)
eGFR: 114 mL/min/{1.73_m2} (ref >59)

## 2021-08-03 LAB — CBC WITH DIFFERENTIAL/PLATELET
Basophils Absolute: 0 10*3/uL (ref 0.0–0.2)
Basos: 0 %
EOS (ABSOLUTE): 0.1 10*3/uL (ref 0.0–0.4)
Eos: 1 %
Hematocrit: 40.6 % (ref 37.5–51.0)
Hemoglobin: 13.5 g/dL (ref 13.0–17.7)
Immature Grans (Abs): 0 10*3/uL (ref 0.0–0.1)
Immature Granulocytes: 0 %
Lymphocytes Absolute: 2.6 10*3/uL (ref 0.7–3.1)
Lymphs: 36 %
MCH: 29.2 pg (ref 26.6–33.0)
MCHC: 33.3 g/dL (ref 31.5–35.7)
MCV: 88 fL (ref 79–97)
Monocytes Absolute: 0.5 10*3/uL (ref 0.1–0.9)
Monocytes: 7 %
Neutrophils Absolute: 4 10*3/uL (ref 1.4–7.0)
Neutrophils: 56 %
Platelets: 307 10*3/uL (ref 150–450)
RBC: 4.63 x10E6/uL (ref 4.14–5.80)
RDW: 12.8 % (ref 11.6–15.4)
WBC: 7.3 10*3/uL (ref 3.4–10.8)

## 2021-08-03 LAB — LIPID PANEL
Chol/HDL Ratio: 5.4 ratio — ABNORMAL HIGH (ref 0.0–5.0)
Cholesterol, Total: 194 mg/dL (ref 100–199)
HDL: 36 mg/dL — ABNORMAL LOW (ref >39)
LDL Chol Calc (NIH): 120 mg/dL — ABNORMAL HIGH (ref 0–99)
Triglycerides: 216 mg/dL — ABNORMAL HIGH (ref 0–149)
VLDL Cholesterol Cal: 38 mg/dL (ref 5–40)

## 2021-10-26 ENCOUNTER — Ambulatory Visit (INDEPENDENT_AMBULATORY_CARE_PROVIDER_SITE_OTHER): Payer: Medicaid Other | Admitting: Unknown Physician Specialty

## 2021-10-26 ENCOUNTER — Encounter: Payer: Self-pay | Admitting: Unknown Physician Specialty

## 2021-10-26 DIAGNOSIS — F1010 Alcohol abuse, uncomplicated: Secondary | ICD-10-CM

## 2021-10-26 DIAGNOSIS — K5792 Diverticulitis of intestine, part unspecified, without perforation or abscess without bleeding: Secondary | ICD-10-CM

## 2021-10-26 DIAGNOSIS — I10 Essential (primary) hypertension: Secondary | ICD-10-CM | POA: Diagnosis not present

## 2021-10-26 MED ORDER — LISINOPRIL 20 MG PO TABS: 20.0000 mg | ORAL_TABLET | Freq: Every day | ORAL | 0 refills | Status: DC

## 2021-10-26 NOTE — Progress Notes (Signed)
BP (!) 148/85   Pulse 70   Temp 98.6 F (37 C) (Oral)   Ht 5' 11.5" (1.816 m)   Wt 180 lb 6.4 oz (81.8 kg)   SpO2 98%   BMI 24.81 kg/m    Subjective:    Patient ID: Martin Garcia, male    DOB: Jan 22, 1986, 36 y.o.   MRN: [id number]  HPI: Martin Garcia is a 36 y.o. male  Chief Complaint  Patient presents with   Hypertension   Hypertension Using medications without difficulty Average home BPs Usually below 120/80   No problems or lightheadedness No chest pain with exertion or shortness of breath No Edema  Abdominal pain S/P abcesses from diverticulitis. Having "flares" of pain with swelling.  Seeing GI and being monitored.      Relevant past medical, surgical, family and social history reviewed and updated as indicated. Interim medical history since our last visit reviewed. Allergies and medications reviewed and updated.  Review of Systems  Per HPI unless specifically indicated above     Objective:    BP (!) 148/85   Pulse 70   Temp 98.6 F (37 C) (Oral)   Ht 5' 11.5" (1.816 m)   Wt 180 lb 6.4 oz (81.8 kg)   SpO2 98%   BMI 24.81 kg/m   Wt Readings from Last 3 Encounters:  10/26/21 180 lb 6.4 oz (81.8 kg)  07/26/21 181 lb (82.1 kg)  03/22/21 178 lb 9.6 oz (81 kg)    Physical Exam Constitutional:      General: He is not in acute distress.    Appearance: Normal appearance. He is well-developed.  HENT:     Head: Normocephalic and atraumatic.  Eyes:     General: Lids are normal. No scleral icterus.       Right eye: No discharge.        Left eye: No discharge.     Conjunctiva/sclera: Conjunctivae normal.  Neck:     Vascular: No carotid bruit or JVD.  Cardiovascular:     Rate and Rhythm: Normal rate and regular rhythm.     Heart sounds: Normal heart sounds.  Pulmonary:     Effort: Pulmonary effort is normal. No respiratory distress.     Breath sounds: Normal breath sounds.  Abdominal:     Palpations: There is no hepatomegaly or splenomegaly.   Musculoskeletal:        General: Normal range of motion.     Cervical back: Normal range of motion and neck supple.  Skin:    General: Skin is warm and dry.     Coloration: Skin is not pale.     Findings: No rash.  Neurological:     Mental Status: He is alert and oriented to person, place, and time.  Psychiatric:        Behavior: Behavior normal.        Thought Content: Thought content normal.        Judgment: Judgment normal.     Results for orders placed or performed in visit on 08/02/21  Comp Met (CMET)  Result Value Ref Range   Glucose 90 70 - 99 mg/dL   BUN 9 6 - 20 mg/dL   Creatinine, Ser 0.87 0.76 - 1.27 mg/dL   eGFR 114 >59 mL/min/1.73   BUN/Creatinine Ratio 10 9 - 20   Sodium 140 134 - 144 mmol/L   Potassium 4.7 3.5 - 5.2 mmol/L   Chloride 100 96 - 106 mmol/L   CO2  22 20 - 29 mmol/L   Calcium 9.8 8.7 - 10.2 mg/dL   Total Protein 7.5 6.0 - 8.5 g/dL   Albumin 4.8 4.0 - 5.0 g/dL   Globulin, Total 2.7 1.5 - 4.5 g/dL   Albumin/Globulin Ratio 1.8 1.2 - 2.2   Bilirubin Total <0.2 0.0 - 1.2 mg/dL   Alkaline Phosphatase 80 44 - 121 IU/L   AST 14 0 - 40 IU/L   ALT 17 0 - 44 IU/L  Lipid Profile  Result Value Ref Range   Cholesterol, Total 194 100 - 199 mg/dL   Triglycerides 216 (H) 0 - 149 mg/dL   HDL 36 (L) >39 mg/dL   VLDL Cholesterol Cal 38 5 - 40 mg/dL   LDL Chol Calc (NIH) 120 (H) 0 - 99 mg/dL   Chol/HDL Ratio 5.4 (H) 0.0 - 5.0 ratio  CBC with Differential/Platelet  Result Value Ref Range   WBC 7.3 3.4 - 10.8 x10E3/uL   RBC 4.63 4.14 - 5.80 x10E6/uL   Hemoglobin 13.5 13.0 - 17.7 g/dL   Hematocrit 40.6 37.5 - 51.0 %   MCV 88 79 - 97 fL   MCH 29.2 26.6 - 33.0 pg   MCHC 33.3 31.5 - 35.7 g/dL   RDW 12.8 11.6 - 15.4 %   Platelets 307 150 - 450 x10E3/uL   Neutrophils 56 Not Estab. %   Lymphs 36 Not Estab. %   Monocytes 7 Not Estab. %   Eos 1 Not Estab. %   Basos 0 Not Estab. %   Neutrophils Absolute 4.0 1.4 - 7.0 x10E3/uL   Lymphocytes Absolute 2.6 0.7 -  3.1 x10E3/uL   Monocytes Absolute 0.5 0.1 - 0.9 x10E3/uL   EOS (ABSOLUTE) 0.1 0.0 - 0.4 x10E3/uL   Basophils Absolute 0.0 0.0 - 0.2 x10E3/uL   Immature Granulocytes 0 Not Estab. %   Immature Grans (Abs) 0.0 0.0 - 0.1 x10E3/uL      Assessment & Plan:   Problem List Items Addressed This Visit       Unprioritized   Alcohol abuse    Quit x 8 months      Diverticulitis    Frequent flares with pain and swelling      Essential hypertension    Stable, continue present medications.          Follow up plan: Return in about 9 months (around 07/27/2022). For physical

## 2021-10-26 NOTE — Assessment & Plan Note (Signed)
Quit x 8 months

## 2021-10-26 NOTE — Assessment & Plan Note (Signed)
Stable, continue present medications.   

## 2021-10-26 NOTE — Assessment & Plan Note (Signed)
Frequent flares with pain and swelling

## 2022-01-23 ENCOUNTER — Other Ambulatory Visit: Payer: Self-pay | Admitting: Unknown Physician Specialty

## 2022-01-23 DIAGNOSIS — I10 Essential (primary) hypertension: Secondary | ICD-10-CM

## 2022-01-25 NOTE — Telephone Encounter (Signed)
Last seen 10/26/21. Advised to return for CPE next year.

## 2022-01-25 NOTE — Telephone Encounter (Signed)
Requested medications are due for refill today.  yes  Requested medications are on the active medications list.  yes  Last refill. 10/26/2021#90 0 rf  Future visit scheduled.   no  Notes to clinic.  Dr. Neomia Dear listed as PCP.    Requested Prescriptions  Pending Prescriptions Disp Refills   lisinopril (ZESTRIL) 20 MG tablet [Pharmacy Med Name: LISINOPRIL 20 MG TABLET] 90 tablet 0    Sig: TAKE 1 TABLET BY MOUTH EVERY DAY     Cardiovascular:  ACE Inhibitors Failed - 01/23/2022  1:48 PM      Failed - Last BP in normal range    BP Readings from Last 1 Encounters:  10/26/21 (!) 148/85         Passed - Cr in normal range and within 180 days    Creatinine  Date Value Ref Range Status  06/13/2014 0.73 mg/dL Final    Comment:    0.61-1.24 NOTE: New Reference Range  04/22/14    Creatinine, Ser  Date Value Ref Range Status  08/02/2021 0.87 0.76 - 1.27 mg/dL Final         Passed - K in normal range and within 180 days    Potassium  Date Value Ref Range Status  08/02/2021 4.7 3.5 - 5.2 mmol/L Final  06/13/2014 3.7 mmol/L Final    Comment:    3.5-5.1 NOTE: New Reference Range  04/22/14          Passed - Patient is not pregnant      Passed - Valid encounter within last 6 months    Recent Outpatient Visits           3 months ago Essential hypertension   [facility] Kathrine Haddock, NP   6 months ago Annual physical exam   [facility] Mecum, Dani Gobble, PA-C   1 year ago Lower abdominal pain   [place], Lauren A, NP   1 year ago Essential hypertension   [place], Lauren A, NP   1 year ago Essential hypertension   [place] [facility], Scheryl Darter, NP

## 2022-04-19 ENCOUNTER — Other Ambulatory Visit: Payer: Self-pay | Admitting: Gastroenterology

## 2022-04-19 ENCOUNTER — Other Ambulatory Visit: Payer: Self-pay | Admitting: Unknown Physician Specialty

## 2022-04-19 DIAGNOSIS — I10 Essential (primary) hypertension: Secondary | ICD-10-CM

## 2022-04-20 NOTE — Telephone Encounter (Signed)
Requested medication (s) are due for refill today - yes  Requested medication (s) are on the active medication list -yes  Future visit scheduled -no  Last refill: 01/25/22 #90  Notes to clinic: patient has not transferred care to another provider- Dr Neomia Dear still listed  Requested Prescriptions  Pending Prescriptions Disp Refills   lisinopril (ZESTRIL) 20 MG tablet [Pharmacy Med Name: LISINOPRIL 20 MG TABLET] 90 tablet 0    Sig: TAKE 1 TABLET BY MOUTH EVERY DAY     Cardiovascular:  ACE Inhibitors Failed - 04/19/2022  5:57 PM      Failed - Cr in normal range and within 180 days    Creatinine  Date Value Ref Range Status  06/13/2014 0.73 mg/dL Final    Comment:    0.61-1.24 NOTE: New Reference Range  04/22/14    Creatinine, Ser  Date Value Ref Range Status  08/02/2021 0.87 0.76 - 1.27 mg/dL Final         Failed - K in normal range and within 180 days    Potassium  Date Value Ref Range Status  08/02/2021 4.7 3.5 - 5.2 mmol/L Final  06/13/2014 3.7 mmol/L Final    Comment:    3.5-5.1 NOTE: New Reference Range  04/22/14          Failed - Last BP in normal range    BP Readings from Last 1 Encounters:  10/26/21 (!) 148/85         Passed - Patient is not pregnant      Passed - Valid encounter within last 6 months    Recent Outpatient Visits           5 months ago Essential hypertension   [place] Kathrine Haddock, NP   8 months ago Annual physical exam   [place], Dani Gobble, PA-C   1 year ago Lower abdominal pain   [place] McElwee, Lauren A, NP   1 year ago Essential hypertension   [place] McElwee, Lauren A, NP   1 year ago Essential hypertension   [place], Lauren A, NP                 Requested Prescriptions  Pending Prescriptions Disp Refills   lisinopril (ZESTRIL) 20 MG tablet [Pharmacy Med Name:  LISINOPRIL 20 MG TABLET] 90 tablet 0    Sig: TAKE 1 TABLET BY MOUTH EVERY DAY     Cardiovascular:  ACE Inhibitors Failed - 04/19/2022  5:57 PM      Failed - Cr in normal range and within 180 days    Creatinine  Date Value Ref Range Status  06/13/2014 0.73 mg/dL Final    Comment:    0.61-1.24 NOTE: New Reference Range  04/22/14    Creatinine, Ser  Date Value Ref Range Status  08/02/2021 0.87 0.76 - 1.27 mg/dL Final         Failed - K in normal range and within 180 days    Potassium  Date Value Ref Range Status  08/02/2021 4.7 3.5 - 5.2 mmol/L Final  06/13/2014 3.7 mmol/L Final    Comment:    3.5-5.1 NOTE: New Reference Range  04/22/14          Failed - Last BP in normal range    BP Readings from Last 1 Encounters:  10/26/21 (!) 148/85         Passed - Patient  is not pregnant      Passed - Valid encounter within last 6 months    Recent Outpatient Visits           5 months ago Essential hypertension   [place] Kathrine Haddock, NP   8 months ago Annual physical exam   [place], Dani Gobble, PA-C   1 year ago Lower abdominal pain   [place] [facility], Scheryl Darter, NP   1 year ago Essential hypertension   [place] McElwee, Scheryl Darter, NP   1 year ago Essential hypertension   [place] McElwee, Scheryl Darter, NP

## 2022-04-20 NOTE — Telephone Encounter (Signed)
Previous Dr. Neomia Dear patient. Please review for refill.

## 2022-04-22 ENCOUNTER — Ambulatory Visit: Payer: Self-pay | Admitting: *Deleted

## 2022-04-22 ENCOUNTER — Encounter: Payer: Self-pay | Admitting: Physician Assistant

## 2022-04-22 ENCOUNTER — Ambulatory Visit (INDEPENDENT_AMBULATORY_CARE_PROVIDER_SITE_OTHER): Payer: Medicaid Other | Admitting: Physician Assistant

## 2022-04-22 VITALS — BP 176/124 | HR 90 | Temp 97.8°F | Ht 71.5 in | Wt 190.7 lb

## 2022-04-22 DIAGNOSIS — R1084 Generalized abdominal pain: Secondary | ICD-10-CM

## 2022-04-22 DIAGNOSIS — R109 Unspecified abdominal pain: Secondary | ICD-10-CM

## 2022-04-22 DIAGNOSIS — R350 Frequency of micturition: Secondary | ICD-10-CM | POA: Diagnosis not present

## 2022-04-22 LAB — URINALYSIS, ROUTINE W REFLEX MICROSCOPIC
Bilirubin, UA: NEGATIVE
Glucose, UA: NEGATIVE
Ketones, UA: NEGATIVE
Leukocytes,UA: NEGATIVE
Nitrite, UA: NEGATIVE
Protein,UA: NEGATIVE
RBC, UA: NEGATIVE
Specific Gravity, UA: <1.005 — ABNORMAL LOW (ref 1.005–1.030)
Urobilinogen, Ur: 0.2 mg/dL (ref 0.2–1.0)
pH, UA: 6.5 (ref 5.0–7.5)

## 2022-04-22 MED ORDER — KETOROLAC TROMETHAMINE 60 MG/2ML IM SOLN
60.0000 mg | Freq: Once | INTRAMUSCULAR | Status: AC
  Administered 2022-04-22: 60 mg via INTRAMUSCULAR

## 2022-04-22 NOTE — Telephone Encounter (Signed)
Patient needs to get his xray to confirm stone and size. If it is too big to pass on his own he will need to be referred to Urology and Flomax will not be very helpful.

## 2022-04-22 NOTE — Patient Instructions (Addendum)
Please go here for your  xray   Martin Garcia, [place] [id number]     I suspect you may have a kidney stone at this time Please stay well hydrated and we will keep you updated on the results of your xray

## 2022-04-22 NOTE — Telephone Encounter (Signed)
Copied from [place] [id number]. Topic: General - Other >> Apr 22, 2022 11:00 AM Dominique A wrote: Reason for CRM: Pt states that when he was in the office this morning he was told by PCP that she would call in Flomax for him to be able to pass his Kidney Stones. Pt went to the pharmacy to pick up the medication and it is not at the pharmacy. Please call pt back.

## 2022-04-22 NOTE — Progress Notes (Unsigned)
Acute Office Visit   Patient: Martin Garcia   DOB: 1985-09-07   37 y.o. Male  MRN: [id number] Visit Date: 04/22/2022  Today's healthcare provider: Dani Gobble Quida Glasser, PA-C  Introduced myself to the patient as a Journalist, newspaper and provided education on APPs in clinical practice.    Chief Complaint  Patient presents with   Back Pain    Started on Wednesday, with sharp low back pain, has h/o kidney stone, thinks he may be passing another at this time.    Subjective    Back Pain Pertinent negatives include no dysuria, fever, headaches, numbness or weakness.   HPI     Back Pain    Additional comments: Started on Wednesday, with sharp low back pain, has h/o kidney stone, thinks he may be passing another at this time.       Last edited by Jerelene Redden, CMA on 04/22/2022 10:02 AM.       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   Multiple Vitamins-Minerals (MULTIVITAMIN MEN) TABS Take 1 tablet by mouth daily.   polyethylene glycol (MIRALAX / GLYCOLAX) 17 g packet Take 17 g by mouth every other day.   TRULANCE 3 MG TABS TAKE 1  TABLET BY MOUTH EVERY DAY (Patient not taking: Reported on 04/22/2022)   No facility-administered medications prior to visit.    Review of Systems  Constitutional:  Negative for chills, fatigue and fever.  Genitourinary:  Positive for flank pain. Negative for difficulty urinating, dysuria, enuresis, frequency, hematuria, penile pain, penile swelling, scrotal swelling, testicular pain and urgency.  Musculoskeletal:  Positive for back pain.  Neurological:  Negative for tremors, weakness, numbness and headaches.    {Labs  Heme  Chem  Endocrine  Serology  Results Review (optional):23779}   Objective    BP (!) 176/124   Pulse 90   Temp 97.8 F (36.6 C) (Oral)   Ht 5' 11.5" (1.816 m)   Wt 190 lb 11.2 oz (86.5 kg)   SpO2 99%   BMI 26.23 kg/m  {Show previous vital signs (optional):23777}  Physical Exam Vitals reviewed.  Constitutional:      General: He is awake.     Appearance: Normal appearance. He is well-developed and well-groomed.  HENT:     Head: Normocephalic and atraumatic.  Pulmonary:     Effort: Pulmonary effort is normal.  Abdominal:     General: Abdomen is flat. Bowel sounds are normal.     Palpations: Abdomen is soft.     Tenderness:  There is no abdominal tenderness. There is no right CVA tenderness, left CVA tenderness or guarding.  Musculoskeletal:     Lumbar back: Decreased range of motion.  Neurological:     Mental Status: He is alert.  Psychiatric:        Behavior: Behavior is cooperative.       No results found for any visits on 04/22/22.  Assessment & Plan      No follow-ups on file.       Problem List Items Addressed This Visit   None Visit Diagnoses     Flank pain    -  Primary   Relevant Medications   ketorolac (TORADOL) injection 60 mg (Start on 04/22/2022 10:45 AM)   Other Relevant Orders   DG Abd 1 View   Urine frequency       Relevant Orders   Urinalysis, Routine w reflex microscopic        No follow-ups on file.   I,  Kais Monje E Roya Gieselman, PA-C, have reviewed all documentation for this visit. The documentation on 04/22/22 for the exam, diagnosis, procedures, and orders are all accurate and complete.   Talitha Givens, MHS, PA-C [facility]

## 2022-04-22 NOTE — Telephone Encounter (Signed)
Reason for Disposition  [1] SEVERE back pain (e.g., excruciating, unable to do any normal activities) AND [2] not improved 2 hours after pain medicine  Answer Assessment - Initial Assessment Questions 1. ONSET: "When did the pain begin?"      Started Wed.   Happened while I was at work.   I had a kidney stone before 15 years ago.   This feels similar.     I called out of work so I need a note.  I called out of work yesterday.    2. LOCATION: "Where does it hurt?" (upper, mid or lower back)     Started on right side lower back and now to left and middle lower back For the last week I'm peeing more often and having urgency and not going that much when I go.    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 3. SEVERITY: "How bad is the pain?"  (e.g., Scale 1-10; mild, moderate, or severe)   - MILD (1-3): Doesn't interfere with normal activities.    - MODERATE (4-7): Interferes with normal activities or awakens from sleep.    - SEVERE (8-10): Excruciating pain, unable to do any normal activities.      I'm having to pee a lot.    Back pain is 4/10 sitting still.     If I bend over it goes up to an 8-9/10. 4. PATTERN: "Is the pain constant?" (e.g., yes, no; constant, intermittent)      It comes and goes.   Movement makes it worse.   I'll get a sharp pain. 5. RADIATION: "Does the pain shoot into your legs or somewhere else?"     No 6. CAUSE:  "What do you think is causing the back pain?"      A possible kidney stone being passed 7. BACK OVERUSE:  "Any recent lifting of heavy objects, strenuous work or exercise?"     No 8. MEDICINES: "What have you taken so far for the pain?" (e.g., nothing, acetaminophen, NSAIDS)     I've been taking ibuprofen.   This morning I took 2 ibuprofen this morning and Tylenol at the same time.    9. NEUROLOGIC SYMPTOMS: "Do you have any weakness, numbness, or problems with bowel/bladder control?"     Not asked    10. OTHER SYMPTOMS: "Do you have  any other symptoms?" (e.g., fever, abdomen pain, burning with urination, blood in urine)       See above 11. PREGNANCY: "Is there any chance you are pregnant?" "When was your last menstrual period?"       N/A  Protocols used: Back Pain-A-AH

## 2022-04-22 NOTE — Telephone Encounter (Signed)
Called and notified patient of Erin's message.

## 2022-04-22 NOTE — Telephone Encounter (Signed)
  Chief Complaint: Urinary frequency, urgency, Right lower back pain radiating across to left side.   Hx of kidney stone 15 yrs ago Symptoms: See above Frequency: Started Wed with lower back pain and frequency with urgency. Pertinent Negatives: Patient denies blood in urine, bad smell or burning with urination Disposition: [] ED /[] Urgent Care (no appt availability in office) / [x] Appointment(In office/virtual)/ []  [place] [facility]/ [] Home Care/ [] Refused Recommended Disposition /[] [place] [facility]/ []  Follow-up with PCP Additional Notes: Appt. Made for today with Erin Mecum, PA-C for 10:20.

## 2022-04-25 ENCOUNTER — Ambulatory Visit
Admission: RE | Admit: 2022-04-25 | Discharge: 2022-04-25 | Disposition: A | Discharge status: Home / Self Care | Payer: Medicaid Other | Source: Ambulatory Visit | Attending: Physician Assistant | Admitting: Physician Assistant

## 2022-04-25 ENCOUNTER — Ambulatory Visit: Payer: Self-pay | Admitting: *Deleted

## 2022-04-25 DIAGNOSIS — R109 Unspecified abdominal pain: Secondary | ICD-10-CM

## 2022-04-25 NOTE — Progress Notes (Signed)
Your xray did not show evidence of a kidney stone at this time but xrays can sometimes miss these if they are very small or hidden behind other structures. If you are still having symptoms I recommend we proceed with scheduling a CT scan to check further. Your urinalysis did not show signs of infection or blood which is good. Let us know if you are still having symptoms or concerns.

## 2022-04-25 NOTE — Telephone Encounter (Signed)
  Chief Complaint: worsening pain since xray completed today . Back pain /groin pain  Symptoms: painful urination. No burning . C/o pain sides back groin area. Difficulty standing straight. Difficulty performing hygiene needs "can't even wipe my butt".  Frequency: na  Pertinent Negatives: Patient denies na  Disposition: [x] ED /[] Urgent Care (no appt availability in office) / [] Appointment(In office/virtual)/ []  [place] [facility]/ [] Home Care/ [] Refused Recommended Disposition /[] [place] [facility]/ [x]  Follow-up with PCP Additional Notes:   Recommended ED due to worsening pain  since x ray.  Pt given x ray  results per notes of E. Mecum, NP on 04/25/22. Pt verbalized understanding xray did not show evidence of a kidney stone at this time but xrays can sometimes miss these if they are small or hidden behind other structures. Patient reports he is still having sx and would like to proceed with CT scan . Patient reports he reviewed xray results via My Chart. Please advise and patient would like a call back from PCP. Katina notified  NT sending of office for review.    .   Reason for Disposition  Patient sounds very sick or weak to the triager  Answer Assessment - Initial Assessment Questions 1. ONSET: "When did the pain begin?"      Has been seen by provider regarding pain in sides, back and groin area  2. LOCATION: "Where does it hurt?" (upper, mid or lower back)     Groin area  3. SEVERITY: "How bad is the pain?"  (e.g., Scale 1-10; mild, moderate, or severe)   - MILD (1-3): Doesn't interfere with normal activities.    - MODERATE (4-7): Interferes with normal activities or awakens from sleep.    - SEVERE (8-10): Excruciating pain, unable to do any normal activities.      Difficulty standing straight without pain and difficulty performing hygiene needs, "can't hardly wipe my butt" 4. PATTERN: "Is the pain constant?" (e.g., yes, no; constant, intermittent)     Not every time urinating  but does feel pain with urination at times  5. RADIATION: "Does the pain shoot into your legs or somewhere else?"     na 6. CAUSE:  "What do you think is causing the back pain?"      Na  7. BACK OVERUSE:  "Any recent lifting of heavy objects, strenuous work or exercise?"     na 8. MEDICINES: "What have you taken so far for the pain?" (e.g., nothing, acetaminophen, NSAIDS)     na 9. NEUROLOGIC SYMPTOMS: "Do you have any weakness, numbness, or problems with bowel/bladder control?"     Na  10. OTHER SYMPTOMS: "Do you have any other symptoms?" (e.g., fever, abdomen pain, burning with urination, blood in urine)       Pain with urination at times.  11. PREGNANCY: "Is there any chance you are pregnant?" "When was your last menstrual period?"       na  Protocols used: Back Pain-A-AH

## 2022-04-25 NOTE — Telephone Encounter (Signed)
Patient seen by Junie Panning, routing to her to advise.

## 2022-04-26 ENCOUNTER — Ambulatory Visit
Admission: RE | Admit: 2022-04-26 | Discharge: 2022-04-26 | Disposition: A | Discharge status: Home / Self Care | Payer: Medicaid Other | Source: Ambulatory Visit | Attending: Physician Assistant | Admitting: Physician Assistant

## 2022-04-26 ENCOUNTER — Ambulatory Visit: Payer: Self-pay | Admitting: *Deleted

## 2022-04-26 DIAGNOSIS — R1084 Generalized abdominal pain: Secondary | ICD-10-CM | POA: Diagnosis present

## 2022-04-26 DIAGNOSIS — R109 Unspecified abdominal pain: Secondary | ICD-10-CM | POA: Insufficient documentation

## 2022-04-26 MED ORDER — TAMSULOSIN HCL 0.4 MG PO CAPS: 0.4000 mg | ORAL_CAPSULE | Freq: Every day | ORAL | 0 refills | Status: DC

## 2022-04-26 NOTE — Telephone Encounter (Signed)
Called and notified patient of Erin's message. Patient verbalized understanding.  

## 2022-04-26 NOTE — Addendum Note (Signed)
Addended by: Talitha Givens on: 04/26/2022 08:30 AM   Modules accepted: Orders, Level of Service

## 2022-04-26 NOTE — Telephone Encounter (Signed)
Patient called to review CT results and call disconnected prior to transfer from agent. NT called patient back and patient reports he was getting a call from practice and hung up to speak with staff regarding CT results. Patient verbalized understanding and has appt for tomorrow. Recommended if pain worsens go to ED if needed. Pain not severe enough to go to ED per patient at this time.    Reason for Disposition  [1] Follow-up call to recent contact AND [2] information only call, no triage required  Answer Assessment - Initial Assessment Questions 1. REASON FOR CALL or QUESTION: "What is your reason for calling today?" or "How can I best help you?" or "What question do you have that I can help answer?"     Calling to review CT results and another call from practice taken from patient and results were reviewed per patient.  Protocols used: Information Only Call - No Triage-A-AH

## 2022-04-26 NOTE — Progress Notes (Signed)
Your CT scan did not show evidence of kidney stones at this time. It is possible that you have already passed one that was causing the pain and discomfort or the pain may be due to a muscular strain. If the pain continues or is getting worse, I recommend scheduling an office apt to discuss further.

## 2022-04-27 ENCOUNTER — Encounter: Payer: Self-pay | Admitting: Nurse Practitioner

## 2022-04-27 ENCOUNTER — Ambulatory Visit (INDEPENDENT_AMBULATORY_CARE_PROVIDER_SITE_OTHER): Payer: Medicaid Other | Admitting: Nurse Practitioner

## 2022-04-27 VITALS — BP 150/58 | HR 83 | Temp 98.2°F | Wt 190.3 lb

## 2022-04-27 DIAGNOSIS — M5441 Lumbago with sciatica, right side: Secondary | ICD-10-CM | POA: Diagnosis not present

## 2022-04-27 MED ORDER — TRIAMCINOLONE ACETONIDE 40 MG/ML IJ SUSP
40.0000 mg | Freq: Once | INTRAMUSCULAR | Status: AC
  Administered 2022-04-27: 40 mg via INTRAMUSCULAR

## 2022-04-27 MED ORDER — PREDNISONE 10 MG PO TABS: 10.0000 mg | ORAL_TABLET | Freq: Every day | ORAL | 0 refills | Status: DC

## 2022-04-27 MED ORDER — CYCLOBENZAPRINE HCL 10 MG PO TABS: 10.0000 mg | ORAL_TABLET | Freq: Three times a day (TID) | ORAL | 0 refills | Status: DC | PRN

## 2022-04-27 NOTE — Progress Notes (Signed)
BP (!) 150/58   Pulse 83   Temp 98.2 F (36.8 C) (Oral)   Wt 190 lb 4.8 oz (86.3 kg)   SpO2 97%   BMI 26.17 kg/m    Subjective:    Patient ID: Martin Garcia, male    DOB: [birth year], 37 y.o.   MRN: [id number]  HPI: [name] is a 37 y.o. male  Chief Complaint  Patient presents with   Back Pain    Pt states he is still having a lot of back pain. States he mainly feels the pain on his R side and does run down his leg some. States he feels the pain more when he stands and puts pressure to the area. Patient denies urinary symptoms of burning, pain, and pressure.    BACK PAIN Duration: days Mechanism of injury: unknown Location: R>L Onset: sudden Severity: 7/10 Quality: sharp Frequency: constant Radiation: R leg above the knee Aggravating factors:  standing, bending, movement, walking, and laying Alleviating factors:  tylenol and NSAIDs Status: worse Treatments attempted:  tylenol and ibuprofen  Relief with NSAIDs?: mild Nighttime pain:  yes Paresthesias / decreased sensation:  no Bowel / bladder incontinence:  no Fevers:  no Dysuria / urinary frequency:  no   Relevant past medical, surgical, family and social history reviewed and updated as indicated. Interim medical history since our last visit reviewed. Allergies and medications reviewed and updated.  Review of Systems  Genitourinary:  Negative for dysuria.  Musculoskeletal:  Positive for back pain.    Per HPI unless specifically indicated above     Objective:    BP (!) 150/58   Pulse 83   Temp 98.2 F (36.8 C) (Oral)   Wt 190 lb 4.8 oz (86.3 kg)   SpO2 97%   BMI 26.17 kg/m   Wt Readings from Last 3 Encounters:  04/27/22 190 lb 4.8 oz (86.3 kg)  04/22/22 190 lb 11.2 oz (86.5 kg)  10/26/21 180 lb 6.4 oz (81.8 kg)    Physical Exam Vitals and nursing note reviewed.  Constitutional:      General: He is not in acute distress.    Appearance: Normal appearance. He is not ill-appearing,  toxic-appearing or diaphoretic.  HENT:     Head: Normocephalic.     Right Ear: External ear normal.     Left Ear: External ear normal.     Nose: Nose normal. No congestion or rhinorrhea.     Mouth/Throat:     Mouth: Mucous membranes are moist.  Eyes:     General:        Right eye: No discharge.        Left eye: No discharge.     Extraocular Movements: Extraocular movements intact.     Conjunctiva/sclera: Conjunctivae normal.     Pupils: Pupils are equal, round, and reactive to light.  Cardiovascular:     Rate and Rhythm: Normal rate and regular rhythm.     Heart sounds: No murmur heard. Pulmonary:     Effort: Pulmonary effort is normal. No respiratory distress.     Breath sounds: Normal breath sounds. No wheezing, rhonchi or rales.  Abdominal:     General: Abdomen is flat. Bowel sounds are normal.  Musculoskeletal:     Cervical back: Normal range of motion and neck supple.     Comments: Patient not able to sit putting pressure on right side.  Skin:    General: Skin is warm and dry.     Capillary Refill:  Capillary refill takes less than 2 seconds.  Neurological:     General: No focal deficit present.     Mental Status: He is alert and oriented to person, place, and time.  Psychiatric:        Mood and Affect: Mood normal.        Behavior: Behavior normal.        Thought Content: Thought content normal.        Judgment: Judgment normal.     Results for orders placed or performed in visit on 04/22/22  Urinalysis, Routine w reflex microscopic  Result Value Ref Range   Specific Gravity, UA <1.005 (L) 1.005 - 1.030   pH, UA 6.5 5.0 - 7.5   Color, UA Yellow Yellow   Appearance Ur Clear Clear   Leukocytes,UA Negative Negative   Protein,UA Negative Negative/Trace   Glucose, UA Negative Negative   Ketones, UA Negative Negative   RBC, UA Negative Negative   Bilirubin, UA Negative Negative   Urobilinogen, Ur 0.2 0.2 - 1.0 mg/dL   Nitrite, UA Negative Negative   Microscopic  Examination Comment       Assessment & Plan:   Problem List Items Addressed This Visit   None Visit Diagnoses     Acute right-sided low back pain with right-sided sciatica    -  Primary   Ongoing x 1 week.  Will give Kenalog in office today. Start prednisone taper tomorrow. Flexeril given. Continue with Ibuprofen. FU if not improved, may need to see Ortho. Letter written to excuse patient from work.    Relevant Medications   predniSONE (DELTASONE) 10 MG tablet   cyclobenzaprine (FLEXERIL) 10 MG tablet   triamcinolone acetonide (KENALOG-40) injection 40 mg (Start on 04/27/2022  2:00 PM)        Follow up plan: Return if symptoms worsen or fail to improve.

## 2022-07-17 ENCOUNTER — Other Ambulatory Visit: Payer: Self-pay | Admitting: Nurse Practitioner

## 2022-07-17 DIAGNOSIS — I10 Essential (primary) hypertension: Secondary | ICD-10-CM

## 2022-07-18 NOTE — Telephone Encounter (Signed)
Requested Prescriptions  Pending Prescriptions Disp Refills   lisinopril (ZESTRIL) 20 MG tablet [Pharmacy Med Name: LISINOPRIL 20 MG TABLET] 90 tablet 0    Sig: TAKE 1 TABLET BY MOUTH EVERY DAY     Cardiovascular:  ACE Inhibitors Failed - 07/17/2022  1:39 PM      Failed - Cr in normal range and within 180 days    Creatinine  Date Value Ref Range Status  06/13/2014 0.73 mg/dL Final    Comment:    1.61-0.96 NOTE: New Reference Range  04/22/14    Creatinine, Ser  Date Value Ref Range Status  08/02/2021 0.87 0.76 - 1.27 mg/dL Final         Failed - K in normal range and within 180 days    Potassium  Date Value Ref Range Status  08/02/2021 4.7 3.5 - 5.2 mmol/L Final  06/13/2014 3.7 mmol/L Final    Comment:    3.5-5.1 NOTE: New Reference Range  04/22/14          Failed - Last BP in normal range    BP Readings from Last 1 Encounters:  04/27/22 (!) 150/58         Passed - Patient is not pregnant      Passed - Valid encounter within last 6 months    Recent Outpatient Visits           2 months ago Acute right-sided low back pain with right-sided sciatica   [place] [facility] Larae Grooms, NP   2 months ago Flank pain   [place] [address] [facility] Mecum, Oswaldo Conroy, PA-C   8 months ago Essential hypertension   [facility] Gabriel Cirri, NP   11 months ago Annual physical exam   [place] [facility] Mecum, Oswaldo Conroy, PA-C   1 year ago Lower abdominal pain   [facility] [place], Jake Church, NP

## 2022-08-29 ENCOUNTER — Ambulatory Visit: Payer: MEDICAID | Admitting: Nurse Practitioner

## 2022-08-29 VITALS — BP 152/90 | HR 98 | Temp 98.5°F | Ht 71.5 in | Wt 181.8 lb

## 2022-08-29 DIAGNOSIS — I10 Essential (primary) hypertension: Secondary | ICD-10-CM

## 2022-08-29 DIAGNOSIS — T675XXA Heat exhaustion, unspecified, initial encounter: Secondary | ICD-10-CM | POA: Diagnosis not present

## 2022-08-29 MED ORDER — LISINOPRIL 40 MG PO TABS: 40.0000 mg | ORAL_TABLET | Freq: Every day | ORAL | 4 refills | Status: DC

## 2022-08-29 NOTE — Assessment & Plan Note (Signed)
Suspect heat exhaustion presented last week, now improved with increase hydration and electrolytes.  Possibly exacerbated by BP above goal, hypertension uncontrolled -- refer to hypertension plan of care for changes.  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  Overall stable exam today, obtained labs.

## 2022-08-29 NOTE — Patient Instructions (Signed)
Preventing Heat Exhaustion, Adult Heat exhaustion happens when your body gets too hot (overheated) from hot weather, exercise, strenuous physical activity, dehydration, or a combination of these. If untreated, heat exhaustion could lead to heat stroke, which is a medical emergency and can be life-threatening. How can heat exhaustion affect me? Early warning signs of heat exhaustion include: Weakness. Fatigue. Heat rash. This red, blotchy skin rash is also called prickly heat. Swelling of the legs and feet. Stomach cramps. Arm pain. Leg cramps. When heat exhaustion symptoms progress, they can include: Heavy sweating. Body temperature of 102.38F (39C) or higher. Clammy skin. Rapid, weak pulse. Nausea or vomiting. Dizziness. Headache. Difficulty focusing or concentrating. Fainting. If you have signs of heat exhaustion, move to a cool place, loosen your clothing, and drink water or a sports drink. Then do these things: Put cool, wet compresses on your body or get into a cool bath or shower. Remove all unnecessary clothing to expose as much skin as possible to ventilated air. Fans and cool water mists over your skin are very important. What can increase my risk? People who work or exercise outside in hot weather have the highest risk of heat exhaustion. You may also be at higher risk if you: Are over age 62. Older adults have a greater risk for heat exhaustion than younger adults. Live alone, especially in a home without air conditioning or proper ventilation. Are very overweight (obese). Have high blood pressure. Have certain chronic medical conditions, such as heart disease, poor circulation or other vascular diseases, sickle cell disease, high blood pressure, diabetes, lung disease, or cystic fibrosis, or you have had a stroke. Take certain medicines for high blood pressure, or you take antihistamines or tranquilizers. Drink alcohol excessively or use drugs, such as cocaine, heroin, or  amphetamines. What actions can I take to prevent heat exhaustion? Eating and drinking     Drink enough water or sports drinks to keep your urine pale yellow. When it is hot, drink every 15 to 20 minutes, even if you are not thirsty. Do not go out in the heat after a heavy meal. Do not drink alcohol or caffeinated drinks when it is very hot outside. Lifestyle Avoid being outside on very hot days. Check your local news for extreme heat alerts or warnings. In extreme heat, stay in an air-conditioned environment until the temperature cools off. Wear lightweight, light-colored, and loose-fitting clothing in warm weather. This allows for better air circulation over the skin. Protect yourself from the sun by wearing a broad-brimmed hat and using a broad-spectrum sunscreen that is SPF 15 or higher. Activity Check with your health care provider before starting any new exercise or activity. Ask about any health conditions or medicines that might increase your risk for heat exhaustion. Do outdoor activities when it is cooler. This may be in the morning, late afternoon, or evening. Take breaks in the shade. Do not work or exercise in the heat when you feel unwell or have been sick. Start any new work or exercise activity gradually. General information Older adults need extra precautions to protect against heat exhaustion. Follow these tips: Do not leave an older adult alone in a hot car. Make sure older adults have access to air conditioning on very hot days. Remind them to drink enough fluids. Check on them at least twice a day if you can. Where to find more information Centers for Disease Control and Prevention: FootballExhibition.com.br American Academy of Family Physicians: familydoctor.org American Academy of Orthopedic Surgeons: orthoinfo.aaos.org  Contact a health care provider if you: Faint. Feel weak or dizzy. Have any signs or symptoms of heat exhaustion that last more than 1 hour. These may include  muscle cramps, fatigue, redness of the face and neck, and headache. Get help right away if you: Have signs of heat stroke, including: Body temperature of 104F (40C) or higher. Hot, dry, red skin. Fast, thumping pulse. Severe nausea, vomiting, or both. Confusion. These symptoms may represent a serious problem that is an emergency. Do not wait to see if the symptoms will go away. Get medical help right away. Call your local emergency services (911 in the [place] Do not drive yourself to the hospital. Summary Heat exhaustion happens when your body gets too hot (overheated) from hot weather, exercise, strenuous physical activity, dehydration, or a combination of these. Avoid being outside on very hot days. When you are out in the heat, stay hydrated, wear light clothing, and take breaks in shaded areas to cool down. If you have signs of heat exhaustion, get out of the heat, drink fluids, take steps to cool down, and contact a health care provider. Cooling down may include taking off all unnecessary clothing, using cooling fans, and water misting. Make sure you are drinking fluids, such as water or sports drinks with electrolytes. Contact a health care provider if you have signs or symptoms of heat exhaustion that last more than 1 hour, such as muscle cramps, fatigue, and headache. This information is not intended to replace advice given to you by your health care provider. Make sure you discuss any questions you have with your health care provider. Document Revised: 07/29/2019 Document Reviewed: 07/29/2019 Elsevier Patient Education  2024 [employer].

## 2022-08-29 NOTE — Assessment & Plan Note (Signed)
Chronic, ongoing with elevations today above goal in office and at home.  Will increase Lisinopril to 40 MG daily, can finish out 20 MG tablets by taking two a day -- discussed with patient and educated on change.  Recommend he cut back on beer intake + vaping.  Recommend he monitor BP at least a few mornings a week at home and document.  DASH diet at home.  Labs today: CBC, CMP, TSH.  Return in 4 weeks.

## 2022-08-29 NOTE — Progress Notes (Signed)
BP (!) 152/90 (BP Location: Left Arm, Patient Position: Sitting, Cuff Size: Normal)   Pulse 98   Temp 98.5 F (36.9 C) (Oral)   Ht 5' 11.5" (1.816 m)   Wt 181 lb 12.8 oz (82.5 kg)   SpO2 99%   BMI 25.01 kg/m    Subjective:    Patient ID: Martin Garcia, male    DOB: 1986-02-05, 37 y.o.   MRN: [id number]  HPI: Martin Garcia is a 37 y.o. 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 Was feeling dizzy and felt that his heart was pounding, Started to feel better yesterday but now needs a note to return to work.    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  On Wednesday evening had episode of feeling like heart was pounding and dizziness -- took of Thursday.  On Friday/Saturday continued to feel bad (was off on these days).  Did not work Sunday as they wanted him to see provider. Started to feel better yesterday and today after drinking Pedialyte.  Is taking Lisinopril 20 MG daily -- does not check often at home, but checked this morning 154/93.  Overall symptoms improved yesterday and today.  Quit smoking 3 years ago, does vape nicotine.  Does endorse a couple beers daily.   Duration: days Description of symptoms: lightheaded Duration of episode: hours -- nausea and headache lasted a good hour Dizziness frequency: no history of the same Provoking factors: heat  Aggravating factors:   heat Triggered by rolling over in bed: no Triggered by bending over: no Aggravated by head movement: no Aggravated by exertion, coughing, loud noises: no Recent head injury: no Recent or current viral symptoms: no History of vasovagal episodes: no Nausea: yes Vomiting: no Tinnitus:  a little bit during episodes Hearing loss: no Aural fullness: no Headache:  when was having the episodes Photophobia/phonophobia: no Unsteady gait:  when having the  episodes noted above, improved Postural instability: no Pallor: no Diaphoresis: no Dyspnea: no Chest pain: no -- was having heart beat fast   Relevant past medical, surgical, family and social history reviewed and updated as indicated. Interim medical history since our last visit reviewed. Allergies and medications reviewed and updated.  Review of Systems  Constitutional:  Negative for activity change, diaphoresis, fatigue and fever.  Respiratory:  Negative for cough, chest tightness, shortness of breath and wheezing.   Cardiovascular:  Negative for chest pain, palpitations and leg swelling.  Gastrointestinal:  Positive for nausea. Negative for abdominal distention, abdominal pain and vomiting.  Endocrine: Negative for cold intolerance and heat intolerance.  Neurological:  Positive for dizziness, light-headedness and headaches. Negative for syncope, facial asymmetry, speech difficulty, weakness and numbness.  Psychiatric/Behavioral: Negative.      Per HPI unless specifically indicated above     Objective:    BP (!) 152/90 (BP Location: Left Arm, Patient Position: Sitting, Cuff Size: Normal)   Pulse 98   Temp 98.5 F (36.9 C) (Oral)   Ht 5' 11.5" (1.816 m)   Wt 181 lb 12.8 oz (82.5 kg)   SpO2 99%   BMI 25.01 kg/m   Wt Readings from Last 3 Encounters:  08/29/22 181 lb 12.8 oz (82.5 kg)  04/27/22 190 lb 4.8 oz (86.3 kg)  04/22/22 190 lb 11.2 oz (86.5 kg)    Physical Exam Vitals and  nursing note reviewed.  Constitutional:      General: He is awake. He is not in acute distress.    Appearance: He is well-developed and well-groomed. He is not ill-appearing or toxic-appearing.  HENT:     Head: Normocephalic.     Right Ear: Hearing and external ear normal.     Left Ear: Hearing and external ear normal.  Eyes:     General: Lids are normal.     Extraocular Movements: Extraocular movements intact.     Conjunctiva/sclera: Conjunctivae normal.  Neck:     Thyroid: No  thyromegaly.     Vascular: No carotid bruit.  Cardiovascular:     Rate and Rhythm: Normal rate and regular rhythm.     Heart sounds: Normal heart sounds. No murmur heard.    No gallop.  Pulmonary:     Effort: Pulmonary effort is normal. No accessory muscle usage or respiratory distress.     Breath sounds: Normal breath sounds.  Abdominal:     General: Bowel sounds are normal. There is no distension.     Palpations: Abdomen is soft.     Tenderness: There is no abdominal tenderness.  Musculoskeletal:     Cervical back: Full passive range of motion without pain.     Right lower leg: No edema.     Left lower leg: No edema.  Lymphadenopathy:     Cervical: No cervical adenopathy.  Skin:    General: Skin is warm.     Capillary Refill: Capillary refill takes less than 2 seconds.  Neurological:     Mental Status: He is alert and oriented to person, place, and time.     Cranial Nerves: Cranial nerves 2-12 are intact.     Motor: Motor function is intact.     Coordination: Coordination is intact.     Gait: Gait is intact.     Deep Tendon Reflexes: Reflexes are normal and symmetric.     Reflex Scores:      Brachioradialis reflexes are 2+ on the right side and 2+ on the left side.      Patellar reflexes are 2+ on the right side and 2+ on the left side. Psychiatric:        Attention and Perception: Attention normal.        Mood and Affect: Mood normal.        Speech: Speech normal.        Behavior: Behavior normal. Behavior is cooperative.        Thought Content: Thought content normal.    Results for orders placed or performed in visit on 04/22/22  Urinalysis, Routine w reflex microscopic  Result Value Ref Range   Specific Gravity, UA <1.005 (L) 1.005 - 1.030   pH, UA 6.5 5.0 - 7.5   Color, UA Yellow Yellow   Appearance Ur Clear Clear   Leukocytes,UA Negative Negative   Protein,UA Negative Negative/Trace   Glucose, UA Negative Negative   Ketones, UA Negative Negative   RBC, UA  Negative Negative   Bilirubin, UA Negative Negative   Urobilinogen, Ur 0.2 0.2 - 1.0 mg/dL   Nitrite, UA Negative Negative   Microscopic Examination Comment       Assessment & Plan:   Problem List Items Addressed This Visit       Cardiovascular and Mediastinum   Essential hypertension - Primary    Chronic, ongoing with elevations today above goal in office and at home.  Will increase Lisinopril to 40 MG daily, can  finish out 20 MG tablets by taking two a day -- discussed with patient and educated on change.  Recommend he cut back on beer intake + vaping.  Recommend he monitor BP at least a few mornings a week at home and document.  DASH diet at home.  Labs today: CBC, CMP, TSH.  Return in 4 weeks.        Relevant Medications   lisinopril (ZESTRIL) 40 MG tablet   Other Relevant Orders   CBC with Differential/Platelet   Comprehensive metabolic panel   TSH     Other   Heat exhaustion    Suspect heat exhaustion presented last week, now improved with increase hydration and electrolytes.  Possibly exacerbated by BP above goal, hypertension uncontrolled -- refer to hypertension plan of care for changes.  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  Overall stable exam today, obtained labs.        Follow up plan: Return in about 4 weeks (around 09/26/2022) for HTN -- increased Lisinopril to 40 MG.

## 2022-08-30 LAB — CBC WITH DIFFERENTIAL/PLATELET
Basophils Absolute: 0.1 10*3/uL (ref 0.0–0.2)
Basos: 1 %
EOS (ABSOLUTE): 0 10*3/uL (ref 0.0–0.4)
Eos: 0 %
Hematocrit: 43.5 % (ref 37.5–51.0)
Hemoglobin: 14.5 g/dL (ref 13.0–17.7)
Immature Grans (Abs): 0 10*3/uL (ref 0.0–0.1)
Immature Granulocytes: 0 %
Lymphocytes Absolute: 2 10*3/uL (ref 0.7–3.1)
Lymphs: 22 %
MCH: 30.8 pg (ref 26.6–33.0)
MCHC: 33.3 g/dL (ref 31.5–35.7)
MCV: 92 fL (ref 79–97)
Monocytes Absolute: 0.6 10*3/uL (ref 0.1–0.9)
Monocytes: 7 %
Neutrophils Absolute: 6.3 10*3/uL (ref 1.4–7.0)
Neutrophils: 70 %
Platelets: 371 10*3/uL (ref 150–450)
RBC: 4.71 x10E6/uL (ref 4.14–5.80)
RDW: 12.2 % (ref 11.6–15.4)
WBC: 9 10*3/uL (ref 3.4–10.8)

## 2022-08-30 LAB — COMPREHENSIVE METABOLIC PANEL
ALT: 32 IU/L (ref 0–44)
AST: 24 IU/L (ref 0–40)
Albumin: 4.9 g/dL (ref 4.1–5.1)
Alkaline Phosphatase: 77 IU/L (ref 44–121)
BUN/Creatinine Ratio: 9 (ref 9–20)
BUN: 8 mg/dL (ref 6–20)
Bilirubin Total: 0.3 mg/dL (ref 0.0–1.2)
CO2: 24 mmol/L (ref 20–29)
Calcium: 10.2 mg/dL (ref 8.7–10.2)
Chloride: 96 mmol/L (ref 96–106)
Creatinine, Ser: 0.88 mg/dL (ref 0.76–1.27)
Globulin, Total: 3 g/dL (ref 1.5–4.5)
Glucose: 111 mg/dL — ABNORMAL HIGH (ref 70–99)
Potassium: 4.5 mmol/L (ref 3.5–5.2)
Sodium: 138 mmol/L (ref 134–144)
Total Protein: 7.9 g/dL (ref 6.0–8.5)
eGFR: 113 mL/min/{1.73_m2} (ref >59)

## 2022-08-30 LAB — TSH: TSH: 1.15 u[IU]/mL (ref 0.450–4.500)

## 2022-08-30 NOTE — Progress Notes (Signed)
Contacted via MyChart   Good morning Mahlik, your labs have returned and overall these look stable.  No major concerns on these.  Glucose very mild elevation, but if just ate that would explain this.  Any questions?  Continue to hydrate well!! Keep being amazing!!  Thank you for allowing me to participate in your care.  I appreciate you. Kindest regards, Dynasti Kerman

## 2022-09-27 ENCOUNTER — Ambulatory Visit: Payer: MEDICAID | Admitting: Nurse Practitioner

## 2022-09-27 DIAGNOSIS — I10 Essential (primary) hypertension: Secondary | ICD-10-CM

## 2023-03-08 ENCOUNTER — Other Ambulatory Visit: Payer: Self-pay | Admitting: Nurse Practitioner

## 2023-03-08 DIAGNOSIS — I10 Essential (primary) hypertension: Secondary | ICD-10-CM

## 2023-03-08 NOTE — Telephone Encounter (Signed)
  The original prescription was discontinued on 08/29/2022 by Marjie Skiff, NP    Requested Prescriptions  Pending Prescriptions Disp Refills   lisinopril (ZESTRIL) 20 MG tablet [Pharmacy Med Name: LISINOPRIL 20 MG TABLET] 90 tablet 0    Sig: TAKE 1 TABLET BY MOUTH EVERY DAY     Cardiovascular:  ACE Inhibitors Failed - 03/08/2023  3:22 PM      Failed - Cr in normal range and within 180 days    Creatinine  Date Value Ref Range Status  06/13/2014 0.73 mg/dL Final    Comment:    5.62-1.30 NOTE: New Reference Range  04/22/14    Creatinine, Ser  Date Value Ref Range Status  08/29/2022 0.88 0.76 - 1.27 mg/dL Final         Failed - K in normal range and within 180 days    Potassium  Date Value Ref Range Status  08/29/2022 4.5 3.5 - 5.2 mmol/L Final  06/13/2014 3.7 mmol/L Final    Comment:    3.5-5.1 NOTE: New Reference Range  04/22/14          Failed - Last BP in normal range    BP Readings from Last 1 Encounters:  08/29/22 (!) 152/90         Failed - Valid encounter within last 6 months    Recent Outpatient Visits           6 months ago Essential hypertension   [place] [facility] [place], Corrie Dandy T, NP   10 months ago Acute right-sided low back pain with right-sided sciatica   [employer] [facility] Larae Grooms, NP   10 months ago Flank pain   [place] [address] [facility] Mecum, Oswaldo Conroy, PA-C   1 year ago Essential hypertension   [place] [facility] Gabriel Cirri, NP   1 year ago Annual physical exam    [facility] Mecum, Oswaldo Conroy, PA-C              Passed - Patient is not pregnant

## 2023-07-26 ENCOUNTER — Other Ambulatory Visit: Payer: Self-pay

## 2023-07-26 ENCOUNTER — Emergency Department: Payer: MEDICAID

## 2023-07-26 ENCOUNTER — Emergency Department
Admission: EM | Admit: 2023-07-26 | Discharge: 2023-07-26 | Disposition: A | Discharge status: Home / Self Care | Payer: MEDICAID | Attending: Emergency Medicine | Admitting: Emergency Medicine

## 2023-07-26 DIAGNOSIS — F10920 Alcohol use, unspecified with intoxication, uncomplicated: Secondary | ICD-10-CM

## 2023-07-26 DIAGNOSIS — R451 Restlessness and agitation: Secondary | ICD-10-CM | POA: Insufficient documentation

## 2023-07-26 DIAGNOSIS — Y908 Blood alcohol level of 240 mg/100 ml or more: Secondary | ICD-10-CM | POA: Insufficient documentation

## 2023-07-26 DIAGNOSIS — F10129 Alcohol abuse with intoxication, unspecified: Secondary | ICD-10-CM | POA: Insufficient documentation

## 2023-07-26 DIAGNOSIS — F10930 Alcohol use, unspecified with withdrawal, uncomplicated: Secondary | ICD-10-CM

## 2023-07-26 DIAGNOSIS — R079 Chest pain, unspecified: Secondary | ICD-10-CM

## 2023-07-26 LAB — COMPREHENSIVE METABOLIC PANEL WITH GFR
ALT: 20 U/L (ref 0–44)
AST: 32 U/L (ref 15–41)
Albumin: 4.4 g/dL (ref 3.5–5.0)
Alkaline Phosphatase: 64 U/L (ref 38–126)
Anion gap: 12 (ref 5–15)
BUN: 6 mg/dL (ref 6–20)
CO2: 26 mmol/L (ref 22–32)
Calcium: 9.2 mg/dL (ref 8.9–10.3)
Chloride: 103 mmol/L (ref 98–111)
Creatinine, Ser: 0.72 mg/dL (ref 0.61–1.24)
GFR, Estimated: >60 mL/min
Glucose, Bld: 132 mg/dL — ABNORMAL HIGH (ref 70–99)
Potassium: 3.5 mmol/L (ref 3.5–5.1)
Sodium: 141 mmol/L (ref 135–145)
Total Bilirubin: 0.5 mg/dL (ref 0.0–1.2)
Total Protein: 8 g/dL (ref 6.5–8.1)

## 2023-07-26 LAB — CBC
HCT: 40.1 % (ref 39.0–52.0)
Hemoglobin: 14.4 g/dL (ref 13.0–17.0)
MCH: 31.4 pg (ref 26.0–34.0)
MCHC: 35.9 g/dL (ref 30.0–36.0)
MCV: 87.6 fL (ref 80.0–100.0)
Platelets: 338 10*3/uL (ref 150–400)
RBC: 4.58 MIL/uL (ref 4.22–5.81)
RDW: 12.6 % (ref 11.5–15.5)
WBC: 9.6 10*3/uL (ref 4.0–10.5)
nRBC: 0 % (ref 0.0–0.2)

## 2023-07-26 LAB — LIPASE, BLOOD: Lipase: 46 U/L (ref 11–51)

## 2023-07-26 LAB — TROPONIN I (HIGH SENSITIVITY): Troponin I (High Sensitivity): 4 ng/L

## 2023-07-26 LAB — ETHANOL: Alcohol, Ethyl (B): 336 mg/dL (ref <15)

## 2023-07-26 MED ORDER — LORAZEPAM 2 MG/ML IJ SOLN
1.0000 mg | Freq: Once | INTRAMUSCULAR | Status: AC
  Administered 2023-07-26: 1 mg via INTRAVENOUS
  Filled 2023-07-26: qty 1

## 2023-07-26 MED ORDER — GABAPENTIN 600 MG PO TABS: 600.0000 mg | ORAL_TABLET | Freq: Three times a day (TID) | ORAL | 0 refills | Status: DC

## 2023-07-26 MED ORDER — ONDANSETRON HCL 4 MG/2ML IJ SOLN
4.0000 mg | Freq: Once | INTRAMUSCULAR | Status: AC
  Administered 2023-07-26: 4 mg via INTRAVENOUS
  Filled 2023-07-26: qty 2

## 2023-07-26 NOTE — ED Notes (Signed)
TTS with pt now. 

## 2023-07-26 NOTE — ED Notes (Signed)
Snack given to pt at this time

## 2023-07-26 NOTE — ED Notes (Signed)
 Received pt from jessie rn. Pt cooperative.  Pt denies si or hi.  Pt in hallway bed.  Pt waiting on tts consult.

## 2023-07-26 NOTE — ED Notes (Signed)
VOL  PENDING TTS

## 2023-07-26 NOTE — ED Notes (Signed)
 Pt family member at bedside giving pt water.

## 2023-07-26 NOTE — ED Notes (Signed)
 Pt asking if he was able to get a second dose of Ativan . Pt felt like symptoms were coming back. Provider notified.

## 2023-07-26 NOTE — Discharge Instructions (Addendum)
 Your evaluation in the emergency department was overall reassuring.  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  Please do go to the detox facility as planned and return to the emergency department with any new or worsening symptoms.

## 2023-07-26 NOTE — ED Notes (Signed)
 Pt currently yelling at this nurse to get him water. Pt is still currently vomiting into emesis bag.

## 2023-07-26 NOTE — ED Triage Notes (Signed)
 Pt comes in via pov with complaints of cp and sob the past couple of days. Pt states that he has been trying to stop drinking the past couple of days. Pt states that he usually drinks about a fifth of liquor a day. Pt has a history of seizures with withdrawals. Pt states that his last drink was on the way here. Cp eased up for the pt after having a drink on the way here. Pt believes that he might of had a seizure last night, and has bruising to his tongue. Pt tachycardic in triage, with no signs of acute distress at this time.

## 2023-07-26 NOTE — ED Notes (Signed)
Pt given dinner meal 

## 2023-07-26 NOTE — ED Notes (Signed)
 Pt and family requesting water for the pt. This RN informed pt that he cannot have anything to drink until the MD sees the pt. Pt adamant about getting something to drink. Pt family left from bedside to get pt water. RN continues to inform pt and family that the MD should see them first.

## 2023-07-26 NOTE — BH Assessment (Signed)
 This Clinical research associate provided patient with Substance Use Treatment resources that include:  Detox Treatment [facility] ([place]) [facility] ([place], [place]) [facility] ([place], [place]) [facility] ([place], [place]) [facility] ([place], [place]) [facility] ([place])  Residential Treatment (30 days) [facility] ([place])  [employer] (Outpatient) [facility] [place], [place]) [facility] ([place], [place]) [facility] ([place], [place]) [facility] ([place]) [facility]  Patient was receptive of the resources given

## 2023-07-26 NOTE — ED Provider Notes (Signed)
 [facility] Provider Note    Event Date/Time   First MD Initiated Contact with Patient 07/26/23 1335     (approximate)   History   Chest Pain   HPI  Martin Garcia is a 38 y.o. male past medical history significant for alcohol abuse who presents to the emergency department with intoxication.  States that he is here because he wants rehab.  States that he has been trying to quit drinking and he believes that he had a seizure overnight.  Drank alcohol prior to arrival.  Multiple episodes of nausea and vomiting while in the ER.  Denies any significant chest pain or shortness of breath.  States that he is interested in rehab.  Denies any SI or HI.  Is here with his fiance.     Physical Exam   Triage Vital Signs: ED Triage Vitals  Encounter Vitals Group     BP 07/26/23 1309 120/65     Systolic BP Percentile --      Diastolic BP Percentile --      Pulse Rate 07/26/23 1309 (!) 131     Resp 07/26/23 1417 20     Temp 07/26/23 1309 99 F (37.2 C)     Temp src --      SpO2 07/26/23 1309 99 %     Weight 07/26/23 1308 181 lb 14.1 oz (82.5 kg)     Height 07/26/23 1308 5' 11 (1.803 m)     Head Circumference --      Peak Flow --      Pain Score 07/26/23 1307 8     Pain Loc --      Pain Education --      Exclude from Growth Chart --     Most recent vital signs: Vitals:   07/26/23 1411 07/26/23 1417  BP: (!) 146/109   Pulse: (!) 106   Resp:  20  Temp:    SpO2:      Physical Exam Constitutional:      Appearance: He is well-developed.  HENT:     Head: Atraumatic.  Eyes:     Conjunctiva/sclera: Conjunctivae normal.  Cardiovascular:     Rate and Rhythm: Regular rhythm.  Pulmonary:     Effort: No respiratory distress.  Musculoskeletal:     Cervical back: Normal range of motion.  Skin:    General: Skin is warm.  Neurological:     Mental Status: He is alert. Mental status is at baseline.  Psychiatric:        Behavior: Behavior is agitated.         Thought Content: Thought content does not include homicidal or suicidal ideation. Thought content does not include homicidal or suicidal plan.     IMPRESSION / MDM / ASSESSMENT AND PLAN / ED COURSE  I reviewed the triage vital signs and the nursing notes.  Differential diagnosis including alcohol intoxication, ACS, anemia, dehydration, electrolyte abnormality, pancreatitis  EKG  I, Viviano Ground, the attending physician, personally viewed and interpreted this ECG.  Sinus tachycardia with heart rate of 119.  Normal intervals.  No chamber enlargement.  No significant ST elevation or depression. No tachycardic or bradycardic dysrhythmias while on cardiac telemetry.  RADIOLOGY I independently reviewed imaging, my interpretation of imaging: Chest x-ray with no widened mediastinum, no pneumothorax and no pneumonia  LABS (all labs ordered are listed, but only abnormal results are displayed) Labs interpreted as -    Labs Reviewed  ETHANOL - Abnormal; Notable  for the following components:      Result Value   Alcohol, Ethyl (B) 336 (*)    All other components within normal limits  COMPREHENSIVE METABOLIC PANEL WITH GFR - Abnormal; Notable for the following components:   Glucose, Bld 132 (*)    All other components within normal limits  CBC  LIPASE, BLOOD  TROPONIN I (HIGH SENSITIVITY)     MDM  Alcohol level of 330.  No significant electrolyte abnormality.  No significant leukocytosis.  Initial troponin is negative have low suspicion for ACS.  Given IV Zofran  for nausea and vomiting while in the emergency department.  Consulted TOC/social work for further evaluation of possible rehab.  Patient is here voluntarily.     PROCEDURES:  Critical Care performed: No  Procedures  Patient's presentation is most consistent with acute presentation with potential threat to life or bodily function.   MEDICATIONS ORDERED IN ED: Medications  ondansetron  (ZOFRAN ) injection 4 mg (4  mg Intravenous Given 07/26/23 1427)    FINAL CLINICAL IMPRESSION(S) / ED DIAGNOSES   Final diagnoses:  None     Rx / DC Orders   ED Discharge Orders     None        Note:  This document was prepared using Dragon voice recognition software and may include unintentional dictation errors.   Viviano Ground, MD 07/26/23 1547
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
## 2023-07-26 NOTE — ED Notes (Signed)
 Pt ABCs intact. RR even and unlabored. Pt appearing anxious. Bed in lowest locked position.

## 2023-07-26 NOTE — ED Notes (Addendum)
 MD at bedside. Pt not interrupting MD and continues to get agitated with MD. Pt continues to cuss and state I am not that pt. Pt continues to be agitated with staff.

## 2023-07-26 NOTE — ED Notes (Signed)
 Pt discharged to ED circle at this time and left with all belongings. Pt ABCs intact. RR even and unlabored. Pt in NAD. Pt denies further needs from this RN.
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
## 2023-07-26 NOTE — ED Notes (Signed)
 Pt currently vomiting at bedside.

## 2023-09-04 ENCOUNTER — Other Ambulatory Visit: Payer: Self-pay | Admitting: Nurse Practitioner

## 2023-09-05 NOTE — Telephone Encounter (Signed)
 Patient has not been seen in a year. Please call and schedule the patient an appointment and then route to provider for possible refill.

## 2023-09-05 NOTE — Telephone Encounter (Signed)
 Requested medications are due for refill today.  yes  Requested medications are on the active medications list.  yes  Last refill. 08/29/2022 #90 4 rf  Future visit scheduled.   no  Notes to clinic.  Pt last seen 08/29/2022    Requested Prescriptions  Pending Prescriptions Disp Refills   lisinopril  (ZESTRIL ) 40 MG tablet [Pharmacy Med Name: LISINOPRIL  40 MG TABLET] 90 tablet 4    Sig: TAKE 1 TABLET BY MOUTH EVERY DAY     Cardiovascular:  ACE Inhibitors Failed - 09/05/2023  4:06 PM      Failed - Last BP in normal range    BP Readings from Last 1 Encounters:  07/26/23 (!) 156/108         Failed - Valid encounter within last 6 months    Recent Outpatient Visits   None            Passed - Cr in normal range and within 180 days    Creatinine  Date Value Ref Range Status  06/13/2014 0.73 mg/dL Final    Comment:    9.38-8.75 NOTE: New Reference Range  04/22/14    Creatinine, Ser  Date Value Ref Range Status  07/26/2023 0.72 0.61 - 1.24 mg/dL Final         Passed - K in normal range and within 180 days    Potassium  Date Value Ref Range Status  07/26/2023 3.5 3.5 - 5.1 mmol/L Final  06/13/2014 3.7 mmol/L Final    Comment:    3.5-5.1 NOTE: New Reference Range  04/22/14          Passed - Patient is not pregnant

## 2023-09-06 NOTE — Telephone Encounter (Signed)
 Called patient to get him scheduled but the number on file is not a working number, could not leave a message.

## 2023-09-08 NOTE — Telephone Encounter (Signed)
 2 attempt to reach patient. Called patient to get him scheduled but the number on file is not a working number, could not leave a message.

## 2023-09-13 ENCOUNTER — Other Ambulatory Visit: Payer: Self-pay | Admitting: Nurse Practitioner

## 2023-09-13 MED ORDER — LISINOPRIL 40 MG PO TABS
40.0000 mg | ORAL_TABLET | Freq: Every day | ORAL | 0 refills | Status: DC
Start: 2023-09-13 — End: 2023-10-25

## 2023-09-13 NOTE — Telephone Encounter (Signed)
 Copied from CRM [id number]. Topic: Clinical - Medication Refill >> Sep 13, 2023 10:54 AM Tobias L wrote: Medication:  lisinopril  (ZESTRIL ) 40 MG tablet Patient scheduled follow up for 10/11/23, requesting courtesy refill until able to be seen.   Has the patient contacted their pharmacy? Yes Told to contact office for further refills.   This is the patient's preferred pharmacy:  [id number] - [place], [place] - [address] [place] [address] Phone: [phone] Fax: [phone]  Is this the correct pharmacy for this prescription? Yes Has the prescription been filled recently? No  Is the patient out of the medication? Yes  Has the patient been seen for an appointment in the last year OR does the patient have an upcoming appointment? Yes  Can we respond through MyChart? No  Agent: Please be advised that Rx refills may take up to 3 business days. We ask that you follow-up with your pharmacy.

## 2023-10-08 ENCOUNTER — Other Ambulatory Visit: Payer: Self-pay | Admitting: Nurse Practitioner

## 2023-10-09 NOTE — Telephone Encounter (Signed)
 Requested medications are due for refill today.  A few days early  Requested medications are on the active medications list.  yes  Last refill. 09/13/2023 #30 0 rf  Future visit scheduled.   Yes - in 2 days  Notes to clinic.  I would only be able to refill this for 2 pills per our protocol.    Requested Prescriptions  Pending Prescriptions Disp Refills   lisinopril  (ZESTRIL ) 40 MG tablet [Pharmacy Med Name: LISINOPRIL  40 MG TABLET] 90 tablet 1    Sig: TAKE 1 TABLET BY MOUTH EVERY DAY     Cardiovascular:  ACE Inhibitors Failed - 10/09/2023  4:50 PM      Failed - Last BP in normal range    BP Readings from Last 1 Encounters:  07/26/23 (!) 156/108         Failed - Valid encounter within last 6 months    Recent Outpatient Visits   None            Passed - Cr in normal range and within 180 days    Creatinine  Date Value Ref Range Status  06/13/2014 0.73 mg/dL Final    Comment:    9.38-8.75 NOTE: New Reference Range  04/22/14    Creatinine, Ser  Date Value Ref Range Status  07/26/2023 0.72 0.61 - 1.24 mg/dL Final         Passed - K in normal range and within 180 days    Potassium  Date Value Ref Range Status  07/26/2023 3.5 3.5 - 5.1 mmol/L Final  06/13/2014 3.7 mmol/L Final    Comment:    3.5-5.1 NOTE: New Reference Range  04/22/14          Passed - Patient is not pregnant

## 2023-10-10 NOTE — Telephone Encounter (Signed)
 Please assist patient with scheduling needed appointment.  Thank you!

## 2023-10-11 ENCOUNTER — Ambulatory Visit: Payer: MEDICAID | Admitting: Nurse Practitioner
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
## 2023-10-19 ENCOUNTER — Telehealth: Payer: Self-pay | Admitting: Nurse Practitioner

## 2023-10-19 ENCOUNTER — Ambulatory Visit: Payer: Self-pay

## 2023-10-19 ENCOUNTER — Ambulatory Visit: Payer: MEDICAID | Admitting: Nurse Practitioner

## 2023-10-19 NOTE — Telephone Encounter (Signed)
 First attempt to contact pt, no answer, LVM for call back to PCP office. Placed in call back.   Copied from CRM [id number]. Topic: Clinical - Prescription Issue >> Oct 19, 2023 10:17 AM Kevelyn M wrote: Reason for CRM: Patient calling back and is asking for gabapentin . He's trying to stop drinking and it will help with withdrawals.

## 2023-10-19 NOTE — Telephone Encounter (Unsigned)
 Copied from CRM [id number]. Topic: Clinical - Medication Refill >> Oct 19, 2023 10:11 AM Delon T wrote: Medication: lisinopril  (ZESTRIL ) 40 MG tablet  Has the patient contacted their pharmacy? Yes (Agent: If no, request that the patient contact the pharmacy for the refill. If patient does not wish to contact the pharmacy document the reason why and proceed with request.) (Agent: If yes, when and what did the pharmacy advise?)  This is the patient's preferred pharmacy:  [id number] - [place], [place] - [address] [place] [address] Phone: [phone] Fax: [phone]  Is this the correct pharmacy for this prescription? Yes If no, delete pharmacy and type the correct one.   Has the prescription been filled recently? Yes  Is the patient out of the medication? Yes  Has the patient been seen for an appointment in the last year OR does the patient have an upcoming appointment? Yes  Can we respond through MyChart? Yes  Agent: Please be advised that Rx refills may take up to 3 business days. We ask that you follow-up with your pharmacy.

## 2023-10-19 NOTE — Telephone Encounter (Signed)
 FYI Only or Action Required?: FYI only for provider.  Patient was last seen in primary care on 08/29/2022 by Valerio Melanie DASEN, NP.  Called Nurse Triage reporting Med Change Request.  Symptoms began today.  Interventions attempted: Nothing.  Symptoms are: gradually worsening.  Triage Disposition: See PCP When Office is Open (Within 3 Days)  Patient/caregiver understands and will follow disposition?: no  Reason for Disposition  Prescription request for new medicine (not a refill)  Answer Assessment - Initial Assessment Questions 1. NAME of MEDICINE: What medicine(s) are you calling about?     Pt requesting gabapentin  for ETOH withdrawals 2. QUESTION: What is your question? (e.g., double dose of medicine, side effect)     Pt states that he was seen in the hosp recently and was given the medication and it worked. 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  4. SYMPTOMS: Do you have any symptoms? If Yes, ask: What symptoms are you having?  How bad are the symptoms (e.g., mild, moderate, severe)     Pt states that he is anxious and shaking at this time, with cold sweats. Pt states that he had a drink this morning.   Pt states that he drinks 1 pt-1/5 of liquor per day. Pt unsure if he has a SZ with a withdrawal. RN recommended ED d/t withdrawal s/s that pt is having. Pt refusing ED at this time d/t the ED doesn't do anything, they just discharge me and I have to fend for myself. Pt scheduled with PCP today, RN confirms that pt will not be driving to the clinic, pt will have ride.  Protocols used: Medication Question Call-A-AH

## 2023-10-19 NOTE — Telephone Encounter (Signed)
 2nd attempt, LVM

## 2023-10-20 ENCOUNTER — Inpatient Hospital Stay
Admission: EM | Admit: 2023-10-20 | Discharge: 2023-10-25 | DRG: 917 | Disposition: A | Discharge status: Home / Self Care | Payer: MEDICAID | Attending: Student | Admitting: Student

## 2023-10-20 ENCOUNTER — Inpatient Hospital Stay: Payer: MEDICAID

## 2023-10-20 ENCOUNTER — Other Ambulatory Visit: Payer: Self-pay

## 2023-10-20 DIAGNOSIS — R569 Unspecified convulsions: Principal | ICD-10-CM

## 2023-10-20 DIAGNOSIS — G40509 Epileptic seizures related to external causes, not intractable, without status epilepticus: Secondary | ICD-10-CM | POA: Diagnosis present

## 2023-10-20 DIAGNOSIS — G9340 Encephalopathy, unspecified: Secondary | ICD-10-CM | POA: Diagnosis not present

## 2023-10-20 DIAGNOSIS — F1120 Opioid dependence, uncomplicated: Secondary | ICD-10-CM | POA: Diagnosis present

## 2023-10-20 DIAGNOSIS — F10129 Alcohol abuse with intoxication, unspecified: Secondary | ICD-10-CM | POA: Diagnosis present

## 2023-10-20 DIAGNOSIS — G479 Sleep disorder, unspecified: Secondary | ICD-10-CM | POA: Diagnosis present

## 2023-10-20 DIAGNOSIS — T424X1A Poisoning by benzodiazepines, accidental (unintentional), initial encounter: Secondary | ICD-10-CM | POA: Diagnosis present

## 2023-10-20 DIAGNOSIS — E872 Acidosis, unspecified: Secondary | ICD-10-CM | POA: Diagnosis present

## 2023-10-20 DIAGNOSIS — Z800 Family history of malignant neoplasm of digestive organs: Secondary | ICD-10-CM

## 2023-10-20 DIAGNOSIS — E876 Hypokalemia: Secondary | ICD-10-CM | POA: Diagnosis present

## 2023-10-20 DIAGNOSIS — Y904 Blood alcohol level of 80-99 mg/100 ml: Secondary | ICD-10-CM | POA: Diagnosis present

## 2023-10-20 DIAGNOSIS — G928 Other toxic encephalopathy: Secondary | ICD-10-CM | POA: Diagnosis present

## 2023-10-20 DIAGNOSIS — I10 Essential (primary) hypertension: Secondary | ICD-10-CM | POA: Diagnosis present

## 2023-10-20 DIAGNOSIS — Z79899 Other long term (current) drug therapy: Secondary | ICD-10-CM | POA: Diagnosis not present

## 2023-10-20 DIAGNOSIS — R197 Diarrhea, unspecified: Secondary | ICD-10-CM | POA: Diagnosis present

## 2023-10-20 DIAGNOSIS — D72829 Elevated white blood cell count, unspecified: Secondary | ICD-10-CM | POA: Diagnosis present

## 2023-10-20 DIAGNOSIS — F331 Major depressive disorder, recurrent, moderate: Secondary | ICD-10-CM | POA: Diagnosis present

## 2023-10-20 DIAGNOSIS — F17290 Nicotine dependence, other tobacco product, uncomplicated: Secondary | ICD-10-CM | POA: Diagnosis present

## 2023-10-20 DIAGNOSIS — K219 Gastro-esophageal reflux disease without esophagitis: Secondary | ICD-10-CM | POA: Diagnosis present

## 2023-10-20 DIAGNOSIS — F419 Anxiety disorder, unspecified: Secondary | ICD-10-CM | POA: Diagnosis present

## 2023-10-20 DIAGNOSIS — F10132 Alcohol abuse with withdrawal with perceptual disturbance: Secondary | ICD-10-CM | POA: Diagnosis present

## 2023-10-20 DIAGNOSIS — F10931 Alcohol use, unspecified with withdrawal delirium: Secondary | ICD-10-CM

## 2023-10-20 LAB — CBC
HCT: 13.2 % — CL (ref 39.0–52.0)
HCT: 42.2 % (ref 39.0–52.0)
Hemoglobin: 4.4 g/dL — CL (ref 13.0–17.0)
Hemoglobin: 14.5 g/dL (ref 13.0–17.0)
MCH: 32.1 pg (ref 26.0–34.0)
MCH: 30.9 pg (ref 26.0–34.0)
MCHC: 33.3 g/dL (ref 30.0–36.0)
MCHC: 34.4 g/dL (ref 30.0–36.0)
MCV: 96.4 fL (ref 80.0–100.0)
MCV: 89.8 fL (ref 80.0–100.0)
Platelets: 121 K/uL — ABNORMAL LOW (ref 150–400)
Platelets: 366 K/uL (ref 150–400)
RBC: 1.37 MIL/uL — ABNORMAL LOW (ref 4.22–5.81)
RBC: 4.7 MIL/uL (ref 4.22–5.81)
RDW: 12.7 % (ref 11.5–15.5)
RDW: 12.6 % (ref 11.5–15.5)
WBC: 3.7 K/uL — ABNORMAL LOW (ref 4.0–10.5)
WBC: 13.8 K/uL — ABNORMAL HIGH (ref 4.0–10.5)
nRBC: 0 % (ref 0.0–0.2)
nRBC: 0 % (ref 0.0–0.2)

## 2023-10-20 LAB — COMPREHENSIVE METABOLIC PANEL WITH GFR
ALT: 29 U/L (ref 0–44)
AST: 37 U/L (ref 15–41)
Albumin: 4.5 g/dL (ref 3.5–5.0)
Alkaline Phosphatase: 61 U/L (ref 38–126)
Anion gap: 18 — ABNORMAL HIGH (ref 5–15)
BUN: 11 mg/dL (ref 6–20)
CO2: 18 mmol/L — ABNORMAL LOW (ref 22–32)
Calcium: 9.8 mg/dL (ref 8.9–10.3)
Chloride: 102 mmol/L (ref 98–111)
Creatinine, Ser: 0.97 mg/dL (ref 0.61–1.24)
GFR, Estimated: >60 mL/min (ref >60)
Glucose, Bld: 159 mg/dL — ABNORMAL HIGH (ref 70–99)
Potassium: 3.2 mmol/L — ABNORMAL LOW (ref 3.5–5.1)
Sodium: 138 mmol/L (ref 135–145)
Total Bilirubin: 0.6 mg/dL (ref 0.0–1.2)
Total Protein: 8.6 g/dL — ABNORMAL HIGH (ref 6.5–8.1)

## 2023-10-20 LAB — LIPASE, BLOOD: Lipase: 104 U/L — ABNORMAL HIGH (ref 11–51)

## 2023-10-20 LAB — LACTIC ACID, PLASMA: Lactic Acid, Venous: 3.8 mmol/L (ref 0.5–1.9)

## 2023-10-20 LAB — GLUCOSE, CAPILLARY: Glucose-Capillary: 164 mg/dL — ABNORMAL HIGH (ref 70–99)

## 2023-10-20 LAB — ETHANOL: Alcohol, Ethyl (B): 85 mg/dL — ABNORMAL HIGH (ref <15)

## 2023-10-20 MED ORDER — PHENOBARBITAL SODIUM 65 MG/ML IJ SOLN
65.0000 mg | Freq: Once | INTRAMUSCULAR | Status: AC
  Administered 2023-10-20: 65 mg via INTRAVENOUS
  Filled 2023-10-20: qty 1

## 2023-10-20 MED ORDER — CHLORHEXIDINE GLUCONATE CLOTH 2 % EX PADS
6.0000 | MEDICATED_PAD | Freq: Every day | CUTANEOUS | Status: DC
  Administered 2023-10-21 – 2023-10-22 (×2): 6 via TOPICAL

## 2023-10-20 MED ORDER — THIAMINE HCL 100 MG/ML IJ SOLN
100.0000 mg | Freq: Every day | INTRAMUSCULAR | Status: DC
  Administered 2023-10-20: 100 mg via INTRAVENOUS
  Filled 2023-10-20: qty 2

## 2023-10-20 MED ORDER — LORAZEPAM 2 MG/ML IJ SOLN
0.0000 mg | Freq: Four times a day (QID) | INTRAMUSCULAR | Status: DC
  Administered 2023-10-20: 4 mg via INTRAVENOUS
  Filled 2023-10-20: qty 2

## 2023-10-20 MED ORDER — ONDANSETRON HCL 4 MG/2ML IJ SOLN
4.0000 mg | Freq: Once | INTRAMUSCULAR | Status: AC
  Administered 2023-10-20: 4 mg via INTRAVENOUS
  Filled 2023-10-20: qty 2

## 2023-10-20 MED ORDER — SODIUM CHLORIDE 0.9 % IV BOLUS
1000.0000 mL | Freq: Once | INTRAVENOUS | Status: AC
  Administered 2023-10-20: 1000 mL via INTRAVENOUS

## 2023-10-20 MED ORDER — THIAMINE MONONITRATE 100 MG PO TABS
100.0000 mg | ORAL_TABLET | Freq: Every day | ORAL | Status: DC
  Administered 2023-10-21 – 2023-10-25 (×5): 100 mg via ORAL
  Filled 2023-10-20 (×5): qty 1

## 2023-10-20 MED ORDER — LORAZEPAM 1 MG PO TABS
1.0000 mg | ORAL_TABLET | ORAL | Status: AC | PRN
  Administered 2023-10-21: 2 mg via ORAL
  Administered 2023-10-21 (×2): 4 mg via ORAL
  Administered 2023-10-21 (×2): 2 mg via ORAL
  Administered 2023-10-21: 3 mg via ORAL
  Administered 2023-10-22 (×4): 2 mg via ORAL
  Filled 2023-10-20 (×3): qty 2
  Filled 2023-10-20: qty 4
  Filled 2023-10-20 (×3): qty 2
  Filled 2023-10-20 (×2): qty 4
  Filled 2023-10-20: qty 2

## 2023-10-20 MED ORDER — POTASSIUM CHLORIDE 10 MEQ/100ML IV SOLN
10.0000 meq | INTRAVENOUS | Status: AC
  Administered 2023-10-21: 10 meq via INTRAVENOUS
  Filled 2023-10-20 (×3): qty 100

## 2023-10-20 MED ORDER — FOLIC ACID 1 MG PO TABS
1.0000 mg | ORAL_TABLET | Freq: Every day | ORAL | Status: DC
  Administered 2023-10-21 – 2023-10-25 (×5): 1 mg via ORAL
  Filled 2023-10-20 (×5): qty 1

## 2023-10-20 MED ORDER — LORAZEPAM 2 MG PO TABS: 0.0000 mg | ORAL_TABLET | Freq: Two times a day (BID) | ORAL | Status: DC

## 2023-10-20 MED ORDER — LORAZEPAM 2 MG/ML IJ SOLN
1.0000 mg | INTRAMUSCULAR | Status: AC | PRN
  Administered 2023-10-21: 1 mg via INTRAVENOUS
  Filled 2023-10-20: qty 1

## 2023-10-20 MED ORDER — ADULT MULTIVITAMIN W/MINERALS CH
1.0000 | ORAL_TABLET | Freq: Every day | ORAL | Status: DC
  Administered 2023-10-21 – 2023-10-25 (×5): 1 via ORAL
  Filled 2023-10-20 (×5): qty 1

## 2023-10-20 MED ORDER — DOCUSATE SODIUM 100 MG PO CAPS: 100.0000 mg | ORAL_CAPSULE | Freq: Two times a day (BID) | ORAL | Status: DC | PRN

## 2023-10-20 MED ORDER — LORAZEPAM 2 MG/ML IJ SOLN: 0.0000 mg | Freq: Two times a day (BID) | INTRAMUSCULAR | Status: DC

## 2023-10-20 MED ORDER — LORAZEPAM 2 MG PO TABS: 0.0000 mg | ORAL_TABLET | Freq: Four times a day (QID) | ORAL | Status: DC

## 2023-10-20 MED ORDER — LORAZEPAM 2 MG/ML IJ SOLN
2.0000 mg | Freq: Once | INTRAMUSCULAR | Status: AC
  Administered 2023-10-20: 2 mg via INTRAVENOUS
  Filled 2023-10-20: qty 1

## 2023-10-20 MED ORDER — POLYETHYLENE GLYCOL 3350 17 G PO PACK: 17.0000 g | PACK | Freq: Every day | ORAL | Status: DC | PRN
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
## 2023-10-20 NOTE — ED Provider Notes (Signed)
 ED Progress Note  October 20, 2023 4:17 PM Called [facility] due to [facility] not having librium.  I moved his prescription to [facility] in [place].

## 2023-10-20 NOTE — ED Provider Notes (Signed)
 Received sign out from previous provider.  Patient Summary: Martin Garcia is a 38 y.o. male PMHx EtOH use presents with chest pressure/palpitations, anxiety, tremor in the setting of reduced alcohol use yesterday.  Workup reassuring, symptoms consistent with EtOH withdrawal.  Patient with no history of EtOH withdrawal, however endorsing significant alcohol use daily. Action List:  Reassess and dispo  Updates ED Course as of 10/20/23 0905  Fri Oct 20, 2023  9152 Prolonged conversation with patient, suspect his symptoms are associated with EtOH withdrawal.  He is currently endorsing some mild anxiety and has a very mild tremor, however is otherwise asymptomatic.  He notes that though his last drink was yesterday afternoon, he usually drinks 1-1/2 pints of liquor per day and drink about half this yesterday.  He has several children and a wife, who will drive him home, and he notes that he has a strong desire to reduce or stop his alcohol use.  He is interested in a Librium taper as well as starting naltrexone; he believes he will be able to follow-up with his PCP within the next several days.  Will discharge with return precautions

## 2023-10-20 NOTE — Progress Notes (Signed)
 PHARMACY CONSULT NOTE - ELECTROLYTES  Pharmacy Consult for Electrolyte Monitoring and Replacement   Recent Labs: Height: 6' (182.9 cm) Weight: 104.3 kg (230 lb) IBW/kg (Calculated) : 77.6 Estimated Creatinine Clearance: 127.7 mL/min (by C-G formula based on SCr of 0.97 mg/dL). Potassium (mmol/L)  Date Value  10/20/2023 3.2 (L)  06/13/2014 3.7   Magnesium  (mg/dL)  Date Value  95/82/7978 2.0   Calcium  (mg/dL)  Date Value  90/94/7974 9.8   Calcium , Total (mg/dL)  Date Value  95/70/7983 8.4 (L)   Albumin (g/dL)  Date Value  90/94/7974 4.5  08/29/2022 4.9  06/13/2014 4.6   Phosphorus (mg/dL)  Date Value  97/98/7980 4.5   Sodium (mmol/L)  Date Value  10/20/2023 138  08/29/2022 138  06/13/2014 138   Assessment  Martin Garcia is a 38 y.o. male presenting with seizure. PMH significant for HTN and history of small bowel obstruction. Pharmacy has been consulted to monitor and replace electrolytes.  Diet: NPO MIVF: N/A Pertinent medications: N/A  Goal of Therapy: Electrolytes WNL  Plan:  K 3.2: Give KCL 10 mEq IV x4 Check BMP, Mg, Phos with AM labs  Thank you for allowing pharmacy to be a part of this patient's care.  Damien Napoleon, PharmD Clinical Pharmacist 10/20/2023 10:16 PM

## 2023-10-20 NOTE — ED Triage Notes (Signed)
 Pt was BIB EMS from home. Pt was seen at [facility] for alcohol withdrawal yesterday and was prescribed librium. EMS was called today for a seizure. Pt was very combative and aggressive with EMS. EMS placed 20g IV in the Rt hand and gave 5mg  of IM versed .

## 2023-10-20 NOTE — ED Triage Notes (Signed)
 Here POV with friend for sudden chest pain that woke him up out of his sleep. States that this happened a few years ago and he was seen at [facility] but cannot recall what they told him it was. Tachycardic and tachypneic at [facility]. GCS15. Independently ambulatory into department.

## 2023-10-20 NOTE — ED Notes (Signed)
 Pt very combative with staff. Pt also having multiple episodes of diarrhea that is rolling off the bed. PT kicking and swinging at staff. MD notified. RN unable to get VS at this time.

## 2023-10-20 NOTE — ED Notes (Signed)
 Wife Corean can be reached at [phone]. She has left for the night.

## 2023-10-20 NOTE — ED Notes (Signed)
 I supervised care provided by the resident. We have discussed the case, I have reviewed the note, and I agree with the plan of treatment.  I personally was present during the exam by the resident.      I have personally reviewed the X-rays.  I agree with the resident's interpretation of the X-rays.  I have personally reviewed the EKG.  I agree with the resident's interpretation of the EKG.  38 year old male presents to the emergency department after waking acutely with palpitations and chest pain that went to his left arm.  He has had similar presentations in the past with negative workups.  Patient also complaining of epigastric pain with nausea.  While in the emergency department has had persistent nausea and vomiting.  Patient is a regular drinking, last drank last night.  Hypertensive, slightly tremulous and diaphoretic may be having some withdrawal symptoms now.  He does have a history of alcohol withdrawals but denies seizures.  Plan for CIWA scores, treatment of symptoms and reevaluation
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
## 2023-10-20 NOTE — ED Notes (Signed)
 Patient with another episode of watery stool. No control of bowel. Yellow with undigested food. Patient complaining of abdominal pain.

## 2023-10-20 NOTE — ED Provider Notes (Signed)
 [facility] Provider Note    Event Date/Time   First MD Initiated Contact with Patient 10/20/23 1842     (approximate)  History   Chief Complaint: Seizures  HPI  Martin Garcia is a 38 y.o. male with a past medical history of hypertension, alcohol abuse, presents to the emergency department for reported seizure.  I reviewed the patient's records he was seen overnight at [facility], discharged this morning with a Librium prescription.  Patient was able to pick it up this afternoon but only took 1 dose.  Per EMS report patient then while having diarrhea sitting on the commode had a seizure.  Per EMS patient was combative and aggressive, they were able to get a 20-gauge IV in the right hand after giving 5 mg of IM Versed .  Here patient has had diarrhea on himself.  He has nauseated, spitting secretions in his mouth and nose.  Patient appears confused/altered.  Somewhat combative.  Does attempt to answer some questions at times although largely inaccurate.  Physical Exam   Triage Vital Signs: ED Triage Vitals [10/20/23 1842]  Encounter Vitals Group     BP      Girls Systolic BP Percentile      Girls Diastolic BP Percentile      Boys Systolic BP Percentile      Boys Diastolic BP Percentile      Pulse      Resp      Temp      Temp src      SpO2      Weight 230 lb (104.3 kg)     Height 6' (1.829 m)     Head Circumference      Peak Flow      Pain Score 0     Pain Loc      Pain Education      Exclude from Growth Chart     Most recent vital signs: There were no vitals filed for this visit.  General: Somnolent, will awaken at times, appears confused/agitated. CV:  Good peripheral perfusion.  Regular rhythm rate around 100 bpm. Resp:  Normal effort.  Equal breath sounds bilaterally.  Abd:  No distention.  Soft, nontender.    ED Results / Procedures / Treatments   MEDICATIONS ORDERED IN ED: Medications  LORazepam  (ATIVAN ) injection 2 mg (has no  administration in time range)  sodium chloride  0.9 % bolus 1,000 mL (has no administration in time range)  ondansetron  (ZOFRAN ) injection 4 mg (has no administration in time range)  LORazepam  (ATIVAN ) injection 0-4 mg (has no administration in time range)    Or  LORazepam  (ATIVAN ) tablet 0-4 mg (has no administration in time range)  LORazepam  (ATIVAN ) injection 0-4 mg (has no administration in time range)    Or  LORazepam  (ATIVAN ) tablet 0-4 mg (has no administration in time range)  thiamine  (VITAMIN B1) tablet 100 mg (has no administration in time range)    Or  thiamine  (VITAMIN B1) injection 100 mg (has no administration in time range)     IMPRESSION / MDM / ASSESSMENT AND PLAN / ED COURSE  I reviewed the triage vital signs and the nursing notes.  Patient's presentation is most consistent with acute presentation with potential threat to life or bodily function.  Patient presents to the emergency department for a reported seizure at home as well as diarrhea.  Here patient is confused, agitated per EMS somewhat agitated at times here.  Continues to be diarrhea  in the emergency department.  Appears nauseated, secretions in mouth and nose spitting at times.  I wrote the patient's chart at [facility] patient had fairly significant workup ultimately diagnosed with alcohol withdrawal was prescribed Librium however the patient per EMS report took 1 dose of the medication this evening prior to the seizure.  No history of seizures previously.  Patient's current confusion agitation and presentation I suspect is a combination of significant alcohol withdrawal in addition to postictal state.  Will dose additional 2 mg of Ativan  IV, IV hydrate, check labs and continue to closely monitor.  Will place on CIWA protocol.  Patient will require admission for complicated alcohol withdrawal.  Initial lab work showed a hemoglobin of 4.4 however I suspect this was dilutional given his normal lab work at [facility] earlier this  morning.  Repeat CBC confirms with a hemoglobin of 14.5 with a white blood cell count of 13.8.  Alcohol level slightly elevated at 85.  Chemistry has hemolyzed and has been resent.  Patient remains altered/confused.  Wife is here with the patient.  I spoke to the wife regarding the patient's significant alcohol withdrawal now complicated by likely seizure at home.  The wife witnessed the seizure at home followed by significant confusion and agitation consistent with a postictal state.  Given the patient's continued significant withdrawal symptoms with significant CIWA elevations requiring significant amounts of Ativan  and possibly addition of phenobarbital  or Precedex  I spoke to the intensive care unit who will be admitting to their service for further treatment of his complicated alcohol withdrawal.  Patient's chemistry has resulted with a anion gap of 18 bicarb of 18.  Will continue with IV hydration.   CRITICAL CARE Performed by: Franky Moores   Total critical care time: 45 minutes  Critical care time was exclusive of separately billable procedures and treating other patients.  Critical care was necessary to treat or prevent imminent or life-threatening deterioration.  Critical care was time spent personally by me on the following activities: development of treatment plan with patient and/or surrogate as well as nursing, discussions with consultants, evaluation of patient's response to treatment, examination of patient, obtaining history from patient or surrogate, ordering and performing treatments and interventions, ordering and review of laboratory studies, ordering and review of radiographic studies, pulse oximetry and re-evaluation of patient's condition.   FINAL CLINICAL IMPRESSION(S) / ED DIAGNOSES   Complicated alcohol withdrawal Withdrawal seizure   Note:  This document was prepared using Dragon voice recognition software and may include unintentional dictation errors.    Moores Franky, MD 10/20/23 2151

## 2023-10-21 DIAGNOSIS — F10132 Alcohol abuse with withdrawal with perceptual disturbance: Secondary | ICD-10-CM | POA: Diagnosis not present

## 2023-10-21 LAB — GASTROINTESTINAL PANEL BY PCR, STOOL (REPLACES STOOL CULTURE)

## 2023-10-21 LAB — CBC
HCT: 45.6 % (ref 39.0–52.0)
Hemoglobin: 15.7 g/dL (ref 13.0–17.0)
MCH: 30.7 pg (ref 26.0–34.0)
MCHC: 34.4 g/dL (ref 30.0–36.0)
MCV: 89.1 fL (ref 80.0–100.0)
Platelets: 388 K/uL (ref 150–400)
RBC: 5.12 MIL/uL (ref 4.22–5.81)
RDW: 12.7 % (ref 11.5–15.5)
WBC: 17 K/uL — ABNORMAL HIGH (ref 4.0–10.5)
nRBC: 0 % (ref 0.0–0.2)

## 2023-10-21 LAB — MRSA NEXT GEN BY PCR, NASAL: MRSA by PCR Next Gen: NOT DETECTED

## 2023-10-21 LAB — BASIC METABOLIC PANEL WITH GFR
Anion gap: 15 (ref 5–15)
BUN: 14 mg/dL (ref 6–20)
CO2: 15 mmol/L — ABNORMAL LOW (ref 22–32)
Calcium: 9.4 mg/dL (ref 8.9–10.3)
Chloride: 105 mmol/L (ref 98–111)
Creatinine, Ser: 1.1 mg/dL (ref 0.61–1.24)
GFR, Estimated: >60 mL/min (ref >60)
Glucose, Bld: 188 mg/dL — ABNORMAL HIGH (ref 70–99)
Potassium: 3.7 mmol/L (ref 3.5–5.1)
Sodium: 135 mmol/L (ref 135–145)

## 2023-10-21 LAB — LACTIC ACID, PLASMA: Lactic Acid, Venous: 3.7 mmol/L (ref 0.5–1.9)

## 2023-10-21 LAB — C DIFFICILE QUICK SCREEN W PCR REFLEX
C Diff antigen: NEGATIVE
C Diff interpretation: NOT DETECTED
C Diff toxin: NEGATIVE

## 2023-10-21 LAB — HIV ANTIBODY (ROUTINE TESTING W REFLEX): HIV Screen 4th Generation wRfx: NONREACTIVE

## 2023-10-21 LAB — PROCALCITONIN: Procalcitonin: <0.1 ng/mL

## 2023-10-21 LAB — PHOSPHORUS: Phosphorus: 2.7 mg/dL (ref 2.5–4.6)

## 2023-10-21 LAB — MAGNESIUM: Magnesium: 2.1 mg/dL (ref 1.7–2.4)

## 2023-10-21 MED ORDER — CHLORDIAZEPOXIDE HCL 5 MG PO CAPS: 25.0000 mg | ORAL_CAPSULE | Freq: Three times a day (TID) | ORAL | Status: DC

## 2023-10-21 MED ORDER — CHLORDIAZEPOXIDE HCL 25 MG PO CAPS: 25.0000 mg | ORAL_CAPSULE | ORAL | Status: DC

## 2023-10-21 MED ORDER — METHADONE HCL 10 MG/ML PO CONC
115.0000 mg | Freq: Every day | ORAL | Status: DC
  Administered 2023-10-21 – 2023-10-25 (×5): 115 mg via ORAL
  Filled 2023-10-21 (×5): qty 15

## 2023-10-21 MED ORDER — PHENOBARBITAL 32.4 MG PO TABS: 64.8000 mg | ORAL_TABLET | Freq: Every day | ORAL | Status: DC

## 2023-10-21 MED ORDER — HYDROXYZINE HCL 25 MG PO TABS
25.0000 mg | ORAL_TABLET | Freq: Four times a day (QID) | ORAL | Status: AC | PRN
  Administered 2023-10-22 – 2023-10-23 (×3): 25 mg via ORAL
  Filled 2023-10-21 (×5): qty 1

## 2023-10-21 MED ORDER — ENOXAPARIN SODIUM 60 MG/0.6ML IJ SOSY
50.0000 mg | PREFILLED_SYRINGE | INTRAMUSCULAR | Status: DC
  Administered 2023-10-22 – 2023-10-25 (×4): 50 mg via SUBCUTANEOUS
  Filled 2023-10-21 (×5): qty 0.6

## 2023-10-21 MED ORDER — POTASSIUM CHLORIDE CRYS ER 20 MEQ PO TBCR
40.0000 meq | EXTENDED_RELEASE_TABLET | Freq: Once | ORAL | Status: AC
  Administered 2023-10-21: 40 meq via ORAL
  Filled 2023-10-21: qty 2

## 2023-10-21 MED ORDER — CHLORDIAZEPOXIDE HCL 25 MG PO CAPS: 25.0000 mg | ORAL_CAPSULE | Freq: Every day | ORAL | Status: DC

## 2023-10-21 MED ORDER — PHENOBARBITAL 32.4 MG PO TABS: 64.8000 mg | ORAL_TABLET | Freq: Two times a day (BID) | ORAL | Status: DC

## 2023-10-21 MED ORDER — PHENOBARBITAL 32.4 MG PO TABS
64.8000 mg | ORAL_TABLET | Freq: Three times a day (TID) | ORAL | Status: DC
  Administered 2023-10-21 – 2023-10-23 (×5): 64.8 mg via ORAL
  Filled 2023-10-21 (×5): qty 2

## 2023-10-21 MED ORDER — ACETAMINOPHEN 325 MG PO TABS
650.0000 mg | ORAL_TABLET | Freq: Four times a day (QID) | ORAL | Status: DC | PRN
  Administered 2023-10-21 – 2023-10-25 (×8): 650 mg via ORAL
  Filled 2023-10-21 (×8): qty 2

## 2023-10-21 MED ORDER — FENTANYL CITRATE PF 50 MCG/ML IJ SOSY
50.0000 ug | PREFILLED_SYRINGE | Freq: Once | INTRAMUSCULAR | Status: AC
  Administered 2023-10-21: 50 ug via INTRAVENOUS
  Filled 2023-10-21: qty 1

## 2023-10-21 MED ORDER — ONDANSETRON 4 MG PO TBDP
4.0000 mg | ORAL_TABLET | Freq: Four times a day (QID) | ORAL | Status: AC | PRN
  Administered 2023-10-21: 4 mg via ORAL
  Filled 2023-10-21 (×2): qty 1

## 2023-10-21 MED ORDER — SODIUM CHLORIDE 0.9 % IV SOLN: INTRAVENOUS | Status: AC

## 2023-10-21 MED ORDER — CHLORDIAZEPOXIDE HCL 25 MG PO CAPS
25.0000 mg | ORAL_CAPSULE | Freq: Four times a day (QID) | ORAL | Status: DC
  Administered 2023-10-21 (×3): 25 mg via ORAL
  Filled 2023-10-21: qty 5
  Filled 2023-10-21: qty 1
  Filled 2023-10-21: qty 5

## 2023-10-21 MED ORDER — LOPERAMIDE HCL 2 MG PO CAPS
2.0000 mg | ORAL_CAPSULE | ORAL | Status: AC | PRN
  Administered 2023-10-21: 4 mg via ORAL
  Administered 2023-10-22: 2 mg via ORAL
  Filled 2023-10-21: qty 1
  Filled 2023-10-21: qty 2

## 2023-10-21 NOTE — TOC Progression Note (Signed)
 Transition of Care [facility]) - Progression Note    Patient Details  Name: Martin Garcia MRN: [id number] Date of Birth: 13-Jun-1985  Transition of [facility]) CM/SW Contact  [name], LCSW Phone Number: 10/21/2023, 11:06 AM  Clinical Narrative:    Chart reviewed. I attempted to consult with the patient at bedside today. When called the patient name 2x the patient remained sleep. CSW will consult at a later time.                      Expected Discharge Plan and Services                                               Social Drivers of Health (SDOH) Interventions SDOH Screenings   Food Insecurity: No Food Insecurity (08/29/2022)  Housing: Low Risk  (08/29/2022)  Transportation Needs: No Transportation Needs (08/29/2022)  Alcohol Screen: Low Risk  (08/29/2022)  Depression (PHQ2-9): Low Risk  (08/29/2022)  Financial Resource Strain: Low Risk  (08/29/2022)  Physical Activity: Sufficiently Active (08/29/2022)  Social Connections: Moderately Integrated (08/29/2022)  Stress: No Stress Concern Present (08/29/2022)  Tobacco Use: Medium Risk (10/20/2023)    Readmission Risk Interventions     No data to display

## 2023-10-21 NOTE — TOC CM/SW Note (Signed)
..  Transition of Care [facility]) - Inpatient Brief Assessment   Patient Details  Name: Martin Garcia MRN: [id number] Date of Birth: 10-08-1985  Transition of Care [facility]) CM/SW Contact:    Edsel DELENA Fischer, LCSW Phone Number: 10/21/2023, 9:59 AM   Clinical Narrative:  TOC added SU resources to AVS   Transition of Care Asessment:

## 2023-10-21 NOTE — Plan of Care (Signed)
  Problem: Education: Goal: Knowledge of General Education information will improve Description: Including pain rating scale, medication(s)/side effects and non-pharmacologic comfort measures Outcome: Progressing   Problem: Clinical Measurements: Goal: Ability to maintain clinical measurements within normal limits will improve Outcome: Progressing Goal: Respiratory complications will improve Outcome: Progressing Goal: Cardiovascular complication will be avoided Outcome: Progressing   Problem: Activity: Goal: Risk for activity intolerance will decrease Outcome: Progressing   Problem: Elimination: Goal: Will not experience complications related to bowel motility Outcome: Progressing Goal: Will not experience complications related to urinary retention Outcome: Progressing   Problem: Pain Managment: Goal: General experience of comfort will improve and/or be controlled Outcome: Progressing   Problem: Safety: Goal: Ability to remain free from injury will improve Outcome: Progressing   Problem: Skin Integrity: Goal: Risk for impaired skin integrity will decrease Outcome: Progressing   Problem: Health Behavior/Discharge Planning: Goal: Ability to manage health-related needs will improve Outcome: Not Progressing   Problem: Nutrition: Goal: Adequate nutrition will be maintained Outcome: Not Progressing   Problem: Coping: Goal: Level of anxiety will decrease Outcome: Not Progressing
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
## 2023-10-21 NOTE — Progress Notes (Signed)
 eLink Physician-Brief Progress Note Patient Name: Martin Garcia DOB: 1985-03-30 MRN: [id number]   Date of Service  10/21/2023  HPI/Events of Note  38 year old male with a history of essential hypertension, alcohol use who presents to the emergency department for seizure after discharge earlier in the morning with Librium  taper.  Encephalopathic.  Mild fever, tachypnea and hypertension.  Saturating 94% on room air.  Results with electrolyte disturbances, anion gap metabolic acidosis lactic acidosis and leukocytosis.  eICU Interventions  Maintain alcohol withdrawal protocol with as needed Ativan , multivitamins  IV fluids, trend lactic  DVT prophylaxis-add enoxaparin  GI prophylaxis not indicated     Intervention Category Evaluation Type: New Patient Evaluation  [name] 10/21/2023, 12:32 AM

## 2023-10-21 NOTE — Progress Notes (Signed)
 PHARMACIST - PHYSICIAN COMMUNICATION  CONCERNING:  Enoxaparin  (Lovenox ) for DVT Prophylaxis    RECOMMENDATION: Patient was prescribed enoxaprin 40mg  q24 hours for VTE prophylaxis.   Filed Weights   10/20/23 1842 10/20/23 1913  Weight: 104.3 kg (230 lb) 104.3 kg (230 lb)    Body mass index is 31.19 kg/m.  Estimated Creatinine Clearance: 127.7 mL/min (by C-G formula based on SCr of 0.97 mg/dL).   Based on [facility] policy patient is candidate for enoxaparin  0.5mg /kg TBW SQ every 24 hours based on BMI being >30.  DESCRIPTION: Pharmacy has adjusted enoxaparin  dose per [facility] policy.  Patient is now receiving enoxaparin  0.5 mg/kg every 24 hours   Rankin CANDIE Dills, PharmD, [facility] 10/21/2023 12:39 AM

## 2023-10-21 NOTE — Progress Notes (Signed)
 PHARMACY CONSULT NOTE - ELECTROLYTES  Pharmacy Consult for Electrolyte Monitoring and Replacement   Recent Labs: Height: 6' (182.9 cm) Weight: 82.1 kg (181 lb) IBW/kg (Calculated) : 77.6 Estimated Creatinine Clearance: 99 mL/min (by C-G formula based on SCr of 1.1 mg/dL). Potassium (mmol/L)  Date Value  10/21/2023 3.7  06/13/2014 3.7   Magnesium  (mg/dL)  Date Value  90/93/7974 2.1   Calcium  (mg/dL)  Date Value  90/93/7974 9.4   Calcium , Total (mg/dL)  Date Value  95/70/7983 8.4 (L)   Albumin (g/dL)  Date Value  90/94/7974 4.5  08/29/2022 4.9  06/13/2014 4.6   Phosphorus (mg/dL)  Date Value  90/93/7974 2.7   Sodium (mmol/L)  Date Value  10/21/2023 135  08/29/2022 138  06/13/2014 138   Assessment  Martin Garcia is a 38 y.o. male presenting with seizure. PMH significant for HTN and history of small bowel obstruction. Pharmacy has been consulted to monitor and replace electrolytes.  Diet: NPO MIVF: N/A Pertinent medications: N/A  Goal of Therapy: Electrolytes WNL  Plan:  No electrolyte replacement warranted for today Check BMP, Mg, Phos with AM labs  Thank you for allowing pharmacy to be a part of this patient's care.  Adriana JONETTA Bolster, PharmD Clinical Pharmacist 10/21/2023 7:23 AM

## 2023-10-21 NOTE — Discharge Instructions (Addendum)
 Substance Use Resources  [facility], 1-800-662-HELP 564-305-2668) (also known as the Treatment Referral Routing Service), or TTY: (631)255-2972 is a confidential, free, 24-hour-a-day, 365-day-a-year, information service, in [place] and [place], for individuals and family members facing mental and/or substance use disorders. This service provides referrals to local treatment facilities, support groups, and community-based organizations.  If you need to talk to someone right now: Call 988 or text 988. For life-threatening emergencies, call 911 and ask for a [employer] agent. They have special training.  Mobile Crisis Management services provide intensive, on-site response, stabilization, and intervention for individuals of all ages who are experiencing a crisis related to mental health disturbances, developmental disabilities, or addiction. Our team of behavioral health professionals is available 24/7/365 to provide confidential and secure stabilization services for individuals in their homes, workplaces, schools, or anywhere within the community where a crisis occurs: Armed forces technical officer Crisis symptoms may include: Hearing voices Experiencing hallucinations Displaying irrational behavior Expressing intent to harm oneself or others Intravenous (IV) drug use Withdrawal from illegal substance use Using drugs while pregnant Becoming unmanageable due to mental illness Call [phone] for [facility] [place].  Comprehensive Care Center: [facility], 24/7 [facility] [address], [place], [place] [address] [phone]

## 2023-10-22 DIAGNOSIS — F10132 Alcohol abuse with withdrawal with perceptual disturbance: Secondary | ICD-10-CM | POA: Diagnosis not present

## 2023-10-22 LAB — URINE DRUG SCREEN, QUALITATIVE (ARMC ONLY)
Amphetamines, Ur Screen: NOT DETECTED
Barbiturates, Ur Screen: POSITIVE — AB
Benzodiazepine, Ur Scrn: POSITIVE — AB
Cannabinoid 50 Ng, Ur ~~LOC~~: NOT DETECTED
Cocaine Metabolite,Ur ~~LOC~~: NOT DETECTED
MDMA (Ecstasy)Ur Screen: NOT DETECTED
Methadone Scn, Ur: POSITIVE — AB
Opiate, Ur Screen: NOT DETECTED
Phencyclidine (PCP) Ur S: NOT DETECTED
Tricyclic, Ur Screen: NOT DETECTED

## 2023-10-22 LAB — CBC
HCT: 39.9 % (ref 39.0–52.0)
Hemoglobin: 13.8 g/dL (ref 13.0–17.0)
MCH: 31 pg (ref 26.0–34.0)
MCHC: 34.6 g/dL (ref 30.0–36.0)
MCV: 89.7 fL (ref 80.0–100.0)
Platelets: 308 K/uL (ref 150–400)
RBC: 4.45 MIL/uL (ref 4.22–5.81)
RDW: 12.7 % (ref 11.5–15.5)
WBC: 15.2 K/uL — ABNORMAL HIGH (ref 4.0–10.5)
nRBC: 0 % (ref 0.0–0.2)

## 2023-10-22 LAB — RENAL FUNCTION PANEL
Albumin: 3.7 g/dL (ref 3.5–5.0)
Anion gap: 11 (ref 5–15)
BUN: 21 mg/dL — ABNORMAL HIGH (ref 6–20)
CO2: 18 mmol/L — ABNORMAL LOW (ref 22–32)
Calcium: 9 mg/dL (ref 8.9–10.3)
Chloride: 107 mmol/L (ref 98–111)
Creatinine, Ser: 0.98 mg/dL (ref 0.61–1.24)
GFR, Estimated: >60 mL/min (ref >60)
Glucose, Bld: 127 mg/dL — ABNORMAL HIGH (ref 70–99)
Phosphorus: 3.3 mg/dL (ref 2.5–4.6)
Potassium: 3.6 mmol/L (ref 3.5–5.1)
Sodium: 136 mmol/L (ref 135–145)

## 2023-10-22 LAB — HEMOGLOBIN A1C
Hgb A1c MFr Bld: 5.2 % (ref 4.8–5.6)
Mean Plasma Glucose: 102.54 mg/dL

## 2023-10-22 LAB — VITAMIN D 25 HYDROXY (VIT D DEFICIENCY, FRACTURES): Vit D, 25-Hydroxy: 32.89 ng/mL (ref 30–100)

## 2023-10-22 LAB — PROCALCITONIN: Procalcitonin: <0.1 ng/mL

## 2023-10-22 LAB — MAGNESIUM: Magnesium: 2.5 mg/dL — ABNORMAL HIGH (ref 1.7–2.4)

## 2023-10-22 LAB — VITAMIN B12: Vitamin B-12: 230 pg/mL (ref 180–914)

## 2023-10-22 MED ORDER — SACCHAROMYCES BOULARDII 250 MG PO CAPS
250.0000 mg | ORAL_CAPSULE | Freq: Two times a day (BID) | ORAL | Status: DC
  Administered 2023-10-22 – 2023-10-25 (×7): 250 mg via ORAL
  Filled 2023-10-22 (×7): qty 1

## 2023-10-22 MED ORDER — PANTOPRAZOLE SODIUM 40 MG PO TBEC
40.0000 mg | DELAYED_RELEASE_TABLET | Freq: Two times a day (BID) | ORAL | Status: DC
  Administered 2023-10-23 – 2023-10-25 (×5): 40 mg via ORAL
  Filled 2023-10-22 (×5): qty 1

## 2023-10-22 MED ORDER — KETOROLAC TROMETHAMINE 30 MG/ML IJ SOLN
30.0000 mg | Freq: Once | INTRAMUSCULAR | Status: AC
  Administered 2023-10-22: 30 mg via INTRAVENOUS
  Filled 2023-10-22: qty 1

## 2023-10-22 MED ORDER — SODIUM BICARBONATE 8.4 % IV SOLN
INTRAVENOUS | Status: AC
  Filled 2023-10-22: qty 50

## 2023-10-22 MED ORDER — OXYCODONE HCL 5 MG PO TABS
5.0000 mg | ORAL_TABLET | ORAL | Status: DC | PRN
  Administered 2023-10-22 – 2023-10-25 (×14): 5 mg via ORAL
  Filled 2023-10-22 (×15): qty 1

## 2023-10-22 MED ORDER — SODIUM BICARBONATE 8.4 % IV SOLN
100.0000 meq | Freq: Once | INTRAVENOUS | Status: AC
  Administered 2023-10-22: 100 meq via INTRAVENOUS
  Filled 2023-10-22: qty 50

## 2023-10-22 MED ORDER — PANTOPRAZOLE SODIUM 40 MG IV SOLR
40.0000 mg | Freq: Two times a day (BID) | INTRAVENOUS | Status: AC
  Administered 2023-10-22 (×2): 40 mg via INTRAVENOUS
  Filled 2023-10-22 (×2): qty 10

## 2023-10-22 MED ORDER — OXYCODONE HCL 5 MG PO TABS
5.0000 mg | ORAL_TABLET | Freq: Four times a day (QID) | ORAL | Status: DC | PRN
  Administered 2023-10-22: 5 mg via ORAL
  Filled 2023-10-22: qty 1

## 2023-10-22 MED ORDER — NICOTINE 21 MG/24HR TD PT24
21.0000 mg | MEDICATED_PATCH | Freq: Every day | TRANSDERMAL | Status: DC
  Administered 2023-10-22 – 2023-10-25 (×4): 21 mg via TRANSDERMAL
  Filled 2023-10-22 (×4): qty 1

## 2023-10-22 MED ORDER — POTASSIUM CHLORIDE 20 MEQ PO PACK
40.0000 meq | PACK | Freq: Once | ORAL | Status: AC
  Administered 2023-10-22: 40 meq via ORAL
  Filled 2023-10-22: qty 2

## 2023-10-22 NOTE — Progress Notes (Signed)
 Received consult from LCSW. Introduced patient to role of Statistician. Intake questions completed.  Patient tearful when discussing his mental health and drinking habits. Patient states within the last 8 months-56yr he has been drinking at least a fifth of liquor a day. He states he drinks by himself, and uses it as a coping mechanism.   Patient endorses historically having passive SI thoughts. Denies any current SI/HI. Patient states he often feels worthless and not good enough as a dad and a husband. Patient aware current mental health status is contributing to his use of alcohol. Patient does list his children (two daughters and one son) and his significant other as protective factors. Would benefit from psych consult--MD made aware.  Patient states he has actively been looking for an alcoholic rehab program for several months. Unfortunately his use of methadone  causes his acceptance into these programs to be denied. Patient states he has been taking methadone  for about 5 years via the [facility]. Patient provided with intake numbers for [facility] and [facility]. He endorses having been in treatment over 9 years ago at [facility] and [facility].   Will follow along for dispo recs. Encouraged to call with questions or concerns.

## 2023-10-22 NOTE — TOC CM/SW Note (Signed)
..  Transition of Care [facility]) - Inpatient Brief Assessment   Patient Details  Name: CARYL MANAS MRN: [id number] Date of Birth: Oct 14, 1985  Transition of Care [facility]) CM/SW Contact:    Edsel DELENA Fischer, LCSW Phone Number: 10/22/2023, 12:28 PM   Clinical Narrative:  TOC spoke with pt at bedside.  Pt girlfriend- Corean [phone] was present as well.  Pt gave sw permission to speak in front of girlfriend and to contact [facility] and [facility] for additional supports.  TOC message [facility] for referral for support and services.TOC message Tiffanie Lang as well to follow up with pt once discharged. TOC discussed treatment options and provided pt with handouts and resources on SU treatments, services and supports.  Pt mention the death of his mom and growing up with traumatic events influence his alcoholism.  Pt was open to his sobriety and sorrowful regarding the choices made. Pt stated he attended [facility] in the past for help as well.  Pt stated that he tried himself to facilitate placement into inpatient tx facilities but has not be accepted.   TOC discussed pt utilizing outpatient therapy ([facility]) along with AA and SU groups while he waits for placement.  SW provided several contacts for pt to follow up with regarding local and state SU facilities.   Transition of Care Asessment:

## 2023-10-22 NOTE — Progress Notes (Addendum)
 Librium  discontinued due to GI side effects. Patient with persistent diarrhea with associated abdominal cramping. Per patient and significant other, patient took a dose of Librium  and immediately had explosive diarrhea with associated nausea, vomiting and abdominal pain. He was continued on Librium  but continued to have frequent large diarrhea. Librium  was discontinued and started on Phenobarb oral taper and Immodium. 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    Almarie Nose, DNP, CCRN, FNP-C, AGACNP-BC Acute Care & Family Nurse Practitioner   Pulmonary & Critical Care  See Amion for personal pager PCCM on call pager [phone] until 7 am

## 2023-10-22 NOTE — Progress Notes (Signed)
 [facility] Progress Note  Patient: Martin Garcia    FMW:[id number]  DOA: 10/20/2023     Date of Service: the patient was seen and examined on 10/22/2023  Chief Complaint  Patient presents with   Seizures   Brief hospital course: 38 y.o male with significant PMHx EtOH abuse drinks about 1-1-1/2 pints/day, EtOH withdrawal and hypertension of who presented to the ED with chief complaints of possible seizure like activity   Upon reviewing the chart, the patient was evalauted at [facility] Emergency Department earlier today due to chief complaints of chest pain. The Emergency Department staff assessed the symptoms and found them more consistent with alcohol withdrawal. As a result, the patient was discharged with a Librium  taper and naltrexone . According to the patient's significant other, who is currently at the bedside, the patient picked his medication from the pharmacy and took a dose of Librium . He then went to the kitchen to get something to eat where significant other reported unsual sounds as if he was dry heaving. The significant other checked on him and found him sitting on the toilet vomiting. He was also noted to be having epsiodes of diarrhea. Per significant other, the patient started to exhibited erratic behavior before he slumped onto the floor. She is not sure if he was having a seizure so she called EMS. The significant other confirmed that he did not hit his head. When EMS arrived, the patient was very combative and aggressive, necessitating sedation with 5 mg of IM Versed .   ED Course: Initial vital signs showed HR of 94 beats/minute, BP 136/77 mm Hg, the RR 20s breaths/minute, and the oxygen saturation 97% on room air and a temperature of 97.69F (36.5C).  Pertinent Labs/Diagnostics Findings: Na+/ K+: 138/3.2 WBC: 13.8 K/L  Lipase 104 CO2 18 AG 18 Ethanol 85 PCT: negative <0.10  Lactic acid: 3.8 CTA Chest> CT Abd/pelvis> negative Patient placed on CIWA per protocol.  PCCM  consulted for admission  9/5: Admitted to ICU with suspected EtOH withdrawal seizures 9/6 ICU needs resolving 9/7 transferred to TRH service  Assessment and Plan:  # Acute ETOH intoxication with EtOH Abuse  High Risk for Withdrawal Average Drinks Per Day 1.5 pints of Liquor/day -EtOH 85 -Utox positive barbiturate, benzo and methadone  - Continue phenobarb tapering dose started in ICU  -CIWA per protocol -Follow CMP, INR, Daily BMP+Mg -Daily Thiamine , Folate, MVI once tolerating PO -SW consult for cessation resources   # Seizures  Unclear per significant other if the erratic behavior was seizure. High risk for EtOH withdrawal seizures although low suspicion given ethanol level 85 -Suspect symptoms likely due to mixing Librium  with alcohol -Seizure precaution -Lorazepam  PRN for breakthrough seizure     # Hypokalemia AGMA with Lactic Acidosis due to ETOH abuse -Trend Lactate -Monitor I&O's / urinary output -Follow BMP -Replace electrolytes as indicated   # Diarrhea Likely side effects of mixing Librium  and alcohol -GI panel negative -C. difficile negative -CT abdominal pelvis negative -Lipase slightly elevated -Continue IV fluids hydration 9/7 started probiotics twice daily  GERD PPI 40 mg IV BID x 2 doses, followed by oral twice daily    Body mass index is 24.82 kg/m.  Interventions:  Diet: Regular diet DVT Prophylaxis: Subcutaneous Lovenox    Advance goals of care discussion: Full code  Family Communication: family was present at bedside, at the time of interview.  The pt provided permission to discuss medical plan with the family. Opportunity was given to ask question and all questions were  answered satisfactorily.   Disposition:  Pt is from home, admitted with EtOH withdrawal, still has withdrawal symptoms, which precludes a safe discharge. Discharge to home, when stable, may need few days to improve.  Subjective: No significant events overnight, patient  still complaining of abdominal pain in the epigastric area and also having diarrhea and loose stools, 3-4 episodes today, feeling nauseous no vomiting. Denied any chest pain or palpitation, no shortness of breath.  Physical Exam: General: NAD, lying comfortably Appear in no distress, affect appropriate Eyes: PERRLA ENT: Oral Mucosa Clear, moist  Neck: no JVD,  Cardiovascular: S1 and S2 Present, no Murmur,  Respiratory: good respiratory effort, Bilateral Air entry equal and Decreased, no Crackles, no wheezes Abdomen: BS present, Soft and mild epigastric tenderness,  Skin: no rashes Extremities: no Pedal edema, no calf tenderness Neurologic: without any new focal findings Gait not checked due to patient safety concerns  Vitals:   10/22/23 0800 10/22/23 1000 10/22/23 1114 10/22/23 1200  BP: (!) 133/94 (!) 134/98  (!) 131/100  Pulse: 82 94  (!) 102  Resp: (!) 25 (!) 26  16  Temp: 97.7 F (36.5 C)  99.4 F (37.4 C)   TempSrc: Oral  Axillary   SpO2: 97% 97%  97%  Weight:      Height:        Intake/Output Summary (Last 24 hours) at 10/22/2023 1251 Last data filed at 10/22/2023 1100 Gross per 24 hour  Intake 1414.42 ml  Output 1225 ml  Net 189.42 ml   Filed Weights   10/20/23 1913 10/21/23 0500 10/22/23 0400  Weight: 104.3 kg 82.1 kg 83 kg    Data Reviewed: I have personally reviewed and interpreted daily labs, tele strips, imagings as discussed above. I reviewed all nursing notes, pharmacy notes, vitals, pertinent old records I have discussed plan of care as described above with RN and patient/family.  CBC: Recent Labs  Lab 10/20/23 1928 10/20/23 2007 10/21/23 0356 10/22/23 0304  WBC 3.7* 13.8* 17.0* 15.2*  HGB 4.4* 14.5 15.7 13.8  HCT 13.2* 42.2 45.6 39.9  MCV 96.4 89.8 89.1 89.7  PLT 121* 366 388 308   Basic Metabolic Panel: Recent Labs  Lab 10/20/23 2100 10/21/23 0356 10/22/23 0304  NA 138 135 136  K 3.2* 3.7 3.6  CL 102 105 107  CO2 18* 15* 18*  GLUCOSE  159* 188* 127*  BUN 11 14 21*  CREATININE 0.97 1.10 0.98  CALCIUM  9.8 9.4 9.0  MG  --  2.1 2.5*  PHOS  --  2.7 3.3    Studies: No results found.  Scheduled Meds:  Chlorhexidine  Gluconate Cloth  6 each Topical Daily   enoxaparin  (LOVENOX ) injection  50 mg Subcutaneous Q24H   folic acid   1 mg Oral Daily   methadone   115 mg Oral Daily   multivitamin with minerals  1 tablet Oral Daily   nicotine   21 mg Transdermal Daily   pantoprazole  (PROTONIX ) IV  40 mg Intravenous BID   Followed by   NOREEN ON 10/23/2023] pantoprazole   40 mg Oral BID   phenobarbital   64.8 mg Oral TID   [START ON 10/23/2023] phenobarbital   64.8 mg Oral BID   [START ON 10/25/2023] phenobarbital   64.8 mg Oral QHS   saccharomyces boulardii  250 mg Oral BID   thiamine   100 mg Oral Daily   Or   thiamine   100 mg Intravenous Daily   Continuous Infusions: PRN Meds: acetaminophen , docusate sodium , hydrOXYzine , loperamide , LORazepam  **OR** LORazepam , ondansetron , oxyCODONE ,  polyethylene glycol  Time spent: 55 minutes  Author: ELVAN SOR. MD [facility] 10/22/2023 12:51 PM  To reach On-call, see care teams to locate the attending and reach out to them via [email or web address]. If 7PM-7AM, please contact night-coverage If you still have difficulty reaching the attending provider, please page the [facility] (Director on Call) for [facility] on amion for assistance.

## 2023-10-22 NOTE — Progress Notes (Signed)
 Patient transferred. AxOx4. VSS. All patient belongings kept at bedside transferred with patient.

## 2023-10-22 NOTE — Plan of Care (Signed)
   Problem: Clinical Measurements: Goal: Ability to maintain clinical measurements within normal limits will improve Outcome: Progressing Goal: Will remain free from infection Outcome: Progressing Goal: Diagnostic test results will improve Outcome: Progressing Goal: Respiratory complications will improve Outcome: Progressing Goal: Cardiovascular complication will be avoided Outcome: Progressing   Problem: Education: Goal: Knowledge of General Education information will improve Description: Including pain rating scale, medication(s)/side effects and non-pharmacologic comfort measures Outcome: Not Progressing   Problem: Health Behavior/Discharge Planning: Goal: Ability to manage health-related needs will improve Outcome: Not Progressing

## 2023-10-22 NOTE — Progress Notes (Signed)
 PHARMACY CONSULT NOTE - ELECTROLYTES  Pharmacy Consult for Electrolyte Monitoring and Replacement   Recent Labs: Height: 6' (182.9 cm) Weight: 83 kg (182 lb 15.7 oz) IBW/kg (Calculated) : 77.6 Estimated Creatinine Clearance: 111.1 mL/min (by C-G formula based on SCr of 0.98 mg/dL). Potassium (mmol/L)  Date Value  10/22/2023 3.6  06/13/2014 3.7   Magnesium  (mg/dL)  Date Value  90/92/7974 2.5 (H)   Calcium  (mg/dL)  Date Value  90/92/7974 9.0   Calcium , Total (mg/dL)  Date Value  95/70/7983 8.4 (L)   Albumin (g/dL)  Date Value  90/92/7974 3.7  08/29/2022 4.9  06/13/2014 4.6   Phosphorus (mg/dL)  Date Value  90/92/7974 3.3   Sodium (mmol/L)  Date Value  10/22/2023 136  08/29/2022 138  06/13/2014 138   Assessment  Martin Garcia is a 38 y.o. male presenting with seizure. PMH significant for HTN and history of small bowel obstruction. Pharmacy has been consulted to monitor and replace electrolytes.  Diet: NPO MIVF: N/A Pertinent medications: N/A  Goal of Therapy: Electrolytes WNL  Plan:  Will order Kcl po 40 meq po x 1 dose to optimize electrolytes. Check BMP, Mg, Phos with AM labs  Thank you for allowing pharmacy to be a part of this patient's care.  Margreat Widener Rodriguez-Guzman PharmD, BCPS 10/22/2023 9:31 AM

## 2023-10-23 DIAGNOSIS — F10132 Alcohol abuse with withdrawal with perceptual disturbance: Secondary | ICD-10-CM | POA: Diagnosis not present

## 2023-10-23 LAB — CBC
HCT: 38.3 % — ABNORMAL LOW (ref 39.0–52.0)
Hemoglobin: 13.2 g/dL (ref 13.0–17.0)
MCH: 31.4 pg (ref 26.0–34.0)
MCHC: 34.5 g/dL (ref 30.0–36.0)
MCV: 91 fL (ref 80.0–100.0)
Platelets: 270 K/uL (ref 150–400)
RBC: 4.21 MIL/uL — ABNORMAL LOW (ref 4.22–5.81)
RDW: 12.6 % (ref 11.5–15.5)
WBC: 9.4 K/uL (ref 4.0–10.5)
nRBC: 0 % (ref 0.0–0.2)

## 2023-10-23 LAB — BASIC METABOLIC PANEL WITH GFR
Anion gap: 8 (ref 5–15)
BUN: 10 mg/dL (ref 6–20)
CO2: 27 mmol/L (ref 22–32)
Calcium: 9.1 mg/dL (ref 8.9–10.3)
Chloride: 101 mmol/L (ref 98–111)
Creatinine, Ser: 0.92 mg/dL (ref 0.61–1.24)
GFR, Estimated: >60 mL/min (ref >60)
Glucose, Bld: 88 mg/dL (ref 70–99)
Potassium: 3.7 mmol/L (ref 3.5–5.1)
Sodium: 136 mmol/L (ref 135–145)

## 2023-10-23 LAB — MAGNESIUM: Magnesium: 2.6 mg/dL — ABNORMAL HIGH (ref 1.7–2.4)

## 2023-10-23 LAB — PHOSPHORUS: Phosphorus: 3.5 mg/dL (ref 2.5–4.6)

## 2023-10-23 MED ORDER — CYANOCOBALAMIN 1000 MCG/ML IJ SOLN
1000.0000 ug | Freq: Every day | INTRAMUSCULAR | Status: DC
  Administered 2023-10-23 – 2023-10-24 (×2): 1000 ug via INTRAMUSCULAR
  Filled 2023-10-23 (×3): qty 1

## 2023-10-23 MED ORDER — VITAMIN D (ERGOCALCIFEROL) 1.25 MG (50000 UNIT) PO CAPS
50000.0000 [IU] | ORAL_CAPSULE | ORAL | Status: DC
  Administered 2023-10-23: 50000 [IU] via ORAL
  Filled 2023-10-23: qty 1

## 2023-10-23 MED ORDER — VITAMIN B-12 1000 MCG PO TABS: 1000.0000 ug | ORAL_TABLET | Freq: Every day | ORAL | Status: DC

## 2023-10-23 MED ORDER — PHENOBARBITAL 32.4 MG PO TABS
64.8000 mg | ORAL_TABLET | Freq: Two times a day (BID) | ORAL | Status: AC
  Administered 2023-10-23 – 2023-10-24 (×2): 64.8 mg via ORAL
  Filled 2023-10-23 (×3): qty 2
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
## 2023-10-23 NOTE — Progress Notes (Signed)
 PHARMACY CONSULT NOTE - ELECTROLYTES  Pharmacy Consult for Electrolyte Monitoring and Replacement   Recent Labs: Height: 6' (182.9 cm) Weight: 87.1 kg (192 lb 0.3 oz) IBW/kg (Calculated) : 77.6 Estimated Creatinine Clearance: 118.3 mL/min (by C-G formula based on SCr of 0.92 mg/dL). Potassium (mmol/L)  Date Value  10/23/2023 3.7  06/13/2014 3.7   Magnesium  (mg/dL)  Date Value  90/91/7974 2.6 (H)   Calcium  (mg/dL)  Date Value  90/91/7974 9.1   Calcium , Total (mg/dL)  Date Value  95/70/7983 8.4 (L)   Albumin (g/dL)  Date Value  90/92/7974 3.7  08/29/2022 4.9  06/13/2014 4.6   Phosphorus (mg/dL)  Date Value  90/91/7974 3.5   Sodium (mmol/L)  Date Value  10/23/2023 136  08/29/2022 138  06/13/2014 138   Assessment  Martin Garcia is a 38 y.o. male presenting with seizure. PMH significant for HTN and history of small bowel obstruction. Pharmacy has been consulted to monitor and replace electrolytes.  Diet: NPO MIVF: N/A Pertinent medications: N/A  Goal of Therapy: Electrolytes WNL  Plan:  Electrolytes are within normal limits, no replacement warranted at this time Pharmacy will sign off at this time given electrolytes are stable and patient is no longer in ICU Please consult pharmacy again if needed  Thank you for allowing pharmacy to be a part of this patient's care.  Lum Mania, PharmD, BCPS 10/23/2023 7:20 AM

## 2023-10-23 NOTE — Progress Notes (Signed)
 [facility] Progress Note  Patient: Martin Garcia    FMW:[id number]  DOA: 10/20/2023     Date of Service: the patient was seen and examined on 10/23/2023  Chief Complaint  Patient presents with   Seizures   Brief hospital course: 38 y.o male with significant PMHx EtOH abuse drinks about 1-1-1/2 pints/day, EtOH withdrawal and hypertension of who presented to the ED with chief complaints of possible seizure like activity   Upon reviewing the chart, the patient was evalauted at [facility] Emergency Department earlier today due to chief complaints of chest pain. The Emergency Department staff assessed the symptoms and found them more consistent with alcohol withdrawal. As a result, the patient was discharged with a Librium  taper and naltrexone . According to the patient's significant other, who is currently at the bedside, the patient picked his medication from the pharmacy and took a dose of Librium . He then went to the kitchen to get something to eat where significant other reported unsual sounds as if he was dry heaving. The significant other checked on him and found him sitting on the toilet vomiting. He was also noted to be having epsiodes of diarrhea. Per significant other, the patient started to exhibited erratic behavior before he slumped onto the floor. She is not sure if he was having a seizure so she called EMS. The significant other confirmed that he did not hit his head. When EMS arrived, the patient was very combative and aggressive, necessitating sedation with 5 mg of IM Versed .   ED Course: Initial vital signs showed HR of 94 beats/minute, BP 136/77 mm Hg, the RR 20s breaths/minute, and the oxygen saturation 97% on room air and a temperature of 97.34F (36.5C).  Pertinent Labs/Diagnostics Findings: Na+/ K+: 138/3.2 WBC: 13.8 K/L  Lipase 104 CO2 18 AG 18 Ethanol 85 PCT: negative <0.10  Lactic acid: 3.8 CTA Chest> CT Abd/pelvis> negative Patient placed on CIWA per protocol.  PCCM  consulted for admission  9/5: Admitted to ICU with suspected EtOH withdrawal seizures 9/6 ICU needs resolving 9/7 transferred to [facility] service  Assessment and Plan:  # Acute ETOH intoxication with EtOH Abuse  High Risk for Withdrawal Average Drinks Per Day 1.5 pints of Liquor/day -EtOH 85 -Utox positive barbiturate, benzo and methadone  - Continue phenobarb tapering dose started in ICU  -CIWA per protocol -Follow CMP, INR, Daily BMP+Mg -Daily Thiamine , Folate, MVI once tolerating PO -SW consult for cessation resources   # Seizures  Unclear per significant other if the erratic behavior was seizure. High risk for EtOH withdrawal seizures although low suspicion given ethanol level 85 -Suspect symptoms likely due to mixing Librium  with alcohol -Seizure precaution -Lorazepam  PRN for breakthrough seizure     # Hypokalemia AGMA with Lactic Acidosis due to ETOH abuse -Trend Lactate -Monitor I&O's / urinary output -Follow BMP -Replace electrolytes as indicated   # Diarrhea Likely side effects of mixing Librium  and alcohol -GI panel negative -C. difficile negative -CT abdominal pelvis negative -Lipase slightly elevated -Continue IV fluids hydration 9/7 started probiotics twice daily  GERD PPI 40 mg IV BID x 2 doses, followed by oral twice daily  Vitamin B12 level 230, goal is >400.  Started vitamin B12 1000 mcg IM injection during hospital stay followed by oral supplement.  Follow with PCP repeat B12 level after 3 to 6 months.  Vitamin D  level 32, at lower end, started vitamin D  50,000 units weekly to prevent deficiency.  Follow-up with PCP to repeat vitamin D  level after she  is   # Depression and anxiety Psych consulted, patient will be admitted as an inpatient psych voluntarily. Patient will be evaluated tomorrow again by psych, if stable then probably will be discharged to psych   Body mass index is 26.04 kg/m.  Interventions:  Diet: Regular diet DVT Prophylaxis:  Subcutaneous Lovenox    Advance goals of care discussion: Full code  Family Communication: family was present at bedside, at the time of interview.  The pt provided permission to discuss medical plan with the family. Opportunity was given to ask question and all questions were answered satisfactorily.   Disposition:  Pt is from home, admitted with EtOH withdrawal, still has withdrawal symptoms, which precludes a safe discharge. Discharge to home, when stable, may need few days to improve.  Subjective: No significant events overnight, complaining of generalized body ache 6/10, complaining of off-and-on abdominal pain in the epigastric area which is sharp but not constant. Denied any nausea or vomiting today.   Physical Exam: General: NAD, lying comfortably Appear in no distress, affect appropriate Eyes: PERRLA ENT: Oral Mucosa Clear, moist  Neck: no JVD,  Cardiovascular: S1 and S2 Present, no Murmur,  Respiratory: good respiratory effort, Bilateral Air entry equal and Decreased, no Crackles, no wheezes Abdomen: BS present, Soft and mild epigastric tenderness,  Skin: no rashes Extremities: no Pedal edema, no calf tenderness Neurologic: without any new focal findings Gait not checked due to patient safety concerns  Vitals:   10/23/23 0452 10/23/23 0522 10/23/23 0924 10/23/23 1133  BP:  (!) 122/95 (!) 135/106 (!) 140/95  Pulse:  86 98 (!) 101  Resp:  18 18 18   Temp:  97.8 F (36.6 C) (!) 97.4 F (36.3 C)   TempSrc:  Oral    SpO2:  99% 98% 100%  Weight: 87.1 kg     Height:        Intake/Output Summary (Last 24 hours) at 10/23/2023 1545 Last data filed at 10/23/2023 0900 Gross per 24 hour  Intake 240 ml  Output 400 ml  Net -160 ml   Filed Weights   10/21/23 0500 10/22/23 0400 10/23/23 0452  Weight: 82.1 kg 83 kg 87.1 kg    Data Reviewed: I have personally reviewed and interpreted daily labs, tele strips, imagings as discussed above. I reviewed all nursing notes, pharmacy  notes, vitals, pertinent old records I have discussed plan of care as described above with RN and patient/family.  CBC: Recent Labs  Lab 10/20/23 1928 10/20/23 2007 10/21/23 0356 10/22/23 0304 10/23/23 0337  WBC 3.7* 13.8* 17.0* 15.2* 9.4  HGB 4.4* 14.5 15.7 13.8 13.2  HCT 13.2* 42.2 45.6 39.9 38.3*  MCV 96.4 89.8 89.1 89.7 91.0  PLT 121* 366 388 308 270   Basic Metabolic Panel: Recent Labs  Lab 10/20/23 2100 10/21/23 0356 10/22/23 0304 10/23/23 0337  NA 138 135 136 136  K 3.2* 3.7 3.6 3.7  CL 102 105 107 101  CO2 18* 15* 18* 27  GLUCOSE 159* 188* 127* 88  BUN 11 14 21* 10  CREATININE 0.97 1.10 0.98 0.92  CALCIUM  9.8 9.4 9.0 9.1  MG  --  2.1 2.5* 2.6*  PHOS  --  2.7 3.3 3.5    Studies: No results found.  Scheduled Meds:  cyanocobalamin   1,000 mcg Intramuscular Q1200   Followed by   NOREEN ON 10/30/2023] vitamin B-12  1,000 mcg Oral Daily   enoxaparin  (LOVENOX ) injection  50 mg Subcutaneous Q24H   folic acid   1 mg Oral Daily  methadone   115 mg Oral Daily   multivitamin with minerals  1 tablet Oral Daily   nicotine   21 mg Transdermal Daily   pantoprazole   40 mg Oral BID   [START ON 10/25/2023] phenobarbital   64.8 mg Oral QHS   phenobarbital   64.8 mg Oral BID   saccharomyces boulardii  250 mg Oral BID   thiamine   100 mg Oral Daily   Or   thiamine   100 mg Intravenous Daily   Continuous Infusions: PRN Meds: acetaminophen , docusate sodium , hydrOXYzine , loperamide , LORazepam  **OR** LORazepam , ondansetron , oxyCODONE , polyethylene glycol  Time spent: 55 minutes  Author: ELVAN Garcia. MD [facility] 10/23/2023 3:45 PM  To reach On-call, see care teams to locate the attending and reach out to them via [email or web address]. If 7PM-7AM, please contact night-coverage If you still have difficulty reaching the attending provider, please page the [facility] (Director on Call) for [facility] on amion for assistance.

## 2023-10-24 DIAGNOSIS — F10132 Alcohol abuse with withdrawal with perceptual disturbance: Secondary | ICD-10-CM | POA: Diagnosis not present

## 2023-10-24 LAB — CBC
HCT: 35.5 % — ABNORMAL LOW (ref 39.0–52.0)
Hemoglobin: 11.9 g/dL — ABNORMAL LOW (ref 13.0–17.0)
MCH: 30.9 pg (ref 26.0–34.0)
MCHC: 33.5 g/dL (ref 30.0–36.0)
MCV: 92.2 fL (ref 80.0–100.0)
Platelets: 235 K/uL (ref 150–400)
RBC: 3.85 MIL/uL — ABNORMAL LOW (ref 4.22–5.81)
RDW: 12.5 % (ref 11.5–15.5)
WBC: 5.6 K/uL (ref 4.0–10.5)
nRBC: 0 % (ref 0.0–0.2)

## 2023-10-24 LAB — MAGNESIUM: Magnesium: 2.3 mg/dL (ref 1.7–2.4)

## 2023-10-24 LAB — BASIC METABOLIC PANEL WITH GFR
Anion gap: 13 (ref 5–15)
BUN: 9 mg/dL (ref 6–20)
CO2: 26 mmol/L (ref 22–32)
Calcium: 9.1 mg/dL (ref 8.9–10.3)
Chloride: 100 mmol/L (ref 98–111)
Creatinine, Ser: 0.76 mg/dL (ref 0.61–1.24)
GFR, Estimated: >60 mL/min (ref >60)
Glucose, Bld: 109 mg/dL — ABNORMAL HIGH (ref 70–99)
Potassium: 3.9 mmol/L (ref 3.5–5.1)
Sodium: 139 mmol/L (ref 135–145)

## 2023-10-24 LAB — PHOSPHORUS: Phosphorus: 4.5 mg/dL (ref 2.5–4.6)

## 2023-10-24 MED ORDER — SERTRALINE HCL 25 MG PO TABS: 25.0000 mg | ORAL_TABLET | Freq: Every day | ORAL | 0 refills | Status: DC

## 2023-10-24 MED ORDER — SERTRALINE HCL 50 MG PO TABS
25.0000 mg | ORAL_TABLET | Freq: Every day | ORAL | Status: AC
  Administered 2023-10-24: 25 mg via ORAL
  Filled 2023-10-24: qty 1

## 2023-10-24 NOTE — Plan of Care (Signed)

## 2023-10-24 NOTE — Progress Notes (Signed)
 [facility] Progress Note  Patient: Martin Garcia    FMW:[id number]  DOA: 10/20/2023     Date of Service: the patient was seen and examined on 10/24/2023  Chief Complaint  Patient presents with   Seizures   Brief hospital course: 38 y.o male with significant PMHx EtOH abuse drinks about 1-1-1/2 pints/day, EtOH withdrawal and hypertension of who presented to the ED with chief complaints of possible seizure like activity   Upon reviewing the chart, the patient was evalauted at [facility] Emergency Department earlier today due to chief complaints of chest pain. The Emergency Department staff assessed the symptoms and found them more consistent with alcohol withdrawal. As a result, the patient was discharged with a Librium  taper and naltrexone . According to the patient's significant other, who is currently at the bedside, the patient picked his medication from the pharmacy and took a dose of Librium . He then went to the kitchen to get something to eat where significant other reported unsual sounds as if he was dry heaving. The significant other checked on him and found him sitting on the toilet vomiting. He was also noted to be having epsiodes of diarrhea. Per significant other, the patient started to exhibited erratic behavior before he slumped onto the floor. She is not sure if he was having a seizure so she called EMS. The significant other confirmed that he did not hit his head. When EMS arrived, the patient was very combative and aggressive, necessitating sedation with 5 mg of IM Versed .   ED Course: Initial vital signs showed HR of 94 beats/minute, BP 136/77 mm Hg, the RR 20s breaths/minute, and the oxygen saturation 97% on room air and a temperature of 97.12F (36.5C).  Pertinent Labs/Diagnostics Findings: Na+/ K+: 138/3.2 WBC: 13.8 K/L  Lipase 104 CO2 18 AG 18 Ethanol 85 PCT: negative <0.10  Lactic acid: 3.8 CTA Chest> CT Abd/pelvis> negative Patient placed on CIWA per protocol.  PCCM  consulted for admission  9/5: Admitted to ICU with suspected EtOH withdrawal seizures 9/6 ICU needs resolving 9/7 transferred to [facility] service  Assessment and Plan:  # Acute ETOH intoxication with EtOH Abuse  High Risk for Withdrawal Average Drinks Per Day 1.5 pints of Liquor/day -EtOH 85 -Utox positive barbiturate, benzo and methadone  - Continue phenobarb tapering dose started in ICU  -CIWA per protocol -Follow CMP, INR, Daily BMP+Mg -Daily Thiamine , Folate, MVI once tolerating PO -SW consult for cessation resources   # Seizures  Unclear per significant other if the erratic behavior was seizure. High risk for EtOH withdrawal seizures although low suspicion given ethanol level 85 -Suspect symptoms likely due to mixing Librium  with alcohol -Seizure precaution -Lorazepam  PRN for breakthrough seizure     # Hypokalemia: Resolved  AGMA with Lactic Acidosis due to ETOH abuse -Trend Lactate -Monitor I&O's / urinary output -Follow BMP -Replace electrolytes as indicated   # Diarrhea Likely side effects of mixing Librium  and alcohol -GI panel negative -C. difficile negative -CT abdominal pelvis negative -Lipase slightly elevated -Continue IV fluids hydration 9/7 started probiotics twice daily  GERD PPI 40 mg IV BID x 2 doses, followed by oral twice daily  Vitamin B12 level 230, goal is >400.  Started vitamin B12 1000 mcg IM injection during hospital stay followed by oral supplement.  Follow with PCP repeat B12 level after 3 to 6 months.  Vitamin D  level 32, at lower end, started vitamin D  50,000 units weekly to prevent deficiency.  Follow-up with PCP to repeat vitamin D  level  after she is   # Depression and anxiety Psych consulted, patient does not want any inpatient treatment would like to follow-up as an outpatient.  [facility] resources information was given to the patient by psych. 9/9 started Zoloft  25 mg p.o. daily   Body mass index is 24.79 kg/m.  Interventions:  Diet:  Regular diet DVT Prophylaxis: Subcutaneous Lovenox    Advance goals of care discussion: Full code  Family Communication: family was present at bedside, at the time of interview.  The pt provided permission to discuss medical plan with the family. Opportunity was given to ask question and all questions were answered satisfactorily.   Disposition:  Pt is from home, admitted with EtOH withdrawal, still has withdrawal symptoms, which precludes a safe discharge. Discharge to home, when stable, most likely discharge tomorrow   Subjective: No significant events overnight, c/o off-and-on pain, same like yesterday, does not feel any improvement.   Physical Exam: General: NAD, lying comfortably Appear in no distress, affect appropriate Eyes: PERRLA ENT: Oral Mucosa Clear, moist  Neck: no JVD,  Cardiovascular: S1 and S2 Present, no Murmur,  Respiratory: good respiratory effort, Bilateral Air entry equal and Decreased, no Crackles, no wheezes Abdomen: BS present, Soft and mild epigastric tenderness,  Skin: no rashes Extremities: no Pedal edema, no calf tenderness Neurologic: without any new focal findings Gait not checked due to patient safety concerns  Vitals:   10/24/23 0405 10/24/23 0500 10/24/23 0731 10/24/23 1344  BP: 100/76  (!) 130/98 (!) 138/103  Pulse: 82  89 (!) 105  Resp: 18  16 16   Temp: 97.6 F (36.4 C)  98.4 F (36.9 C) 99.2 F (37.3 C)  TempSrc: Oral  Oral   SpO2: 94%  100% 98%  Weight:  82.9 kg    Height:        Intake/Output Summary (Last 24 hours) at 10/24/2023 1522 Last data filed at 10/24/2023 1300 Gross per 24 hour  Intake 720 ml  Output --  Net 720 ml   Filed Weights   10/22/23 0400 10/23/23 0452 10/24/23 0500  Weight: 83 kg 87.1 kg 82.9 kg    Data Reviewed: I have personally reviewed and interpreted daily labs, tele strips, imagings as discussed above. I reviewed all nursing notes, pharmacy notes, vitals, pertinent old records I have discussed plan of  care as described above with RN and patient/family.  CBC: Recent Labs  Lab 10/20/23 2007 10/21/23 0356 10/22/23 0304 10/23/23 0337 10/24/23 0711  WBC 13.8* 17.0* 15.2* 9.4 5.6  HGB 14.5 15.7 13.8 13.2 11.9*  HCT 42.2 45.6 39.9 38.3* 35.5*  MCV 89.8 89.1 89.7 91.0 92.2  PLT 366 388 308 270 235   Basic Metabolic Panel: Recent Labs  Lab 10/20/23 2100 10/21/23 0356 10/22/23 0304 10/23/23 0337 10/24/23 0711  NA 138 135 136 136 139  K 3.2* 3.7 3.6 3.7 3.9  CL 102 105 107 101 100  CO2 18* 15* 18* 27 26  GLUCOSE 159* 188* 127* 88 109*  BUN 11 14 21* 10 9  CREATININE 0.97 1.10 0.98 0.92 0.76  CALCIUM  9.8 9.4 9.0 9.1 9.1  MG  --  2.1 2.5* 2.6* 2.3  PHOS  --  2.7 3.3 3.5 4.5    Studies: No results found.  Scheduled Meds:  cyanocobalamin   1,000 mcg Intramuscular Q1200   Followed by   NOREEN ON 10/30/2023] vitamin B-12  1,000 mcg Oral Daily   enoxaparin  (LOVENOX ) injection  50 mg Subcutaneous Q24H   folic acid   1 mg  Oral Daily   methadone   115 mg Oral Daily   multivitamin with minerals  1 tablet Oral Daily   nicotine   21 mg Transdermal Daily   pantoprazole   40 mg Oral BID   [START ON 10/25/2023] phenobarbital   64.8 mg Oral QHS   phenobarbital   64.8 mg Oral BID   saccharomyces boulardii  250 mg Oral BID   sertraline   25 mg Oral Daily   thiamine   100 mg Oral Daily   Or   thiamine   100 mg Intravenous Daily   Vitamin D  (Ergocalciferol )  50,000 Units Oral Q7 days   Continuous Infusions: PRN Meds: acetaminophen , docusate sodium , hydrOXYzine , loperamide , ondansetron , oxyCODONE , polyethylene glycol  Time spent: 40 minutes  Author: ELVAN Garcia. MD [facility] 10/24/2023 3:22 PM  To reach On-call, see care teams to locate the attending and reach out to them via [email or web address]. If 7PM-7AM, please contact night-coverage If you still have difficulty reaching the attending provider, please page the [facility] (Director on Call) for [facility] on amion for assistance.
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
## 2023-10-24 NOTE — TOC CM/SW Note (Signed)
..  Transition of Care [facility]) - Inpatient Brief Assessment   Patient Details  Name: ADRION MENZ MRN: [id number] Date of Birth: January 05, 1986  Transition of Care [facility]) CM/SW Contact:    Edsel DELENA Fischer, LCSW Phone Number: 10/24/2023, 3:51 PM   Clinical Narrative:  TOC received message from [facility]- LaKisha Turner-Herndon.  SW forward message to SW- Antion Vicci to follow up regarding questions moving forward  Transition of Care Asessment:

## 2023-10-25 ENCOUNTER — Other Ambulatory Visit: Payer: Self-pay

## 2023-10-25 DIAGNOSIS — F10132 Alcohol abuse with withdrawal with perceptual disturbance: Secondary | ICD-10-CM | POA: Diagnosis not present

## 2023-10-25 LAB — BASIC METABOLIC PANEL WITH GFR
Anion gap: 7 (ref 5–15)
BUN: 13 mg/dL (ref 6–20)
CO2: 29 mmol/L (ref 22–32)
Calcium: 9.2 mg/dL (ref 8.9–10.3)
Chloride: 103 mmol/L (ref 98–111)
Creatinine, Ser: 0.82 mg/dL (ref 0.61–1.24)
GFR, Estimated: >60 mL/min (ref >60)
Glucose, Bld: 106 mg/dL — ABNORMAL HIGH (ref 70–99)
Potassium: 4.6 mmol/L (ref 3.5–5.1)
Sodium: 139 mmol/L (ref 135–145)

## 2023-10-25 LAB — IRON AND TIBC
Iron: 41 ug/dL — ABNORMAL LOW (ref 45–182)
Saturation Ratios: 12 % — ABNORMAL LOW (ref 17.9–39.5)
TIBC: 339 ug/dL (ref 250–450)
UIBC: 298 ug/dL

## 2023-10-25 LAB — MAGNESIUM: Magnesium: 2.4 mg/dL (ref 1.7–2.4)

## 2023-10-25 LAB — CBC
HCT: 33.8 % — ABNORMAL LOW (ref 39.0–52.0)
Hemoglobin: 11.2 g/dL — ABNORMAL LOW (ref 13.0–17.0)
MCH: 30.5 pg (ref 26.0–34.0)
MCHC: 33.1 g/dL (ref 30.0–36.0)
MCV: 92.1 fL (ref 80.0–100.0)
Platelets: 247 K/uL (ref 150–400)
RBC: 3.67 MIL/uL — ABNORMAL LOW (ref 4.22–5.81)
RDW: 12.4 % (ref 11.5–15.5)
WBC: 5.7 K/uL (ref 4.0–10.5)
nRBC: 0 % (ref 0.0–0.2)

## 2023-10-25 LAB — PHOSPHORUS: Phosphorus: 5 mg/dL — ABNORMAL HIGH (ref 2.5–4.6)

## 2023-10-25 LAB — FOLATE: Folate: 10.8 ng/mL (ref >5.9)

## 2023-10-25 MED ORDER — PANTOPRAZOLE SODIUM 40 MG PO TBEC
40.0000 mg | DELAYED_RELEASE_TABLET | Freq: Every day | ORAL | 0 refills | Status: DC
  Filled 2023-10-25: qty 10, 10d supply, fill #0

## 2023-10-25 MED ORDER — VITAMIN D (ERGOCALCIFEROL) 1.25 MG (50000 UNIT) PO CAPS
50000.0000 [IU] | ORAL_CAPSULE | ORAL | 0 refills | Status: AC
  Filled 2023-10-25: qty 12, 84d supply, fill #0

## 2023-10-25 MED ORDER — NALTREXONE HCL 50 MG PO TABS
50.0000 mg | ORAL_TABLET | Freq: Every day | ORAL | 1 refills | Status: AC
  Filled 2023-10-25: qty 30, 30d supply, fill #0

## 2023-10-25 MED ORDER — SERTRALINE HCL 25 MG PO TABS
25.0000 mg | ORAL_TABLET | Freq: Every day | ORAL | 2 refills | Status: DC
  Filled 2023-10-25: qty 30, 30d supply, fill #0

## 2023-10-25 MED ORDER — LISINOPRIL 20 MG PO TABS
20.0000 mg | ORAL_TABLET | Freq: Every day | ORAL | 2 refills | Status: AC
  Filled 2023-10-25: qty 30, 30d supply, fill #0

## 2023-10-25 MED ORDER — VITAMIN B-1 100 MG PO TABS
100.0000 mg | ORAL_TABLET | Freq: Every day | ORAL | 0 refills | Status: DC
  Filled 2023-10-25: qty 30, 30d supply, fill #0

## 2023-10-25 MED ORDER — CYANOCOBALAMIN 1000 MCG PO TABS
1000.0000 ug | ORAL_TABLET | Freq: Every day | ORAL | 2 refills | Status: DC
  Filled 2023-10-25: qty 30, 30d supply, fill #0

## 2023-10-25 MED ORDER — SERTRALINE HCL 50 MG PO TABS
25.0000 mg | ORAL_TABLET | Freq: Every day | ORAL | Status: DC
  Administered 2023-10-25: 25 mg via ORAL
  Filled 2023-10-25: qty 1

## 2023-10-25 NOTE — Discharge Summary (Signed)
 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 Follow-up on pending iron profile and folate level Follow up LABS/TEST:  Repeat B12 and vitamin D  level after 3 to 6 months.   Follow-up Information     Melvin Pao, NP Follow up.   Specialty: Nurse Practitioner Why: hospital follow up Contact information: [address] [place] [address] [phone]         [facility], [facility] Follow up in 1 week(s).   Contact information: [address] [place] [address] [phone]                Diet recommendation: Cardiac diet  Activity: The patient is advised to gradually reintroduce usual activities, as tolerated  Discharge Condition: stable  Code Status: Full code   History of present illness: As per the H and P dictated on admission.  Hospital Course:  38 y.o male with significant PMHx EtOH abuse drinks about 1-1-1/2 pints/day, EtOH withdrawal and hypertension of who presented to the ED with chief complaints of possible seizure like activity   Upon reviewing the chart, the patient was evalauted at [facility] Emergency Department earlier today due to chief complaints of chest pain. The Emergency Department staff assessed the symptoms and found them more consistent with alcohol withdrawal. As a result, the patient was discharged with a Librium  taper and naltrexone . According to the patient's  significant other, who is currently at the bedside, the patient picked his medication from the pharmacy and took a dose of Librium . He then went to the kitchen to get something to eat where significant other reported unsual sounds as if he was dry heaving. The significant other checked on him and found him sitting on the toilet vomiting. He was also noted to be having epsiodes of diarrhea. Per significant other, the patient started to exhibited erratic behavior before he slumped onto the floor. She is not sure if he was having a seizure so she called EMS. The significant other confirmed that he did not hit his head. When EMS arrived, the patient was very combative and aggressive, necessitating sedation with 5 mg of IM Versed .   ED Course: Initial vital signs showed HR of 94 beats/minute, BP 136/77 mm Hg, the RR 20s breaths/minute, and the oxygen saturation 97% on room air and a temperature of 97.1F (36.5C).  Pertinent Labs/Diagnostics Findings: Na+/ K+: 138/3.2 WBC: 13.8 K/L  Lipase 104 CO2 18 AG 18 Ethanol 85 PCT: negative <0.10  Lactic acid: 3.8 CTA Chest> CT Abd/pelvis> negative Patient placed on CIWA per protocol.  PCCM consulted for admission   9/5: Admitted to ICU with suspected EtOH withdrawal seizures 9/6 ICU needs resolving 9/7 transferred to [facility] service   Assessment and Plan:   # Acute ETOH intoxication with EtOH Abuse  Pt was at High Risk for Withdrawal Average Drinks Per Day 1.5 pints of Liquor/day -EtOH 85 -Utox positive barbiturate, benzo and methadone  - s/p phenobarb tapering dose started  in ICU  -s/p CIWA per protocol -s/p Daily Thiamine , Folate, MVI  -SW consult for cessation resources 9/10 stable for discharge today.  Resumed naltrexone  50 mg p.o. daily.  Continue thiamine  100 mg p.o. daily for 30 days.   # Seizures: Unclear per significant other if the erratic behavior was seizure. High risk for EtOH withdrawal seizures although low suspicion given ethanol level  85 -Suspect symptoms likely due to mixing Librium  with alcohol S/p Lorazepam  PRN for breakthrough seizure No more seizures observed during hospital stay.  Recommend to follow with PCP in 1 week.   # Hypokalemia: Resolved  AGMA with Lactic Acidosis due to ETOH abuse   # Diarrhea: Likely side effects of mixing Librium  and alcohol -GI panel negative and C. difficile negative -CT abdominal pelvis negative -Lipase slightly elevated -s/p IV fluids hydration and probiotics. Diarrhea resolved.   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 twice daily.  Discharged on pantoprazole  40 mg p.o. daily for 10 days.  Follow with PCP for further management as an outpatient  # Vitamin B12 level 230, goal is >400.  Started vitamin B12 1000 mcg IM injection during hospital stay followed by oral supplement.  Follow with PCP repeat B12 level after 3 to 6 months.   Vitamin D  level 32, at lower end, started vitamin D  50,000 units weekly to prevent deficiency.  Follow-up with PCP to repeat vitamin D  level after she is   # Depression and anxiety Psych consulted, patient does not want any inpatient treatment would like to follow-up as an outpatient.  [facility] resources information was given to the patient by psych. 9/9 started Zoloft  25 mg p.o. daily   Body mass index is 24.79 kg/m.  Nutrition Interventions:  - Patient was instructed, not to drive, operate heavy machinery, perform activities at heights, swimming or participation in water activities or provide baby sitting services while on Pain, Sleep and Anxiety Medications; until his outpatient Physician has advised to do so again.  - Also recommended to not to take more than prescribed Pain, Sleep and Anxiety Medications.  Patient was ambulatory without any assistance. On the day of the discharge the patient's vitals were stable, and no other acute medical condition were reported by patient. the patient was felt safe to be discharge at  Home.  Consultants: PCCM, psychiatry Procedures: None  Discharge Exam: General: Appear in no distress, Oral Mucosa Clear, moist. Cardiovascular: S1 and S2 Present, no Murmur, Respiratory: normal respiratory effort, Bilateral Air entry present and no Crackles, no wheezes Abdomen: Bowel Sound present, Soft and no tenderness. Extremities: no Pedal edema, no calf tenderness Neurology: alert and oriented to time, place, and person affect appropriate.  Filed Weights   10/22/23 0400 10/23/23 0452 10/24/23 0500  Weight: 83 kg 87.1 kg 82.9 kg   Vitals:   10/25/23 0153 10/25/23 0803  BP: 114/82 (!) 129/100  Pulse: 87 94  Resp: 16 17  Temp: 97.9 F (36.6 C) 98.2 F (36.8 C)  SpO2: 97% 100%    DISCHARGE MEDICATION: Allergies as of 10/25/2023       Reactions   Tramadol Nausea And Vomiting        Medication List     STOP taking these medications    chlordiazePOXIDE  25 MG capsule Commonly known as: LIBRIUM    gabapentin  600 MG tablet Commonly known as: Neurontin        TAKE these medications    cyanocobalamin  1000 MCG tablet Take 1 tablet (1,000 mcg total) by  mouth daily. Start taking on: October 30, 2023   lisinopril  20 MG tablet Commonly known as: ZESTRIL  Take 1 tablet (20 mg total) by mouth daily. Hold off if systolic BP less than 130 mmHg. What changed:  medication strength how much to take additional instructions   naltrexone  50 MG tablet Commonly known as: DEPADE Take 1 tablet (50 mg total) by mouth daily.   pantoprazole  40 MG tablet Commonly known as: PROTONIX  Take 1 tablet (40 mg total) by mouth daily for 10 days.   sertraline  25 MG tablet Commonly known as: ZOLOFT  Take 1 tablet (25 mg total) by mouth daily.   thiamine  100 MG tablet Commonly known as: Vitamin B-1 Take 1 tablet (100 mg total) by mouth daily. Start taking on: October 26, 2023   Vitamin D  (Ergocalciferol ) 1.25 MG (50000 UNIT) Caps capsule Commonly known as: DRISDOL  Take 1  capsule (50,000 Units total) by mouth every 7 (seven) days. Start taking on: October 30, 2023       Allergies  Allergen Reactions   Tramadol Nausea And Vomiting   Discharge Instructions     Call MD for:  difficulty breathing, headache or visual disturbances   Complete by: As directed    Call MD for:  extreme fatigue   Complete by: As directed    Call MD for:  persistant dizziness or light-headedness   Complete by: As directed    Call MD for:  persistant nausea and vomiting   Complete by: As directed    Call MD for:  severe uncontrolled pain   Complete by: As directed    Call MD for:  temperature >100.4   Complete by: As directed    Diet - low sodium heart healthy   Complete by: As directed    Discharge instructions   Complete by: As directed    Follow-up with PCP in 1 week Follow-up with psych in 1 week Decreased dose of lisinopril , blood pressure remained soft, patient was advised to restart lisinopril  if blood pressure remains high, hold off if systolic BP less than 130 mmHg. Repeat B12 and vitamin D  level after 3 to 6 months. Follow-up on pending iron profile and folate level.   Increase activity slowly   Complete by: As directed        The results of significant diagnostics from this hospitalization (including imaging, microbiology, ancillary and laboratory) are listed below for reference.    Significant Diagnostic Studies: CT CHEST ABDOMEN PELVIS WO CONTRAST Result Date: 10/20/2023 CLINICAL DATA:  Sepsis EXAM: CT CHEST, ABDOMEN AND PELVIS WITHOUT CONTRAST TECHNIQUE: Multidetector CT imaging of the chest, abdomen and pelvis was performed following the standard protocol without IV contrast. RADIATION DOSE REDUCTION: This exam was performed according to the departmental dose-optimization program which includes automated exposure control, adjustment of the mA and/or kV according to patient size and/or use of iterative reconstruction technique. COMPARISON:  04/26/2022  FINDINGS: CT CHEST FINDINGS Cardiovascular: Heart is normal size. Aorta is normal caliber. Mediastinum/Nodes: No mediastinal, hilar, or axillary adenopathy. Trachea and esophagus are unremarkable. Thyroid  unremarkable. Lungs/Pleura: Lungs are clear. No focal airspace opacities or suspicious nodules. No effusions. Musculoskeletal: Chest wall soft tissues are unremarkable. No acute bony abnormality. CT ABDOMEN PELVIS FINDINGS Hepatobiliary: No focal hepatic abnormality. Gallbladder unremarkable. Pancreas: No focal abnormality or ductal dilatation. Spleen: No focal abnormality.  Normal size. Adrenals/Urinary Tract: No adrenal abnormality. No focal renal abnormality. No stones or hydronephrosis. Urinary bladder is unremarkable. Stomach/Bowel: Normal appendix. Sigmoid diverticulosis. No active diverticulitis. Stomach and small  bowel decompressed, unremarkable. Vascular/Lymphatic: No evidence of aneurysm or adenopathy. Reproductive: No visible focal abnormality. Other: No free fluid or free air. Musculoskeletal: No acute bony abnormality. IMPRESSION: No acute findings in the chest, abdomen or pelvis. Electronically Signed   By: Franky Crease M.D.   On: 10/20/2023 23:20    Microbiology: Recent Results (from the past 240 hours)  MRSA Next Gen by PCR, Nasal     Status: None   Collection Time: 10/20/23 11:33 PM   Specimen: Nasal Mucosa; Nasal Swab  Result Value Ref Range Status   MRSA by PCR Next Gen NOT DETECTED NOT DETECTED Final    Comment: (NOTE) The GeneXpert MRSA Assay (FDA approved for NASAL specimens only), is one component of a comprehensive MRSA colonization surveillance program. It is not intended to diagnose MRSA infection nor to guide or monitor treatment for MRSA infections. Test performance is not FDA approved in patients less than 60 years old. Performed at [facility], [address] [place], [place] [address]   Gastrointestinal Panel by PCR , Stool     Status: None   Collection  Time: 10/20/23 11:34 PM   Specimen: Stool  Result Value Ref Range Status   Campylobacter species NOT DETECTED NOT DETECTED Final   Plesimonas shigelloides NOT DETECTED NOT DETECTED Final   Salmonella species NOT DETECTED NOT DETECTED Final   Yersinia enterocolitica NOT DETECTED NOT DETECTED Final   Vibrio species NOT DETECTED NOT DETECTED Final   Vibrio cholerae NOT DETECTED NOT DETECTED Final   Enteroaggregative E coli (EAEC) NOT DETECTED NOT DETECTED Final   Enteropathogenic E coli (EPEC) NOT DETECTED NOT DETECTED Final   Enterotoxigenic E coli (ETEC) NOT DETECTED NOT DETECTED Final   Shiga like toxin producing E coli (STEC) NOT DETECTED NOT DETECTED Final   Shigella/Enteroinvasive E coli (EIEC) NOT DETECTED NOT DETECTED Final   Cryptosporidium NOT DETECTED NOT DETECTED Final   Cyclospora cayetanensis NOT DETECTED NOT DETECTED Final   Entamoeba histolytica NOT DETECTED NOT DETECTED Final   Giardia lamblia NOT DETECTED NOT DETECTED Final   Adenovirus F40/41 NOT DETECTED NOT DETECTED Final   Astrovirus NOT DETECTED NOT DETECTED Final   Norovirus GI/GII NOT DETECTED NOT DETECTED Final   Rotavirus A NOT DETECTED NOT DETECTED Final   Sapovirus (I, II, IV, and V) NOT DETECTED NOT DETECTED Final    Comment: Performed at [facility], [address] [place], [place] [address]  C Difficile Quick Screen w PCR reflex     Status: None   Collection Time: 10/20/23 11:34 PM   Specimen: STOOL  Result Value Ref Range Status   C Diff antigen NEGATIVE NEGATIVE Final   C Diff toxin NEGATIVE NEGATIVE Final   C Diff interpretation No C. difficile detected.  Final    Comment: Performed at [facility], [address] [place], [place] [address]     Labs: CBC: Recent Labs  Lab 10/21/23 [id number] 10/22/23 0304 10/23/23 0337 10/24/23 0711 10/25/23 0349  WBC 17.0* 15.2* 9.4 5.6 5.7  HGB 15.7 13.8 13.2 11.9* 11.2*  HCT 45.6 39.9 38.3* 35.5* 33.8*  MCV 89.1 89.7 91.0 92.2 92.1   PLT 388 308 270 235 247   Basic Metabolic Panel: Recent Labs  Lab 10/21/23 0356 10/22/23 0304 10/23/23 0337 10/24/23 0711 10/25/23 0349  NA 135 136 136 139 139  K 3.7 3.6 3.7 3.9 4.6  CL 105 107 101 100 103  CO2 15* 18* 27 26 29   GLUCOSE 188* 127* 88 109*  106*  BUN 14 21* 10 9 13   CREATININE 1.10 0.98 0.92 0.76 0.82  CALCIUM  9.4 9.0 9.1 9.1 9.2  MG 2.1 2.5* 2.6* 2.3 2.4  PHOS 2.7 3.3 3.5 4.5 5.0*   Liver Function Tests: Recent Labs  Lab 10/20/23 2100 10/22/23 0304  AST 37  --   ALT 29  --   ALKPHOS 61  --   BILITOT 0.6  --   PROT 8.6*  --   ALBUMIN 4.5 3.7   Recent Labs  Lab 10/20/23 2201  LIPASE 104*   No results for input(s): AMMONIA in the last 168 hours. Cardiac Enzymes: No results for input(s): CKTOTAL, CKMB, CKMBINDEX, TROPONINI in the last 168 hours. BNP (last 3 results) No results for input(s): BNP in the last 8760 hours. CBG: Recent Labs  Lab 10/20/23 2316  GLUCAP 164*    Time spent: 35 minutes  Signed:  Elvan Sor  [facility] 10/25/2023 9:54 AM

## 2023-10-25 NOTE — Progress Notes (Signed)
 Patient discharging today. States he was going to do inpatient mental health, but when he was told they can hold you if they want to decided against it. Patient does remain motivated to pursue substance abuse treatment. Provided with additional list of treatment facilities to call. Encouarged to call nurse navigator with any questions or concerns.

## 2023-10-25 NOTE — Plan of Care (Signed)
  Problem: Education: Goal: Knowledge of General Education information will improve Description: Including pain rating scale, medication(s)/side effects and non-pharmacologic comfort measures Outcome: Progressing   Problem: Health Behavior/Discharge Planning: Goal: Ability to manage health-related needs will improve Outcome: Progressing   Problem: Activity: Goal: Risk for activity intolerance will decrease Outcome: Progressing   Problem: Elimination: Goal: Will not experience complications related to bowel motility Outcome: Progressing   Problem: Safety: Goal: Ability to remain free from injury will improve Outcome: Progressing   Problem: Skin Integrity: Goal: Risk for impaired skin integrity will decrease Outcome: Progressing   

## 2023-10-25 NOTE — Progress Notes (Signed)
 Discharge criteria met

## 2023-10-26 ENCOUNTER — Telehealth: Payer: Self-pay

## 2023-10-26 NOTE — Transitions of Care (Post Inpatient/ED Visit) (Unsigned)
   10/26/2023  Name: CLAUD GOWAN MRN: [id number] DOB: 12/28/1985  Today's TOC FU Call Status: Today's TOC FU Call Status:: Unsuccessful Call (1st Attempt) Unsuccessful Call (1st Attempt) Date: 10/26/23  Attempted to reach the patient regarding the most recent Inpatient/ED visit.  Follow Up Plan: Additional outreach attempts will be made to reach the patient to complete the Transitions of Care (Post Inpatient/ED visit) call.   Signature Julian Lemmings, LPN [facility] Nurse Health Advisor Direct Dial [phone]

## 2023-10-27 NOTE — Transitions of Care (Post Inpatient/ED Visit) (Signed)
   10/27/2023  Name: Martin Garcia MRN: [id number] DOB: 07-03-1985  Today's TOC FU Call Status: Today's TOC FU Call Status:: Successful TOC FU Call Completed Unsuccessful Call (1st Attempt) Date: 10/26/23 [facility] FU Call Complete Date: 10/27/23 Patient's Name and Date of Birth confirmed.  Transition Care Management Follow-up Telephone Call Date of Discharge: 10/25/23 Discharge Facility: [facility]) Type of Discharge: Inpatient Admission Primary Inpatient Discharge Diagnosis:: alcohol use How have you been since you were released from the hospital?: Better Any questions or concerns?: No  Items Reviewed: Did you receive and understand the discharge instructions provided?: Yes Medications obtained,verified, and reconciled?: Yes (Medications Reviewed) Any new allergies since your discharge?: No Dietary orders reviewed?: Yes Do you have support at home?: Yes People in Home [RPT]: spouse  Medications Reviewed Today: Medications Reviewed Today     Reviewed by Emmitt Pan, LPN (Licensed Practical Nurse) on 10/27/23 at 1104  Med List Status: <None>   Medication Order Taking? Sig Documenting Provider Last Dose Status Informant  cyanocobalamin  1000 MCG tablet [id number] Yes Take 1 tablet (1,000 mcg total) by mouth daily. Von Bellis, MD  Active   lisinopril  (ZESTRIL ) 20 MG tablet [id number] Yes Take 1 tablet (20 mg total) by mouth daily. Hold off if systolic BP less than 130 mmHg. Von Bellis, MD  Active   naltrexone  (DEPADE) 50 MG tablet [id number] Yes Take 1 tablet (50 mg total) by mouth daily. Von Bellis, MD  Active   pantoprazole  (PROTONIX ) 40 MG tablet [id number] Yes Take 1 tablet (40 mg total) by mouth daily for 10 days. Von Bellis, MD  Active   sertraline  (ZOLOFT ) 25 MG tablet [id number] Yes Take 1 tablet (25 mg total) by mouth daily. Von Bellis, MD  Active   thiamine  (VITAMIN B-1) 100 MG tablet [id number] Yes Take 1 tablet (100 mg total) by  mouth daily. Von Bellis, MD  Active   Vitamin D , Ergocalciferol , (DRISDOL ) 1.25 MG (50000 UNIT) CAPS capsule [id number] Yes Take 1 capsule (50,000 Units total) by mouth every 7 (seven) days. Von Bellis, MD  Active             Home Care and Equipment/Supplies: Were Home Health Services Ordered?: NA Any new equipment or medical supplies ordered?: NA  Functional Questionnaire: Do you need assistance with bathing/showering or dressing?: No Do you need assistance with meal preparation?: No Do you need assistance with eating?: No Do you have difficulty maintaining continence: No Do you need assistance with getting out of bed/getting out of a chair/moving?: No Do you have difficulty managing or taking your medications?: No  Follow up appointments reviewed: PCP Follow-up appointment confirmed?: Yes Date of PCP follow-up appointment?: 10/30/23 Follow-up Provider: [facility] Follow-up appointment confirmed?: NA Do you need transportation to your follow-up appointment?: No Do you understand care options if your condition(s) worsen?: Yes-patient verbalized understanding    SIGNATURE Pan Emmitt, LPN [facility] Nurse Health Advisor Direct Dial [phone]

## 2023-10-29 ENCOUNTER — Telehealth: Payer: Self-pay

## 2023-10-29 NOTE — Telephone Encounter (Signed)
 Ok for E2C2 to review.  Left message for patient. PCP will be out of office tomorrow 10/30/2023 and his appointment has been cancelled. He does already have a scheduled appointment for 11/01/2023 that I have asked him to be sure to attend.

## 2023-10-30 ENCOUNTER — Inpatient Hospital Stay: Payer: MEDICAID | Admitting: Nurse Practitioner

## 2023-10-31 ENCOUNTER — Other Ambulatory Visit: Payer: Self-pay | Admitting: Internal Medicine

## 2023-10-31 ENCOUNTER — Telehealth: Payer: Self-pay

## 2023-10-31 ENCOUNTER — Other Ambulatory Visit: Payer: Self-pay | Admitting: Nurse Practitioner

## 2023-10-31 MED ORDER — NICOTINE 21 MG/24HR TD PT24: 21.0000 mg | MEDICATED_PATCH | Freq: Every day | TRANSDERMAL | 0 refills | Status: AC

## 2023-10-31 MED ORDER — SERTRALINE HCL 25 MG PO TABS: 25.0000 mg | ORAL_TABLET | Freq: Every day | ORAL | 2 refills | Status: DC

## 2023-10-31 NOTE — Progress Notes (Unsigned)
 {  Select_TRH_Note:[id number]}

## 2023-11-01 ENCOUNTER — Ambulatory Visit: Payer: MEDICAID | Admitting: Nurse Practitioner

## 2023-11-01 ENCOUNTER — Other Ambulatory Visit: Payer: Self-pay | Admitting: Internal Medicine

## 2023-11-01 MED ORDER — SERTRALINE HCL 25 MG PO TABS: 25.0000 mg | ORAL_TABLET | Freq: Every day | ORAL | 2 refills | Status: DC

## 2023-11-01 NOTE — Progress Notes (Unsigned)
 {  Select_TRH_Note:[id number]}

## 2023-11-01 NOTE — Progress Notes (Deleted)
 There were no vitals taken for this visit.   Subjective:    Patient ID: Martin Garcia, male    DOB: 14-Sep-1985, 38 y.o.   MRN: [id number]  HPI: Martin Garcia is a 38 y.o. male  No chief complaint on file.  HYPERTENSION {Blank single:19197::without,with} Chronic Kidney Disease Hypertension status: {Blank single:19197::controlled,uncontrolled,better,worse,exacerbated,stable}  Satisfied with current treatment? {Blank single:19197::yes,no} Duration of hypertension: {Blank single:19197::chronic,months,years} BP monitoring frequency:  {Blank single:19197::not checking,rarely,daily,weekly,monthly,a few times a day,a few times a week,a few times a month} BP range:  BP medication side effects:  {Blank single:19197::yes,no} Medication compliance: {Blank single:19197::excellent compliance,good compliance,fair compliance,poor compliance} Previous BP meds:{Blank multiple:19196::none,amlodipine ,amlodipine /benazepril,atenolol,benazepril,benazepril/HCTZ,bisoprolol (bystolic),carvedilol,chlorthalidone,clonidine,diltiazem,exforge HCT,HCTZ,irbesartan (avapro),labetalol,lisinopril ,lisinopril -HCTZ,losartan (cozaar),methyldopa,nifedipine,olmesartan (benicar),olmesartan-HCTZ,quinapril,ramipril,spironalactone,tekturna,valsartan,valsartan-HCTZ,verapamil} Aspirin : {Blank single:19197::yes,no} Recurrent headaches: {Blank single:19197::yes,no} Visual changes: {Blank single:19197::yes,no} Palpitations: {Blank single:19197::yes,no} Dyspnea: {Blank single:19197::yes,no} Chest pain: {Blank single:19197::yes,no} Lower extremity edema: {Blank single:19197::yes,no} Dizzy/lightheaded: {Blank single:19197::yes,no}  MOOD/ALCOHOL USE Relevant past medical, surgical, family and social history reviewed and updated as indicated. Interim medical history since our last visit  reviewed. Allergies and medications reviewed and updated.  Review of Systems  Per HPI unless specifically indicated above     Objective:    There were no vitals taken for this visit.  Wt Readings from Last 3 Encounters:  10/24/23 182 lb 12.2 oz (82.9 kg)  07/26/23 181 lb 14.1 oz (82.5 kg)  08/29/22 181 lb 12.8 oz (82.5 kg)    Physical Exam  Results for orders placed or performed during the hospital encounter of 10/20/23  CBC   Collection Time: 10/20/23  7:28 PM  Result Value Ref Range   WBC 3.7 (L) 4.0 - 10.5 K/uL   RBC 1.37 (L) 4.22 - 5.81 MIL/uL   Hemoglobin 4.4 (LL) 13.0 - 17.0 g/dL   HCT 86.7 (LL) 60.9 - 52.0 %   MCV 96.4 80.0 - 100.0 fL   MCH 32.1 26.0 - 34.0 pg   MCHC 33.3 30.0 - 36.0 g/dL   RDW 87.2 88.4 - 84.4 %   Platelets 121 (L) 150 - 400 K/uL   nRBC 0.0 0.0 - 0.2 %  Ethanol   Collection Time: 10/20/23  7:28 PM  Result Value Ref Range   Alcohol, Ethyl (B) 85 (H) <15 mg/dL  CBC   Collection Time: 10/20/23  8:07 PM  Result Value Ref Range   WBC 13.8 (H) 4.0 - 10.5 K/uL   RBC 4.70 4.22 - 5.81 MIL/uL   Hemoglobin 14.5 13.0 - 17.0 g/dL   HCT 57.7 60.9 - 47.9 %   MCV 89.8 80.0 - 100.0 fL   MCH 30.9 26.0 - 34.0 pg   MCHC 34.4 30.0 - 36.0 g/dL   RDW 87.3 88.4 - 84.4 %   Platelets 366 150 - 400 K/uL   nRBC 0.0 0.0 - 0.2 %  Comprehensive metabolic panel with GFR   Collection Time: 10/20/23  9:00 PM  Result Value Ref Range   Sodium 138 135 - 145 mmol/L   Potassium 3.2 (L) 3.5 - 5.1 mmol/L   Chloride 102 98 - 111 mmol/L   CO2 18 (L) 22 - 32 mmol/L   Glucose, Bld 159 (H) 70 - 99 mg/dL   BUN 11 6 - 20 mg/dL   Creatinine, Ser 9.02 0.61 - 1.24 mg/dL   Calcium  9.8 8.9 - 10.3 mg/dL   Total Protein 8.6 (H) 6.5 - 8.1 g/dL   Albumin 4.5 3.5 - 5.0 g/dL   AST 37 15 - 41 U/L   ALT 29 0 - 44 U/L   Alkaline Phosphatase 61  38 - 126 U/L   Total Bilirubin 0.6 0.0 - 1.2 mg/dL   GFR, Estimated >39 >39 mL/min   Anion gap 18 (H) 5 - 15  Procalcitonin   Collection Time:  10/20/23 10:01 PM  Result Value Ref Range   Procalcitonin <0.10 ng/mL  Lactic acid, plasma   Collection Time: 10/20/23 10:01 PM  Result Value Ref Range   Lactic Acid, Venous 3.8 (HH) 0.5 - 1.9 mmol/L  Lipase, blood   Collection Time: 10/20/23 10:01 PM  Result Value Ref Range   Lipase 104 (H) 11 - 51 U/L  Glucose, capillary   Collection Time: 10/20/23 11:16 PM  Result Value Ref Range   Glucose-Capillary 164 (H) 70 - 99 mg/dL  MRSA Next Gen by PCR, Nasal   Collection Time: 10/20/23 11:33 PM   Specimen: Nasal Mucosa; Nasal Swab  Result Value Ref Range   MRSA by PCR Next Gen NOT DETECTED NOT DETECTED  Gastrointestinal Panel by PCR , Stool   Collection Time: 10/20/23 11:34 PM   Specimen: Stool  Result Value Ref Range   Campylobacter species NOT DETECTED NOT DETECTED   Plesimonas shigelloides NOT DETECTED NOT DETECTED   Salmonella species NOT DETECTED NOT DETECTED   Yersinia enterocolitica NOT DETECTED NOT DETECTED   Vibrio species NOT DETECTED NOT DETECTED   Vibrio cholerae NOT DETECTED NOT DETECTED   Enteroaggregative E coli (EAEC) NOT DETECTED NOT DETECTED   Enteropathogenic E coli (EPEC) NOT DETECTED NOT DETECTED   Enterotoxigenic E coli (ETEC) NOT DETECTED NOT DETECTED   Shiga like toxin producing E coli (STEC) NOT DETECTED NOT DETECTED   Shigella/Enteroinvasive E coli (EIEC) NOT DETECTED NOT DETECTED   Cryptosporidium NOT DETECTED NOT DETECTED   Cyclospora cayetanensis NOT DETECTED NOT DETECTED   Entamoeba histolytica NOT DETECTED NOT DETECTED   Giardia lamblia NOT DETECTED NOT DETECTED   Adenovirus F40/41 NOT DETECTED NOT DETECTED   Astrovirus NOT DETECTED NOT DETECTED   Norovirus GI/GII NOT DETECTED NOT DETECTED   Rotavirus A NOT DETECTED NOT DETECTED   Sapovirus (I, II, IV, and V) NOT DETECTED NOT DETECTED  C Difficile Quick Screen w PCR reflex   Collection Time: 10/20/23 11:34 PM   Specimen: STOOL  Result Value Ref Range   C Diff antigen NEGATIVE NEGATIVE   C Diff  toxin NEGATIVE NEGATIVE   C Diff interpretation No C. difficile detected.   Lactic acid, plasma   Collection Time: 10/21/23 12:03 AM  Result Value Ref Range   Lactic Acid, Venous 3.7 (HH) 0.5 - 1.9 mmol/L  HIV Antibody (routine testing w rflx)   Collection Time: 10/21/23 12:03 AM  Result Value Ref Range   HIV Screen 4th Generation wRfx Non Reactive Non Reactive  Phosphorus   Collection Time: 10/21/23  3:56 AM  Result Value Ref Range   Phosphorus 2.7 2.5 - 4.6 mg/dL  Magnesium    Collection Time: 10/21/23  3:56 AM  Result Value Ref Range   Magnesium  2.1 1.7 - 2.4 mg/dL  Basic metabolic panel   Collection Time: 10/21/23  3:56 AM  Result Value Ref Range   Sodium 135 135 - 145 mmol/L   Potassium 3.7 3.5 - 5.1 mmol/L   Chloride 105 98 - 111 mmol/L   CO2 15 (L) 22 - 32 mmol/L   Glucose, Bld 188 (H) 70 - 99 mg/dL   BUN 14 6 - 20 mg/dL   Creatinine, Ser 8.89 0.61 - 1.24 mg/dL   Calcium  9.4 8.9 - 10.3 mg/dL   GFR, Estimated >  60 >60 mL/min   Anion gap 15 5 - 15  CBC   Collection Time: 10/21/23  3:56 AM  Result Value Ref Range   WBC 17.0 (H) 4.0 - 10.5 K/uL   RBC 5.12 4.22 - 5.81 MIL/uL   Hemoglobin 15.7 13.0 - 17.0 g/dL   HCT 54.3 60.9 - 47.9 %   MCV 89.1 80.0 - 100.0 fL   MCH 30.7 26.0 - 34.0 pg   MCHC 34.4 30.0 - 36.0 g/dL   RDW 87.2 88.4 - 84.4 %   Platelets 388 150 - 400 K/uL   nRBC 0.0 0.0 - 0.2 %  Urine Drug Screen, Qualitative ([facility] only)   Collection Time: 10/22/23  2:00 AM  Result Value Ref Range   Tricyclic, Ur Screen NONE DETECTED NONE DETECTED   Amphetamines, Ur Screen NONE DETECTED NONE DETECTED   MDMA (Ecstasy)Ur Screen NONE DETECTED NONE DETECTED   Cocaine Metabolite,Ur [place] NONE DETECTED NONE DETECTED   Opiate, Ur Screen NONE DETECTED NONE DETECTED   Phencyclidine (PCP) Ur S NONE DETECTED NONE DETECTED   Cannabinoid 50 Ng, Ur [place] NONE DETECTED NONE DETECTED   Barbiturates, Ur Screen POSITIVE (A) NONE DETECTED   Benzodiazepine, Ur Scrn POSITIVE (A) NONE  DETECTED   Methadone  Scn, Ur POSITIVE (A) NONE DETECTED  Renal function panel   Collection Time: 10/22/23  3:04 AM  Result Value Ref Range   Sodium 136 135 - 145 mmol/L   Potassium 3.6 3.5 - 5.1 mmol/L   Chloride 107 98 - 111 mmol/L   CO2 18 (L) 22 - 32 mmol/L   Glucose, Bld 127 (H) 70 - 99 mg/dL   BUN 21 (H) 6 - 20 mg/dL   Creatinine, Ser 9.01 0.61 - 1.24 mg/dL   Calcium  9.0 8.9 - 10.3 mg/dL   Phosphorus 3.3 2.5 - 4.6 mg/dL   Albumin 3.7 3.5 - 5.0 g/dL   GFR, Estimated >39 >39 mL/min   Anion gap 11 5 - 15  Magnesium    Collection Time: 10/22/23  3:04 AM  Result Value Ref Range   Magnesium  2.5 (H) 1.7 - 2.4 mg/dL  CBC   Collection Time: 10/22/23  3:04 AM  Result Value Ref Range   WBC 15.2 (H) 4.0 - 10.5 K/uL   RBC 4.45 4.22 - 5.81 MIL/uL   Hemoglobin 13.8 13.0 - 17.0 g/dL   HCT 60.0 60.9 - 47.9 %   MCV 89.7 80.0 - 100.0 fL   MCH 31.0 26.0 - 34.0 pg   MCHC 34.6 30.0 - 36.0 g/dL   RDW 87.2 88.4 - 84.4 %   Platelets 308 150 - 400 K/uL   nRBC 0.0 0.0 - 0.2 %  Hemoglobin A1c   Collection Time: 10/22/23  3:04 AM  Result Value Ref Range   Hgb A1c MFr Bld 5.2 4.8 - 5.6 %   Mean Plasma Glucose 102.54 mg/dL  Procalcitonin   Collection Time: 10/22/23  3:04 AM  Result Value Ref Range   Procalcitonin <0.10 ng/mL  Vitamin B12   Collection Time: 10/22/23  8:25 AM  Result Value Ref Range   Vitamin B-12 230 180 - 914 pg/mL  VITAMIN D  25 Hydroxy (Vit-D Deficiency, Fractures)   Collection Time: 10/22/23  8:25 AM  Result Value Ref Range   Vit D, 25-Hydroxy 32.89 30 - 100 ng/mL  Basic metabolic panel with GFR   Collection Time: 10/23/23  3:37 AM  Result Value Ref Range   Sodium 136 135 - 145 mmol/L   Potassium 3.7 3.5 -  5.1 mmol/L   Chloride 101 98 - 111 mmol/L   CO2 27 22 - 32 mmol/L   Glucose, Bld 88 70 - 99 mg/dL   BUN 10 6 - 20 mg/dL   Creatinine, Ser 9.07 0.61 - 1.24 mg/dL   Calcium  9.1 8.9 - 10.3 mg/dL   GFR, Estimated >39 >39 mL/min   Anion gap 8 5 - 15  CBC    Collection Time: 10/23/23  3:37 AM  Result Value Ref Range   WBC 9.4 4.0 - 10.5 K/uL   RBC 4.21 (L) 4.22 - 5.81 MIL/uL   Hemoglobin 13.2 13.0 - 17.0 g/dL   HCT 61.6 (L) 60.9 - 47.9 %   MCV 91.0 80.0 - 100.0 fL   MCH 31.4 26.0 - 34.0 pg   MCHC 34.5 30.0 - 36.0 g/dL   RDW 87.3 88.4 - 84.4 %   Platelets 270 150 - 400 K/uL   nRBC 0.0 0.0 - 0.2 %  Magnesium    Collection Time: 10/23/23  3:37 AM  Result Value Ref Range   Magnesium  2.6 (H) 1.7 - 2.4 mg/dL  Phosphorus   Collection Time: 10/23/23  3:37 AM  Result Value Ref Range   Phosphorus 3.5 2.5 - 4.6 mg/dL  Basic metabolic panel with GFR   Collection Time: 10/24/23  7:11 AM  Result Value Ref Range   Sodium 139 135 - 145 mmol/L   Potassium 3.9 3.5 - 5.1 mmol/L   Chloride 100 98 - 111 mmol/L   CO2 26 22 - 32 mmol/L   Glucose, Bld 109 (H) 70 - 99 mg/dL   BUN 9 6 - 20 mg/dL   Creatinine, Ser 9.23 0.61 - 1.24 mg/dL   Calcium  9.1 8.9 - 10.3 mg/dL   GFR, Estimated >39 >39 mL/min   Anion gap 13 5 - 15  CBC   Collection Time: 10/24/23  7:11 AM  Result Value Ref Range   WBC 5.6 4.0 - 10.5 K/uL   RBC 3.85 (L) 4.22 - 5.81 MIL/uL   Hemoglobin 11.9 (L) 13.0 - 17.0 g/dL   HCT 64.4 (L) 60.9 - 47.9 %   MCV 92.2 80.0 - 100.0 fL   MCH 30.9 26.0 - 34.0 pg   MCHC 33.5 30.0 - 36.0 g/dL   RDW 87.4 88.4 - 84.4 %   Platelets 235 150 - 400 K/uL   nRBC 0.0 0.0 - 0.2 %  Magnesium    Collection Time: 10/24/23  7:11 AM  Result Value Ref Range   Magnesium  2.3 1.7 - 2.4 mg/dL  Phosphorus   Collection Time: 10/24/23  7:11 AM  Result Value Ref Range   Phosphorus 4.5 2.5 - 4.6 mg/dL  Basic metabolic panel with GFR   Collection Time: 10/25/23  3:49 AM  Result Value Ref Range   Sodium 139 135 - 145 mmol/L   Potassium 4.6 3.5 - 5.1 mmol/L   Chloride 103 98 - 111 mmol/L   CO2 29 22 - 32 mmol/L   Glucose, Bld 106 (H) 70 - 99 mg/dL   BUN 13 6 - 20 mg/dL   Creatinine, Ser 9.17 0.61 - 1.24 mg/dL   Calcium  9.2 8.9 - 10.3 mg/dL   GFR, Estimated >39 >39  mL/min   Anion gap 7 5 - 15  CBC   Collection Time: 10/25/23  3:49 AM  Result Value Ref Range   WBC 5.7 4.0 - 10.5 K/uL   RBC 3.67 (L) 4.22 - 5.81 MIL/uL   Hemoglobin 11.2 (L) 13.0 - 17.0 g/dL  HCT 33.8 (L) 39.0 - 52.0 %   MCV 92.1 80.0 - 100.0 fL   MCH 30.5 26.0 - 34.0 pg   MCHC 33.1 30.0 - 36.0 g/dL   RDW 87.5 88.4 - 84.4 %   Platelets 247 150 - 400 K/uL   nRBC 0.0 0.0 - 0.2 %  Magnesium    Collection Time: 10/25/23  3:49 AM  Result Value Ref Range   Magnesium  2.4 1.7 - 2.4 mg/dL  Phosphorus   Collection Time: 10/25/23  3:49 AM  Result Value Ref Range   Phosphorus 5.0 (H) 2.5 - 4.6 mg/dL  Iron and TIBC   Collection Time: 10/25/23  3:49 AM  Result Value Ref Range   Iron 41 (L) 45 - 182 ug/dL   TIBC 660 749 - 549 ug/dL   Saturation Ratios 12 (L) 17.9 - 39.5 %   UIBC 298 ug/dL  Folate   Collection Time: 10/25/23  3:49 AM  Result Value Ref Range   Folate 10.8 >5.9 ng/mL      Assessment & Plan:   Problem List Items Addressed This Visit   None    Follow up plan: No follow-ups on file.

## 2023-11-01 NOTE — Telephone Encounter (Signed)
 No longer current dosing on this medication  Requested Prescriptions  Pending Prescriptions Disp Refills   lisinopril  (ZESTRIL ) 40 MG tablet [Pharmacy Med Name: LISINOPRIL  40 MG TABLET] 30 tablet 0    Sig: TAKE 1 TABLET BY MOUTH EVERY DAY     Cardiovascular:  ACE Inhibitors Failed - 11/01/2023  4:29 PM      Failed - Last BP in normal range    BP Readings from Last 1 Encounters:  10/25/23 (!) 129/100         Failed - Valid encounter within last 6 months    Recent Outpatient Visits   None            Passed - Cr in normal range and within 180 days    Creatinine  Date Value Ref Range Status  06/13/2014 0.73 mg/dL Final    Comment:    9.38-8.75 NOTE: New Reference Range  04/22/14    Creatinine, Ser  Date Value Ref Range Status  10/25/2023 0.82 0.61 - 1.24 mg/dL Final         Passed - K in normal range and within 180 days    Potassium  Date Value Ref Range Status  10/25/2023 4.6 3.5 - 5.1 mmol/L Final  06/13/2014 3.7 mmol/L Final    Comment:    3.5-5.1 NOTE: New Reference Range  04/22/14          Passed - Patient is not pregnant

## 2023-11-15 ENCOUNTER — Inpatient Hospital Stay: Payer: MEDICAID | Admitting: Nurse Practitioner

## 2023-11-15 NOTE — Progress Notes (Deleted)
 There were no vitals taken for this visit.   Subjective:    Patient ID: Martin Garcia, male    DOB: 1985-09-19, 38 y.o.   MRN: [id number]  HPI: Martin Garcia is a 38 y.o. male  No chief complaint on file.   Relevant past medical, surgical, family and social history reviewed and updated as indicated. Interim medical history since our last visit reviewed. Allergies and medications reviewed and updated.  Review of Systems  Per HPI unless specifically indicated above     Objective:    There were no vitals taken for this visit.  Wt Readings from Last 3 Encounters:  10/24/23 182 lb 12.2 oz (82.9 kg)  07/26/23 181 lb 14.1 oz (82.5 kg)  08/29/22 181 lb 12.8 oz (82.5 kg)    Physical Exam  Results for orders placed or performed during the hospital encounter of 10/20/23  CBC   Collection Time: 10/20/23  7:28 PM  Result Value Ref Range   WBC 3.7 (L) 4.0 - 10.5 K/uL   RBC 1.37 (L) 4.22 - 5.81 MIL/uL   Hemoglobin 4.4 (LL) 13.0 - 17.0 g/dL   HCT 86.7 (LL) 60.9 - 52.0 %   MCV 96.4 80.0 - 100.0 fL   MCH 32.1 26.0 - 34.0 pg   MCHC 33.3 30.0 - 36.0 g/dL   RDW 87.2 88.4 - 84.4 %   Platelets 121 (L) 150 - 400 K/uL   nRBC 0.0 0.0 - 0.2 %  Ethanol   Collection Time: 10/20/23  7:28 PM  Result Value Ref Range   Alcohol, Ethyl (B) 85 (H) <15 mg/dL  CBC   Collection Time: 10/20/23  8:07 PM  Result Value Ref Range   WBC 13.8 (H) 4.0 - 10.5 K/uL   RBC 4.70 4.22 - 5.81 MIL/uL   Hemoglobin 14.5 13.0 - 17.0 g/dL   HCT 57.7 60.9 - 47.9 %   MCV 89.8 80.0 - 100.0 fL   MCH 30.9 26.0 - 34.0 pg   MCHC 34.4 30.0 - 36.0 g/dL   RDW 87.3 88.4 - 84.4 %   Platelets 366 150 - 400 K/uL   nRBC 0.0 0.0 - 0.2 %  Comprehensive metabolic panel with GFR   Collection Time: 10/20/23  9:00 PM  Result Value Ref Range   Sodium 138 135 - 145 mmol/L   Potassium 3.2 (L) 3.5 - 5.1 mmol/L   Chloride 102 98 - 111 mmol/L   CO2 18 (L) 22 - 32 mmol/L   Glucose, Bld 159 (H) 70 - 99 mg/dL   BUN 11 6 - 20  mg/dL   Creatinine, Ser 9.02 0.61 - 1.24 mg/dL   Calcium  9.8 8.9 - 10.3 mg/dL   Total Protein 8.6 (H) 6.5 - 8.1 g/dL   Albumin 4.5 3.5 - 5.0 g/dL   AST 37 15 - 41 U/L   ALT 29 0 - 44 U/L   Alkaline Phosphatase 61 38 - 126 U/L   Total Bilirubin 0.6 0.0 - 1.2 mg/dL   GFR, Estimated >39 >39 mL/min   Anion gap 18 (H) 5 - 15  Procalcitonin   Collection Time: 10/20/23 10:01 PM  Result Value Ref Range   Procalcitonin <0.10 ng/mL  Lactic acid, plasma   Collection Time: 10/20/23 10:01 PM  Result Value Ref Range   Lactic Acid, Venous 3.8 (HH) 0.5 - 1.9 mmol/L  Lipase, blood   Collection Time: 10/20/23 10:01 PM  Result Value Ref Range   Lipase 104 (H) 11 - 51 U/L  Glucose, capillary   Collection Time: 10/20/23 11:16 PM  Result Value Ref Range   Glucose-Capillary 164 (H) 70 - 99 mg/dL  MRSA Next Gen by PCR, Nasal   Collection Time: 10/20/23 11:33 PM   Specimen: Nasal Mucosa; Nasal Swab  Result Value Ref Range   MRSA by PCR Next Gen NOT DETECTED NOT DETECTED  Gastrointestinal Panel by PCR , Stool   Collection Time: 10/20/23 11:34 PM   Specimen: Stool  Result Value Ref Range   Campylobacter species NOT DETECTED NOT DETECTED   Plesimonas shigelloides NOT DETECTED NOT DETECTED   Salmonella species NOT DETECTED NOT DETECTED   Yersinia enterocolitica NOT DETECTED NOT DETECTED   Vibrio species NOT DETECTED NOT DETECTED   Vibrio cholerae NOT DETECTED NOT DETECTED   Enteroaggregative E coli (EAEC) NOT DETECTED NOT DETECTED   Enteropathogenic E coli (EPEC) NOT DETECTED NOT DETECTED   Enterotoxigenic E coli (ETEC) NOT DETECTED NOT DETECTED   Shiga like toxin producing E coli (STEC) NOT DETECTED NOT DETECTED   Shigella/Enteroinvasive E coli (EIEC) NOT DETECTED NOT DETECTED   Cryptosporidium NOT DETECTED NOT DETECTED   Cyclospora cayetanensis NOT DETECTED NOT DETECTED   Entamoeba histolytica NOT DETECTED NOT DETECTED   Giardia lamblia NOT DETECTED NOT DETECTED   Adenovirus F40/41 NOT  DETECTED NOT DETECTED   Astrovirus NOT DETECTED NOT DETECTED   Norovirus GI/GII NOT DETECTED NOT DETECTED   Rotavirus A NOT DETECTED NOT DETECTED   Sapovirus (I, II, IV, and V) NOT DETECTED NOT DETECTED  C Difficile Quick Screen w PCR reflex   Collection Time: 10/20/23 11:34 PM   Specimen: STOOL  Result Value Ref Range   C Diff antigen NEGATIVE NEGATIVE   C Diff toxin NEGATIVE NEGATIVE   C Diff interpretation No C. difficile detected.   Lactic acid, plasma   Collection Time: 10/21/23 12:03 AM  Result Value Ref Range   Lactic Acid, Venous 3.7 (HH) 0.5 - 1.9 mmol/L  HIV Antibody (routine testing w rflx)   Collection Time: 10/21/23 12:03 AM  Result Value Ref Range   HIV Screen 4th Generation wRfx Non Reactive Non Reactive  Phosphorus   Collection Time: 10/21/23  3:56 AM  Result Value Ref Range   Phosphorus 2.7 2.5 - 4.6 mg/dL  Magnesium    Collection Time: 10/21/23  3:56 AM  Result Value Ref Range   Magnesium  2.1 1.7 - 2.4 mg/dL  Basic metabolic panel   Collection Time: 10/21/23  3:56 AM  Result Value Ref Range   Sodium 135 135 - 145 mmol/L   Potassium 3.7 3.5 - 5.1 mmol/L   Chloride 105 98 - 111 mmol/L   CO2 15 (L) 22 - 32 mmol/L   Glucose, Bld 188 (H) 70 - 99 mg/dL   BUN 14 6 - 20 mg/dL   Creatinine, Ser 8.89 0.61 - 1.24 mg/dL   Calcium  9.4 8.9 - 10.3 mg/dL   GFR, Estimated >39 >39 mL/min   Anion gap 15 5 - 15  CBC   Collection Time: 10/21/23  3:56 AM  Result Value Ref Range   WBC 17.0 (H) 4.0 - 10.5 K/uL   RBC 5.12 4.22 - 5.81 MIL/uL   Hemoglobin 15.7 13.0 - 17.0 g/dL   HCT 54.3 60.9 - 47.9 %   MCV 89.1 80.0 - 100.0 fL   MCH 30.7 26.0 - 34.0 pg   MCHC 34.4 30.0 - 36.0 g/dL   RDW 87.2 88.4 - 84.4 %   Platelets 388 150 - 400 K/uL  nRBC 0.0 0.0 - 0.2 %  Urine Drug Screen, Qualitative ([facility] only)   Collection Time: 10/22/23  2:00 AM  Result Value Ref Range   Tricyclic, Ur Screen NONE DETECTED NONE DETECTED   Amphetamines, Ur Screen NONE DETECTED NONE DETECTED    MDMA (Ecstasy)Ur Screen NONE DETECTED NONE DETECTED   Cocaine Metabolite,Ur [place] NONE DETECTED NONE DETECTED   Opiate, Ur Screen NONE DETECTED NONE DETECTED   Phencyclidine (PCP) Ur S NONE DETECTED NONE DETECTED   Cannabinoid 50 Ng, Ur [place] NONE DETECTED NONE DETECTED   Barbiturates, Ur Screen POSITIVE (A) NONE DETECTED   Benzodiazepine, Ur Scrn POSITIVE (A) NONE DETECTED   Methadone  Scn, Ur POSITIVE (A) NONE DETECTED  Renal function panel   Collection Time: 10/22/23  3:04 AM  Result Value Ref Range   Sodium 136 135 - 145 mmol/L   Potassium 3.6 3.5 - 5.1 mmol/L   Chloride 107 98 - 111 mmol/L   CO2 18 (L) 22 - 32 mmol/L   Glucose, Bld 127 (H) 70 - 99 mg/dL   BUN 21 (H) 6 - 20 mg/dL   Creatinine, Ser 9.01 0.61 - 1.24 mg/dL   Calcium  9.0 8.9 - 10.3 mg/dL   Phosphorus 3.3 2.5 - 4.6 mg/dL   Albumin 3.7 3.5 - 5.0 g/dL   GFR, Estimated >39 >39 mL/min   Anion gap 11 5 - 15  Magnesium    Collection Time: 10/22/23  3:04 AM  Result Value Ref Range   Magnesium  2.5 (H) 1.7 - 2.4 mg/dL  CBC   Collection Time: 10/22/23  3:04 AM  Result Value Ref Range   WBC 15.2 (H) 4.0 - 10.5 K/uL   RBC 4.45 4.22 - 5.81 MIL/uL   Hemoglobin 13.8 13.0 - 17.0 g/dL   HCT 60.0 60.9 - 47.9 %   MCV 89.7 80.0 - 100.0 fL   MCH 31.0 26.0 - 34.0 pg   MCHC 34.6 30.0 - 36.0 g/dL   RDW 87.2 88.4 - 84.4 %   Platelets 308 150 - 400 K/uL   nRBC 0.0 0.0 - 0.2 %  Hemoglobin A1c   Collection Time: 10/22/23  3:04 AM  Result Value Ref Range   Hgb A1c MFr Bld 5.2 4.8 - 5.6 %   Mean Plasma Glucose 102.54 mg/dL  Procalcitonin   Collection Time: 10/22/23  3:04 AM  Result Value Ref Range   Procalcitonin <0.10 ng/mL  Vitamin B12   Collection Time: 10/22/23  8:25 AM  Result Value Ref Range   Vitamin B-12 230 180 - 914 pg/mL  VITAMIN D  25 Hydroxy (Vit-D Deficiency, Fractures)   Collection Time: 10/22/23  8:25 AM  Result Value Ref Range   Vit D, 25-Hydroxy 32.89 30 - 100 ng/mL  Basic metabolic panel with GFR   Collection  Time: 10/23/23  3:37 AM  Result Value Ref Range   Sodium 136 135 - 145 mmol/L   Potassium 3.7 3.5 - 5.1 mmol/L   Chloride 101 98 - 111 mmol/L   CO2 27 22 - 32 mmol/L   Glucose, Bld 88 70 - 99 mg/dL   BUN 10 6 - 20 mg/dL   Creatinine, Ser 9.07 0.61 - 1.24 mg/dL   Calcium  9.1 8.9 - 10.3 mg/dL   GFR, Estimated >39 >39 mL/min   Anion gap 8 5 - 15  CBC   Collection Time: 10/23/23  3:37 AM  Result Value Ref Range   WBC 9.4 4.0 - 10.5 K/uL   RBC 4.21 (L) 4.22 - 5.81 MIL/uL  Hemoglobin 13.2 13.0 - 17.0 g/dL   HCT 61.6 (L) 60.9 - 47.9 %   MCV 91.0 80.0 - 100.0 fL   MCH 31.4 26.0 - 34.0 pg   MCHC 34.5 30.0 - 36.0 g/dL   RDW 87.3 88.4 - 84.4 %   Platelets 270 150 - 400 K/uL   nRBC 0.0 0.0 - 0.2 %  Magnesium    Collection Time: 10/23/23  3:37 AM  Result Value Ref Range   Magnesium  2.6 (H) 1.7 - 2.4 mg/dL  Phosphorus   Collection Time: 10/23/23  3:37 AM  Result Value Ref Range   Phosphorus 3.5 2.5 - 4.6 mg/dL  Basic metabolic panel with GFR   Collection Time: 10/24/23  7:11 AM  Result Value Ref Range   Sodium 139 135 - 145 mmol/L   Potassium 3.9 3.5 - 5.1 mmol/L   Chloride 100 98 - 111 mmol/L   CO2 26 22 - 32 mmol/L   Glucose, Bld 109 (H) 70 - 99 mg/dL   BUN 9 6 - 20 mg/dL   Creatinine, Ser 9.23 0.61 - 1.24 mg/dL   Calcium  9.1 8.9 - 10.3 mg/dL   GFR, Estimated >39 >39 mL/min   Anion gap 13 5 - 15  CBC   Collection Time: 10/24/23  7:11 AM  Result Value Ref Range   WBC 5.6 4.0 - 10.5 K/uL   RBC 3.85 (L) 4.22 - 5.81 MIL/uL   Hemoglobin 11.9 (L) 13.0 - 17.0 g/dL   HCT 64.4 (L) 60.9 - 47.9 %   MCV 92.2 80.0 - 100.0 fL   MCH 30.9 26.0 - 34.0 pg   MCHC 33.5 30.0 - 36.0 g/dL   RDW 87.4 88.4 - 84.4 %   Platelets 235 150 - 400 K/uL   nRBC 0.0 0.0 - 0.2 %  Magnesium    Collection Time: 10/24/23  7:11 AM  Result Value Ref Range   Magnesium  2.3 1.7 - 2.4 mg/dL  Phosphorus   Collection Time: 10/24/23  7:11 AM  Result Value Ref Range   Phosphorus 4.5 2.5 - 4.6 mg/dL  Basic  metabolic panel with GFR   Collection Time: 10/25/23  3:49 AM  Result Value Ref Range   Sodium 139 135 - 145 mmol/L   Potassium 4.6 3.5 - 5.1 mmol/L   Chloride 103 98 - 111 mmol/L   CO2 29 22 - 32 mmol/L   Glucose, Bld 106 (H) 70 - 99 mg/dL   BUN 13 6 - 20 mg/dL   Creatinine, Ser 9.17 0.61 - 1.24 mg/dL   Calcium  9.2 8.9 - 10.3 mg/dL   GFR, Estimated >39 >39 mL/min   Anion gap 7 5 - 15  CBC   Collection Time: 10/25/23  3:49 AM  Result Value Ref Range   WBC 5.7 4.0 - 10.5 K/uL   RBC 3.67 (L) 4.22 - 5.81 MIL/uL   Hemoglobin 11.2 (L) 13.0 - 17.0 g/dL   HCT 66.1 (L) 60.9 - 47.9 %   MCV 92.1 80.0 - 100.0 fL   MCH 30.5 26.0 - 34.0 pg   MCHC 33.1 30.0 - 36.0 g/dL   RDW 87.5 88.4 - 84.4 %   Platelets 247 150 - 400 K/uL   nRBC 0.0 0.0 - 0.2 %  Magnesium    Collection Time: 10/25/23  3:49 AM  Result Value Ref Range   Magnesium  2.4 1.7 - 2.4 mg/dL  Phosphorus   Collection Time: 10/25/23  3:49 AM  Result Value Ref Range   Phosphorus 5.0 (H) 2.5 -  4.6 mg/dL  Iron and TIBC   Collection Time: 10/25/23  3:49 AM  Result Value Ref Range   Iron 41 (L) 45 - 182 ug/dL   TIBC 660 749 - 549 ug/dL   Saturation Ratios 12 (L) 17.9 - 39.5 %   UIBC 298 ug/dL  Folate   Collection Time: 10/25/23  3:49 AM  Result Value Ref Range   Folate 10.8 >5.9 ng/mL      Assessment & Plan:   Problem List Items Addressed This Visit   None    Follow up plan: No follow-ups on file.

## 2023-12-28 ENCOUNTER — Ambulatory Visit: Payer: Self-pay | Admitting: Nurse Practitioner

## 2023-12-28 ENCOUNTER — Encounter: Payer: Self-pay | Admitting: Nurse Practitioner

## 2023-12-28 ENCOUNTER — Inpatient Hospital Stay (INDEPENDENT_AMBULATORY_CARE_PROVIDER_SITE_OTHER): Payer: MEDICAID | Admitting: Nurse Practitioner

## 2023-12-28 VITALS — BP 109/71 | HR 98 | Temp 99.5°F | Ht 71.0 in | Wt 182.8 lb

## 2023-12-28 DIAGNOSIS — I10 Essential (primary) hypertension: Secondary | ICD-10-CM

## 2023-12-28 DIAGNOSIS — F419 Anxiety disorder, unspecified: Secondary | ICD-10-CM

## 2023-12-28 DIAGNOSIS — F1190 Opioid use, unspecified, uncomplicated: Secondary | ICD-10-CM

## 2023-12-28 DIAGNOSIS — F10132 Alcohol abuse with withdrawal with perceptual disturbance: Secondary | ICD-10-CM

## 2023-12-28 MED ORDER — HYDROXYZINE HCL 50 MG PO TABS: 50.0000 mg | ORAL_TABLET | Freq: Four times a day (QID) | ORAL | 0 refills | Status: DC | PRN

## 2023-12-28 MED ORDER — TRAZODONE HCL 100 MG PO TABS: 50.0000 mg | ORAL_TABLET | Freq: Every day | ORAL | 2 refills | Status: DC

## 2023-12-28 MED ORDER — BUSPIRONE HCL 10 MG PO TABS: 20.0000 mg | ORAL_TABLET | Freq: Three times a day (TID) | ORAL | 0 refills | Status: DC

## 2023-12-28 MED ORDER — SERTRALINE HCL 50 MG PO TABS: 50.0000 mg | ORAL_TABLET | Freq: Every day | ORAL | 2 refills | Status: DC

## 2023-12-28 NOTE — Progress Notes (Deleted)
 There were no vitals taken for this visit.   Subjective:    Patient ID: Martin Garcia, male    DOB: 1985-09-19, 38 y.o.   MRN: [id number]  HPI: Martin Garcia is a 38 y.o. male  No chief complaint on file.   Relevant past medical, surgical, family and social history reviewed and updated as indicated. Interim medical history since our last visit reviewed. Allergies and medications reviewed and updated.  Review of Systems  Per HPI unless specifically indicated above     Objective:    There were no vitals taken for this visit.  Wt Readings from Last 3 Encounters:  10/24/23 182 lb 12.2 oz (82.9 kg)  07/26/23 181 lb 14.1 oz (82.5 kg)  08/29/22 181 lb 12.8 oz (82.5 kg)    Physical Exam  Results for orders placed or performed during the hospital encounter of 10/20/23  CBC   Collection Time: 10/20/23  7:28 PM  Result Value Ref Range   WBC 3.7 (L) 4.0 - 10.5 K/uL   RBC 1.37 (L) 4.22 - 5.81 MIL/uL   Hemoglobin 4.4 (LL) 13.0 - 17.0 g/dL   HCT 86.7 (LL) 60.9 - 52.0 %   MCV 96.4 80.0 - 100.0 fL   MCH 32.1 26.0 - 34.0 pg   MCHC 33.3 30.0 - 36.0 g/dL   RDW 87.2 88.4 - 84.4 %   Platelets 121 (L) 150 - 400 K/uL   nRBC 0.0 0.0 - 0.2 %  Ethanol   Collection Time: 10/20/23  7:28 PM  Result Value Ref Range   Alcohol, Ethyl (B) 85 (H) <15 mg/dL  CBC   Collection Time: 10/20/23  8:07 PM  Result Value Ref Range   WBC 13.8 (H) 4.0 - 10.5 K/uL   RBC 4.70 4.22 - 5.81 MIL/uL   Hemoglobin 14.5 13.0 - 17.0 g/dL   HCT 57.7 60.9 - 47.9 %   MCV 89.8 80.0 - 100.0 fL   MCH 30.9 26.0 - 34.0 pg   MCHC 34.4 30.0 - 36.0 g/dL   RDW 87.3 88.4 - 84.4 %   Platelets 366 150 - 400 K/uL   nRBC 0.0 0.0 - 0.2 %  Comprehensive metabolic panel with GFR   Collection Time: 10/20/23  9:00 PM  Result Value Ref Range   Sodium 138 135 - 145 mmol/L   Potassium 3.2 (L) 3.5 - 5.1 mmol/L   Chloride 102 98 - 111 mmol/L   CO2 18 (L) 22 - 32 mmol/L   Glucose, Bld 159 (H) 70 - 99 mg/dL   BUN 11 6 - 20  mg/dL   Creatinine, Ser 9.02 0.61 - 1.24 mg/dL   Calcium  9.8 8.9 - 10.3 mg/dL   Total Protein 8.6 (H) 6.5 - 8.1 g/dL   Albumin 4.5 3.5 - 5.0 g/dL   AST 37 15 - 41 U/L   ALT 29 0 - 44 U/L   Alkaline Phosphatase 61 38 - 126 U/L   Total Bilirubin 0.6 0.0 - 1.2 mg/dL   GFR, Estimated >39 >39 mL/min   Anion gap 18 (H) 5 - 15  Procalcitonin   Collection Time: 10/20/23 10:01 PM  Result Value Ref Range   Procalcitonin <0.10 ng/mL  Lactic acid, plasma   Collection Time: 10/20/23 10:01 PM  Result Value Ref Range   Lactic Acid, Venous 3.8 (HH) 0.5 - 1.9 mmol/L  Lipase, blood   Collection Time: 10/20/23 10:01 PM  Result Value Ref Range   Lipase 104 (H) 11 - 51 U/L  Glucose, capillary   Collection Time: 10/20/23 11:16 PM  Result Value Ref Range   Glucose-Capillary 164 (H) 70 - 99 mg/dL  MRSA Next Gen by PCR, Nasal   Collection Time: 10/20/23 11:33 PM   Specimen: Nasal Mucosa; Nasal Swab  Result Value Ref Range   MRSA by PCR Next Gen NOT DETECTED NOT DETECTED  Gastrointestinal Panel by PCR , Stool   Collection Time: 10/20/23 11:34 PM   Specimen: Stool  Result Value Ref Range   Campylobacter species NOT DETECTED NOT DETECTED   Plesimonas shigelloides NOT DETECTED NOT DETECTED   Salmonella species NOT DETECTED NOT DETECTED   Yersinia enterocolitica NOT DETECTED NOT DETECTED   Vibrio species NOT DETECTED NOT DETECTED   Vibrio cholerae NOT DETECTED NOT DETECTED   Enteroaggregative E coli (EAEC) NOT DETECTED NOT DETECTED   Enteropathogenic E coli (EPEC) NOT DETECTED NOT DETECTED   Enterotoxigenic E coli (ETEC) NOT DETECTED NOT DETECTED   Shiga like toxin producing E coli (STEC) NOT DETECTED NOT DETECTED   Shigella/Enteroinvasive E coli (EIEC) NOT DETECTED NOT DETECTED   Cryptosporidium NOT DETECTED NOT DETECTED   Cyclospora cayetanensis NOT DETECTED NOT DETECTED   Entamoeba histolytica NOT DETECTED NOT DETECTED   Giardia lamblia NOT DETECTED NOT DETECTED   Adenovirus F40/41 NOT  DETECTED NOT DETECTED   Astrovirus NOT DETECTED NOT DETECTED   Norovirus GI/GII NOT DETECTED NOT DETECTED   Rotavirus A NOT DETECTED NOT DETECTED   Sapovirus (I, II, IV, and V) NOT DETECTED NOT DETECTED  C Difficile Quick Screen w PCR reflex   Collection Time: 10/20/23 11:34 PM   Specimen: STOOL  Result Value Ref Range   C Diff antigen NEGATIVE NEGATIVE   C Diff toxin NEGATIVE NEGATIVE   C Diff interpretation No C. difficile detected.   Lactic acid, plasma   Collection Time: 10/21/23 12:03 AM  Result Value Ref Range   Lactic Acid, Venous 3.7 (HH) 0.5 - 1.9 mmol/L  HIV Antibody (routine testing w rflx)   Collection Time: 10/21/23 12:03 AM  Result Value Ref Range   HIV Screen 4th Generation wRfx Non Reactive Non Reactive  Phosphorus   Collection Time: 10/21/23  3:56 AM  Result Value Ref Range   Phosphorus 2.7 2.5 - 4.6 mg/dL  Magnesium    Collection Time: 10/21/23  3:56 AM  Result Value Ref Range   Magnesium  2.1 1.7 - 2.4 mg/dL  Basic metabolic panel   Collection Time: 10/21/23  3:56 AM  Result Value Ref Range   Sodium 135 135 - 145 mmol/L   Potassium 3.7 3.5 - 5.1 mmol/L   Chloride 105 98 - 111 mmol/L   CO2 15 (L) 22 - 32 mmol/L   Glucose, Bld 188 (H) 70 - 99 mg/dL   BUN 14 6 - 20 mg/dL   Creatinine, Ser 8.89 0.61 - 1.24 mg/dL   Calcium  9.4 8.9 - 10.3 mg/dL   GFR, Estimated >39 >39 mL/min   Anion gap 15 5 - 15  CBC   Collection Time: 10/21/23  3:56 AM  Result Value Ref Range   WBC 17.0 (H) 4.0 - 10.5 K/uL   RBC 5.12 4.22 - 5.81 MIL/uL   Hemoglobin 15.7 13.0 - 17.0 g/dL   HCT 54.3 60.9 - 47.9 %   MCV 89.1 80.0 - 100.0 fL   MCH 30.7 26.0 - 34.0 pg   MCHC 34.4 30.0 - 36.0 g/dL   RDW 87.2 88.4 - 84.4 %   Platelets 388 150 - 400 K/uL  nRBC 0.0 0.0 - 0.2 %  Urine Drug Screen, Qualitative ([facility] only)   Collection Time: 10/22/23  2:00 AM  Result Value Ref Range   Tricyclic, Ur Screen NONE DETECTED NONE DETECTED   Amphetamines, Ur Screen NONE DETECTED NONE DETECTED    MDMA (Ecstasy)Ur Screen NONE DETECTED NONE DETECTED   Cocaine Metabolite,Ur [place] NONE DETECTED NONE DETECTED   Opiate, Ur Screen NONE DETECTED NONE DETECTED   Phencyclidine (PCP) Ur S NONE DETECTED NONE DETECTED   Cannabinoid 50 Ng, Ur [place] NONE DETECTED NONE DETECTED   Barbiturates, Ur Screen POSITIVE (A) NONE DETECTED   Benzodiazepine, Ur Scrn POSITIVE (A) NONE DETECTED   Methadone  Scn, Ur POSITIVE (A) NONE DETECTED  Renal function panel   Collection Time: 10/22/23  3:04 AM  Result Value Ref Range   Sodium 136 135 - 145 mmol/L   Potassium 3.6 3.5 - 5.1 mmol/L   Chloride 107 98 - 111 mmol/L   CO2 18 (L) 22 - 32 mmol/L   Glucose, Bld 127 (H) 70 - 99 mg/dL   BUN 21 (H) 6 - 20 mg/dL   Creatinine, Ser 9.01 0.61 - 1.24 mg/dL   Calcium  9.0 8.9 - 10.3 mg/dL   Phosphorus 3.3 2.5 - 4.6 mg/dL   Albumin 3.7 3.5 - 5.0 g/dL   GFR, Estimated >39 >39 mL/min   Anion gap 11 5 - 15  Magnesium    Collection Time: 10/22/23  3:04 AM  Result Value Ref Range   Magnesium  2.5 (H) 1.7 - 2.4 mg/dL  CBC   Collection Time: 10/22/23  3:04 AM  Result Value Ref Range   WBC 15.2 (H) 4.0 - 10.5 K/uL   RBC 4.45 4.22 - 5.81 MIL/uL   Hemoglobin 13.8 13.0 - 17.0 g/dL   HCT 60.0 60.9 - 47.9 %   MCV 89.7 80.0 - 100.0 fL   MCH 31.0 26.0 - 34.0 pg   MCHC 34.6 30.0 - 36.0 g/dL   RDW 87.2 88.4 - 84.4 %   Platelets 308 150 - 400 K/uL   nRBC 0.0 0.0 - 0.2 %  Hemoglobin A1c   Collection Time: 10/22/23  3:04 AM  Result Value Ref Range   Hgb A1c MFr Bld 5.2 4.8 - 5.6 %   Mean Plasma Glucose 102.54 mg/dL  Procalcitonin   Collection Time: 10/22/23  3:04 AM  Result Value Ref Range   Procalcitonin <0.10 ng/mL  Vitamin B12   Collection Time: 10/22/23  8:25 AM  Result Value Ref Range   Vitamin B-12 230 180 - 914 pg/mL  VITAMIN D  25 Hydroxy (Vit-D Deficiency, Fractures)   Collection Time: 10/22/23  8:25 AM  Result Value Ref Range   Vit D, 25-Hydroxy 32.89 30 - 100 ng/mL  Basic metabolic panel with GFR   Collection  Time: 10/23/23  3:37 AM  Result Value Ref Range   Sodium 136 135 - 145 mmol/L   Potassium 3.7 3.5 - 5.1 mmol/L   Chloride 101 98 - 111 mmol/L   CO2 27 22 - 32 mmol/L   Glucose, Bld 88 70 - 99 mg/dL   BUN 10 6 - 20 mg/dL   Creatinine, Ser 9.07 0.61 - 1.24 mg/dL   Calcium  9.1 8.9 - 10.3 mg/dL   GFR, Estimated >39 >39 mL/min   Anion gap 8 5 - 15  CBC   Collection Time: 10/23/23  3:37 AM  Result Value Ref Range   WBC 9.4 4.0 - 10.5 K/uL   RBC 4.21 (L) 4.22 - 5.81 MIL/uL  Hemoglobin 13.2 13.0 - 17.0 g/dL   HCT 61.6 (L) 60.9 - 47.9 %   MCV 91.0 80.0 - 100.0 fL   MCH 31.4 26.0 - 34.0 pg   MCHC 34.5 30.0 - 36.0 g/dL   RDW 87.3 88.4 - 84.4 %   Platelets 270 150 - 400 K/uL   nRBC 0.0 0.0 - 0.2 %  Magnesium    Collection Time: 10/23/23  3:37 AM  Result Value Ref Range   Magnesium  2.6 (H) 1.7 - 2.4 mg/dL  Phosphorus   Collection Time: 10/23/23  3:37 AM  Result Value Ref Range   Phosphorus 3.5 2.5 - 4.6 mg/dL  Basic metabolic panel with GFR   Collection Time: 10/24/23  7:11 AM  Result Value Ref Range   Sodium 139 135 - 145 mmol/L   Potassium 3.9 3.5 - 5.1 mmol/L   Chloride 100 98 - 111 mmol/L   CO2 26 22 - 32 mmol/L   Glucose, Bld 109 (H) 70 - 99 mg/dL   BUN 9 6 - 20 mg/dL   Creatinine, Ser 9.23 0.61 - 1.24 mg/dL   Calcium  9.1 8.9 - 10.3 mg/dL   GFR, Estimated >39 >39 mL/min   Anion gap 13 5 - 15  CBC   Collection Time: 10/24/23  7:11 AM  Result Value Ref Range   WBC 5.6 4.0 - 10.5 K/uL   RBC 3.85 (L) 4.22 - 5.81 MIL/uL   Hemoglobin 11.9 (L) 13.0 - 17.0 g/dL   HCT 64.4 (L) 60.9 - 47.9 %   MCV 92.2 80.0 - 100.0 fL   MCH 30.9 26.0 - 34.0 pg   MCHC 33.5 30.0 - 36.0 g/dL   RDW 87.4 88.4 - 84.4 %   Platelets 235 150 - 400 K/uL   nRBC 0.0 0.0 - 0.2 %  Magnesium    Collection Time: 10/24/23  7:11 AM  Result Value Ref Range   Magnesium  2.3 1.7 - 2.4 mg/dL  Phosphorus   Collection Time: 10/24/23  7:11 AM  Result Value Ref Range   Phosphorus 4.5 2.5 - 4.6 mg/dL  Basic  metabolic panel with GFR   Collection Time: 10/25/23  3:49 AM  Result Value Ref Range   Sodium 139 135 - 145 mmol/L   Potassium 4.6 3.5 - 5.1 mmol/L   Chloride 103 98 - 111 mmol/L   CO2 29 22 - 32 mmol/L   Glucose, Bld 106 (H) 70 - 99 mg/dL   BUN 13 6 - 20 mg/dL   Creatinine, Ser 9.17 0.61 - 1.24 mg/dL   Calcium  9.2 8.9 - 10.3 mg/dL   GFR, Estimated >39 >39 mL/min   Anion gap 7 5 - 15  CBC   Collection Time: 10/25/23  3:49 AM  Result Value Ref Range   WBC 5.7 4.0 - 10.5 K/uL   RBC 3.67 (L) 4.22 - 5.81 MIL/uL   Hemoglobin 11.2 (L) 13.0 - 17.0 g/dL   HCT 66.1 (L) 60.9 - 47.9 %   MCV 92.1 80.0 - 100.0 fL   MCH 30.5 26.0 - 34.0 pg   MCHC 33.1 30.0 - 36.0 g/dL   RDW 87.5 88.4 - 84.4 %   Platelets 247 150 - 400 K/uL   nRBC 0.0 0.0 - 0.2 %  Magnesium    Collection Time: 10/25/23  3:49 AM  Result Value Ref Range   Magnesium  2.4 1.7 - 2.4 mg/dL  Phosphorus   Collection Time: 10/25/23  3:49 AM  Result Value Ref Range   Phosphorus 5.0 (H) 2.5 -  4.6 mg/dL  Iron and TIBC   Collection Time: 10/25/23  3:49 AM  Result Value Ref Range   Iron 41 (L) 45 - 182 ug/dL   TIBC 660 749 - 549 ug/dL   Saturation Ratios 12 (L) 17.9 - 39.5 %   UIBC 298 ug/dL  Folate   Collection Time: 10/25/23  3:49 AM  Result Value Ref Range   Folate 10.8 >5.9 ng/mL      Assessment & Plan:   Problem List Items Addressed This Visit   None    Follow up plan: No follow-ups on file.

## 2023-12-28 NOTE — Progress Notes (Signed)
 BP 109/71   Pulse 98   Temp 99.5 F (37.5 C) (Oral)   Ht 5' 11 (1.803 m)   Wt 182 lb 12.8 oz (82.9 kg)   SpO2 98%   BMI 25.50 kg/m    Subjective:    Patient ID: Martin Garcia, male    DOB: 1985/12/23, 38 y.o.   MRN: [id number]  HPI: Martin Garcia is a 38 y.o. male  Chief Complaint  Patient presents with   Hypertension   Alcohol Problem   Patient states he just got out of rehab on 12/17/23. He has 56 days sober.  He is doing really well.  He does need refills today.  He is taking Zoloft  25 and feels like he would like to increase the dose.  He is sleeping okay but doesn't feel like the trazodone 50mg  is helping anymore.  He is taking hydroxyzine  and Buspar TID.  Feels like his anxiety is still really high and needs both medications. He is on methadone  and on monthly take homes.  HYPERTENSION without Chronic Kidney Disease Hypertension status: controlled  Satisfied with current treatment? no Duration of hypertension: years BP monitoring frequency:  not checking BP range:  BP medication side effects:  no Medication compliance: excellent compliance Previous BP meds:lisinopril  Aspirin : no Recurrent headaches: no Visual changes: no Palpitations: no Dyspnea: no Chest pain: no Lower extremity edema: no Dizzy/lightheaded: no    Relevant past medical, surgical, family and social history reviewed and updated as indicated. Interim medical history since our last visit reviewed. Allergies and medications reviewed and updated.  Review of Systems  Eyes:  Negative for visual disturbance.  Respiratory:  Negative for shortness of breath.   Cardiovascular:  Negative for chest pain and leg swelling.  Neurological:  Negative for light-headedness and headaches.  Psychiatric/Behavioral:  Positive for dysphoric mood and sleep disturbance. Negative for suicidal ideas. The patient is nervous/anxious.     Per HPI unless specifically indicated above     Objective:    BP  109/71   Pulse 98   Temp 99.5 F (37.5 C) (Oral)   Ht 5' 11 (1.803 m)   Wt 182 lb 12.8 oz (82.9 kg)   SpO2 98%   BMI 25.50 kg/m   Wt Readings from Last 3 Encounters:  12/28/23 182 lb 12.8 oz (82.9 kg)  10/24/23 182 lb 12.2 oz (82.9 kg)  07/26/23 181 lb 14.1 oz (82.5 kg)    Physical Exam Vitals and nursing note reviewed.  Constitutional:      General: He is not in acute distress.    Appearance: Normal appearance. He is not ill-appearing, toxic-appearing or diaphoretic.  HENT:     Head: Normocephalic.     Right Ear: External ear normal.     Left Ear: External ear normal.     Nose: Nose normal. No congestion or rhinorrhea.     Mouth/Throat:     Mouth: Mucous membranes are moist.  Eyes:     General:        Right eye: No discharge.        Left eye: No discharge.     Extraocular Movements: Extraocular movements intact.     Conjunctiva/sclera: Conjunctivae normal.     Pupils: Pupils are equal, round, and reactive to light.  Cardiovascular:     Rate and Rhythm: Normal rate and regular rhythm.     Heart sounds: No murmur heard. Pulmonary:     Effort: Pulmonary effort is normal. No respiratory distress.  Breath sounds: Normal breath sounds. No wheezing, rhonchi or rales.  Abdominal:     General: Abdomen is flat. Bowel sounds are normal.  Musculoskeletal:     Cervical back: Normal range of motion and neck supple.  Skin:    General: Skin is warm and dry.     Capillary Refill: Capillary refill takes less than 2 seconds.  Neurological:     General: No focal deficit present.     Mental Status: He is alert and oriented to person, place, and time.  Psychiatric:        Mood and Affect: Mood normal.        Behavior: Behavior normal.        Thought Content: Thought content normal.        Judgment: Judgment normal.     Results for orders placed or performed during the hospital encounter of 10/20/23  CBC   Collection Time: 10/20/23  7:28 PM  Result Value Ref Range   WBC 3.7  (L) 4.0 - 10.5 K/uL   RBC 1.37 (L) 4.22 - 5.81 MIL/uL   Hemoglobin 4.4 (LL) 13.0 - 17.0 g/dL   HCT 86.7 (LL) 60.9 - 52.0 %   MCV 96.4 80.0 - 100.0 fL   MCH 32.1 26.0 - 34.0 pg   MCHC 33.3 30.0 - 36.0 g/dL   RDW 87.2 88.4 - 84.4 %   Platelets 121 (L) 150 - 400 K/uL   nRBC 0.0 0.0 - 0.2 %  Ethanol   Collection Time: 10/20/23  7:28 PM  Result Value Ref Range   Alcohol, Ethyl (B) 85 (H) <15 mg/dL  CBC   Collection Time: 10/20/23  8:07 PM  Result Value Ref Range   WBC 13.8 (H) 4.0 - 10.5 K/uL   RBC 4.70 4.22 - 5.81 MIL/uL   Hemoglobin 14.5 13.0 - 17.0 g/dL   HCT 57.7 60.9 - 47.9 %   MCV 89.8 80.0 - 100.0 fL   MCH 30.9 26.0 - 34.0 pg   MCHC 34.4 30.0 - 36.0 g/dL   RDW 87.3 88.4 - 84.4 %   Platelets 366 150 - 400 K/uL   nRBC 0.0 0.0 - 0.2 %  Comprehensive metabolic panel with GFR   Collection Time: 10/20/23  9:00 PM  Result Value Ref Range   Sodium 138 135 - 145 mmol/L   Potassium 3.2 (L) 3.5 - 5.1 mmol/L   Chloride 102 98 - 111 mmol/L   CO2 18 (L) 22 - 32 mmol/L   Glucose, Bld 159 (H) 70 - 99 mg/dL   BUN 11 6 - 20 mg/dL   Creatinine, Ser 9.02 0.61 - 1.24 mg/dL   Calcium  9.8 8.9 - 10.3 mg/dL   Total Protein 8.6 (H) 6.5 - 8.1 g/dL   Albumin 4.5 3.5 - 5.0 g/dL   AST 37 15 - 41 U/L   ALT 29 0 - 44 U/L   Alkaline Phosphatase 61 38 - 126 U/L   Total Bilirubin 0.6 0.0 - 1.2 mg/dL   GFR, Estimated >39 >39 mL/min   Anion gap 18 (H) 5 - 15  Procalcitonin   Collection Time: 10/20/23 10:01 PM  Result Value Ref Range   Procalcitonin <0.10 ng/mL  Lactic acid, plasma   Collection Time: 10/20/23 10:01 PM  Result Value Ref Range   Lactic Acid, Venous 3.8 (HH) 0.5 - 1.9 mmol/L  Lipase, blood   Collection Time: 10/20/23 10:01 PM  Result Value Ref Range   Lipase 104 (H) 11 - 51 U/L  Glucose, capillary   Collection Time: 10/20/23 11:16 PM  Result Value Ref Range   Glucose-Capillary 164 (H) 70 - 99 mg/dL  MRSA Next Gen by PCR, Nasal   Collection Time: 10/20/23 11:33 PM   Specimen:  Nasal Mucosa; Nasal Swab  Result Value Ref Range   MRSA by PCR Next Gen NOT DETECTED NOT DETECTED  Gastrointestinal Panel by PCR , Stool   Collection Time: 10/20/23 11:34 PM   Specimen: Stool  Result Value Ref Range   Campylobacter species NOT DETECTED NOT DETECTED   Plesimonas shigelloides NOT DETECTED NOT DETECTED   Salmonella species NOT DETECTED NOT DETECTED   Yersinia enterocolitica NOT DETECTED NOT DETECTED   Vibrio species NOT DETECTED NOT DETECTED   Vibrio cholerae NOT DETECTED NOT DETECTED   Enteroaggregative E coli (EAEC) NOT DETECTED NOT DETECTED   Enteropathogenic E coli (EPEC) NOT DETECTED NOT DETECTED   Enterotoxigenic E coli (ETEC) NOT DETECTED NOT DETECTED   Shiga like toxin producing E coli (STEC) NOT DETECTED NOT DETECTED   Shigella/Enteroinvasive E coli (EIEC) NOT DETECTED NOT DETECTED   Cryptosporidium NOT DETECTED NOT DETECTED   Cyclospora cayetanensis NOT DETECTED NOT DETECTED   Entamoeba histolytica NOT DETECTED NOT DETECTED   Giardia lamblia NOT DETECTED NOT DETECTED   Adenovirus F40/41 NOT DETECTED NOT DETECTED   Astrovirus NOT DETECTED NOT DETECTED   Norovirus GI/GII NOT DETECTED NOT DETECTED   Rotavirus A NOT DETECTED NOT DETECTED   Sapovirus (I, II, IV, and V) NOT DETECTED NOT DETECTED  C Difficile Quick Screen w PCR reflex   Collection Time: 10/20/23 11:34 PM   Specimen: STOOL  Result Value Ref Range   C Diff antigen NEGATIVE NEGATIVE   C Diff toxin NEGATIVE NEGATIVE   C Diff interpretation No C. difficile detected.   Lactic acid, plasma   Collection Time: 10/21/23 12:03 AM  Result Value Ref Range   Lactic Acid, Venous 3.7 (HH) 0.5 - 1.9 mmol/L  HIV Antibody (routine testing w rflx)   Collection Time: 10/21/23 12:03 AM  Result Value Ref Range   HIV Screen 4th Generation wRfx Non Reactive Non Reactive  Phosphorus   Collection Time: 10/21/23  3:56 AM  Result Value Ref Range   Phosphorus 2.7 2.5 - 4.6 mg/dL  Magnesium    Collection Time:  10/21/23  3:56 AM  Result Value Ref Range   Magnesium  2.1 1.7 - 2.4 mg/dL  Basic metabolic panel   Collection Time: 10/21/23  3:56 AM  Result Value Ref Range   Sodium 135 135 - 145 mmol/L   Potassium 3.7 3.5 - 5.1 mmol/L   Chloride 105 98 - 111 mmol/L   CO2 15 (L) 22 - 32 mmol/L   Glucose, Bld 188 (H) 70 - 99 mg/dL   BUN 14 6 - 20 mg/dL   Creatinine, Ser 8.89 0.61 - 1.24 mg/dL   Calcium  9.4 8.9 - 10.3 mg/dL   GFR, Estimated >39 >39 mL/min   Anion gap 15 5 - 15  CBC   Collection Time: 10/21/23  3:56 AM  Result Value Ref Range   WBC 17.0 (H) 4.0 - 10.5 K/uL   RBC 5.12 4.22 - 5.81 MIL/uL   Hemoglobin 15.7 13.0 - 17.0 g/dL   HCT 54.3 60.9 - 47.9 %   MCV 89.1 80.0 - 100.0 fL   MCH 30.7 26.0 - 34.0 pg   MCHC 34.4 30.0 - 36.0 g/dL   RDW 87.2 88.4 - 84.4 %   Platelets 388 150 - 400 K/uL  nRBC 0.0 0.0 - 0.2 %  Urine Drug Screen, Qualitative ([facility] only)   Collection Time: 10/22/23  2:00 AM  Result Value Ref Range   Tricyclic, Ur Screen NONE DETECTED NONE DETECTED   Amphetamines, Ur Screen NONE DETECTED NONE DETECTED   MDMA (Ecstasy)Ur Screen NONE DETECTED NONE DETECTED   Cocaine Metabolite,Ur [place] NONE DETECTED NONE DETECTED   Opiate, Ur Screen NONE DETECTED NONE DETECTED   Phencyclidine (PCP) Ur S NONE DETECTED NONE DETECTED   Cannabinoid 50 Ng, Ur [place] NONE DETECTED NONE DETECTED   Barbiturates, Ur Screen POSITIVE (A) NONE DETECTED   Benzodiazepine, Ur Scrn POSITIVE (A) NONE DETECTED   Methadone  Scn, Ur POSITIVE (A) NONE DETECTED  Renal function panel   Collection Time: 10/22/23  3:04 AM  Result Value Ref Range   Sodium 136 135 - 145 mmol/L   Potassium 3.6 3.5 - 5.1 mmol/L   Chloride 107 98 - 111 mmol/L   CO2 18 (L) 22 - 32 mmol/L   Glucose, Bld 127 (H) 70 - 99 mg/dL   BUN 21 (H) 6 - 20 mg/dL   Creatinine, Ser 9.01 0.61 - 1.24 mg/dL   Calcium  9.0 8.9 - 10.3 mg/dL   Phosphorus 3.3 2.5 - 4.6 mg/dL   Albumin 3.7 3.5 - 5.0 g/dL   GFR, Estimated >39 >39 mL/min   Anion gap 11 5  - 15  Magnesium    Collection Time: 10/22/23  3:04 AM  Result Value Ref Range   Magnesium  2.5 (H) 1.7 - 2.4 mg/dL  CBC   Collection Time: 10/22/23  3:04 AM  Result Value Ref Range   WBC 15.2 (H) 4.0 - 10.5 K/uL   RBC 4.45 4.22 - 5.81 MIL/uL   Hemoglobin 13.8 13.0 - 17.0 g/dL   HCT 60.0 60.9 - 47.9 %   MCV 89.7 80.0 - 100.0 fL   MCH 31.0 26.0 - 34.0 pg   MCHC 34.6 30.0 - 36.0 g/dL   RDW 87.2 88.4 - 84.4 %   Platelets 308 150 - 400 K/uL   nRBC 0.0 0.0 - 0.2 %  Hemoglobin A1c   Collection Time: 10/22/23  3:04 AM  Result Value Ref Range   Hgb A1c MFr Bld 5.2 4.8 - 5.6 %   Mean Plasma Glucose 102.54 mg/dL  Procalcitonin   Collection Time: 10/22/23  3:04 AM  Result Value Ref Range   Procalcitonin <0.10 ng/mL  Vitamin B12   Collection Time: 10/22/23  8:25 AM  Result Value Ref Range   Vitamin B-12 230 180 - 914 pg/mL  VITAMIN D  25 Hydroxy (Vit-D Deficiency, Fractures)   Collection Time: 10/22/23  8:25 AM  Result Value Ref Range   Vit D, 25-Hydroxy 32.89 30 - 100 ng/mL  Basic metabolic panel with GFR   Collection Time: 10/23/23  3:37 AM  Result Value Ref Range   Sodium 136 135 - 145 mmol/L   Potassium 3.7 3.5 - 5.1 mmol/L   Chloride 101 98 - 111 mmol/L   CO2 27 22 - 32 mmol/L   Glucose, Bld 88 70 - 99 mg/dL   BUN 10 6 - 20 mg/dL   Creatinine, Ser 9.07 0.61 - 1.24 mg/dL   Calcium  9.1 8.9 - 10.3 mg/dL   GFR, Estimated >39 >39 mL/min   Anion gap 8 5 - 15  CBC   Collection Time: 10/23/23  3:37 AM  Result Value Ref Range   WBC 9.4 4.0 - 10.5 K/uL   RBC 4.21 (L) 4.22 - 5.81 MIL/uL  Hemoglobin 13.2 13.0 - 17.0 g/dL   HCT 61.6 (L) 60.9 - 47.9 %   MCV 91.0 80.0 - 100.0 fL   MCH 31.4 26.0 - 34.0 pg   MCHC 34.5 30.0 - 36.0 g/dL   RDW 87.3 88.4 - 84.4 %   Platelets 270 150 - 400 K/uL   nRBC 0.0 0.0 - 0.2 %  Magnesium    Collection Time: 10/23/23  3:37 AM  Result Value Ref Range   Magnesium  2.6 (H) 1.7 - 2.4 mg/dL  Phosphorus   Collection Time: 10/23/23  3:37 AM  Result  Value Ref Range   Phosphorus 3.5 2.5 - 4.6 mg/dL  Basic metabolic panel with GFR   Collection Time: 10/24/23  7:11 AM  Result Value Ref Range   Sodium 139 135 - 145 mmol/L   Potassium 3.9 3.5 - 5.1 mmol/L   Chloride 100 98 - 111 mmol/L   CO2 26 22 - 32 mmol/L   Glucose, Bld 109 (H) 70 - 99 mg/dL   BUN 9 6 - 20 mg/dL   Creatinine, Ser 9.23 0.61 - 1.24 mg/dL   Calcium  9.1 8.9 - 10.3 mg/dL   GFR, Estimated >39 >39 mL/min   Anion gap 13 5 - 15  CBC   Collection Time: 10/24/23  7:11 AM  Result Value Ref Range   WBC 5.6 4.0 - 10.5 K/uL   RBC 3.85 (L) 4.22 - 5.81 MIL/uL   Hemoglobin 11.9 (L) 13.0 - 17.0 g/dL   HCT 64.4 (L) 60.9 - 47.9 %   MCV 92.2 80.0 - 100.0 fL   MCH 30.9 26.0 - 34.0 pg   MCHC 33.5 30.0 - 36.0 g/dL   RDW 87.4 88.4 - 84.4 %   Platelets 235 150 - 400 K/uL   nRBC 0.0 0.0 - 0.2 %  Magnesium    Collection Time: 10/24/23  7:11 AM  Result Value Ref Range   Magnesium  2.3 1.7 - 2.4 mg/dL  Phosphorus   Collection Time: 10/24/23  7:11 AM  Result Value Ref Range   Phosphorus 4.5 2.5 - 4.6 mg/dL  Basic metabolic panel with GFR   Collection Time: 10/25/23  3:49 AM  Result Value Ref Range   Sodium 139 135 - 145 mmol/L   Potassium 4.6 3.5 - 5.1 mmol/L   Chloride 103 98 - 111 mmol/L   CO2 29 22 - 32 mmol/L   Glucose, Bld 106 (H) 70 - 99 mg/dL   BUN 13 6 - 20 mg/dL   Creatinine, Ser 9.17 0.61 - 1.24 mg/dL   Calcium  9.2 8.9 - 10.3 mg/dL   GFR, Estimated >39 >39 mL/min   Anion gap 7 5 - 15  CBC   Collection Time: 10/25/23  3:49 AM  Result Value Ref Range   WBC 5.7 4.0 - 10.5 K/uL   RBC 3.67 (L) 4.22 - 5.81 MIL/uL   Hemoglobin 11.2 (L) 13.0 - 17.0 g/dL   HCT 66.1 (L) 60.9 - 47.9 %   MCV 92.1 80.0 - 100.0 fL   MCH 30.5 26.0 - 34.0 pg   MCHC 33.1 30.0 - 36.0 g/dL   RDW 87.5 88.4 - 84.4 %   Platelets 247 150 - 400 K/uL   nRBC 0.0 0.0 - 0.2 %  Magnesium    Collection Time: 10/25/23  3:49 AM  Result Value Ref Range   Magnesium  2.4 1.7 - 2.4 mg/dL  Phosphorus    Collection Time: 10/25/23  3:49 AM  Result Value Ref Range   Phosphorus 5.0 (H) 2.5 -  4.6 mg/dL  Iron and TIBC   Collection Time: 10/25/23  3:49 AM  Result Value Ref Range   Iron 41 (L) 45 - 182 ug/dL   TIBC 660 749 - 549 ug/dL   Saturation Ratios 12 (L) 17.9 - 39.5 %   UIBC 298 ug/dL  Folate   Collection Time: 10/25/23  3:49 AM  Result Value Ref Range   Folate 10.8 >5.9 ng/mL      Assessment & Plan:   Problem List Items Addressed This Visit       Cardiovascular and Mediastinum   Essential hypertension   Blood pressure below normal in office today.  Will check at home . May no longer need blood pressure medication since he has stopped drinking.  Follow up in 1 month.  Call sooner if concerns arise.         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  Dose recently increased due to decreased drinking.  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  No concerns at visit today.       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  Going to meetings.  Doing well.  Congratulated on sobriety.       Anxiety   Chronic.  Ongoing concern.  Feels like his anxiety was the cause of his drinking.  Was working on medication titration before leaving rehab but wasn't able to finish it before discharge. Will increase Zoloft  to 50mg .  Discussed that this can worsen anxiety- if this happens can change to citalopram.  Trazodone increased to 100mg .  Continue with Hydroxyzine  and Buspar.  Follow up in 1 month.       Relevant Medications   traZODone (DESYREL) 100 MG tablet   sertraline  (ZOLOFT ) 50 MG tablet   hydrOXYzine  (ATARAX ) 50 MG tablet   busPIRone (BUSPAR) 10 MG tablet     Follow up plan: Return in about 1 month (around 01/27/2024) for Depression/Anxiety FU, BP Check.

## 2023-12-28 NOTE — Assessment & Plan Note (Signed)
 Blood pressure below normal in office today.  Will check at home . May no longer need blood pressure medication since he has stopped drinking.  Follow up in 1 month.  Call sooner if concerns arise.

## 2023-12-28 NOTE — Assessment & Plan Note (Signed)
 Chronic.  Has been on Methadone  for 5 years.  Dose recently increased due to decreased drinking.  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  No concerns at visit today.

## 2023-12-28 NOTE — Assessment & Plan Note (Signed)
 Chronic.  Ongoing concern.  Feels like his anxiety was the cause of his drinking.  Was working on medication titration before leaving rehab but wasn't able to finish it before discharge. Will increase Zoloft  to 50mg .  Discussed that this can worsen anxiety- if this happens can change to citalopram.  Trazodone increased to 100mg .  Continue with Hydroxyzine  and Buspar.  Follow up in 1 month.

## 2023-12-28 NOTE — Assessment & Plan Note (Signed)
 Hs 56 days sober.  Going to meetings.  Doing well.  Congratulated on sobriety.

## 2024-01-16 ENCOUNTER — Other Ambulatory Visit: Payer: Self-pay

## 2024-01-18 ENCOUNTER — Other Ambulatory Visit: Payer: Self-pay | Admitting: Nurse Practitioner

## 2024-01-19 ENCOUNTER — Other Ambulatory Visit: Payer: Self-pay | Admitting: Nurse Practitioner

## 2024-01-20 NOTE — Telephone Encounter (Signed)
 Requested Prescriptions  Pending Prescriptions Disp Refills   hydrOXYzine  (ATARAX ) 50 MG tablet [Pharmacy Med Name: HYDROXYZINE  HCL 50 MG TABLET] 90 tablet 0    Sig: TAKE 1 TABLET BY MOUTH EVERY 6 HOURS AS NEEDED.     Ear, Nose, and Throat:  Antihistamines 2 Passed - 01/20/2024  9:47 PM      Passed - Cr in normal range and within 360 days    Creatinine  Date Value Ref Range Status  06/13/2014 0.73 mg/dL Final    Comment:    9.38-8.75 NOTE: New Reference Range  04/22/14    Creatinine, Ser  Date Value Ref Range Status  10/25/2023 0.82 0.61 - 1.24 mg/dL Final         Passed - Valid encounter within last 12 months    Recent Outpatient Visits           3 weeks ago Alcohol abuse with withdrawal and perceptual disturbance [facility])   [facility] Melvin Pao, NP

## 2024-01-22 NOTE — Telephone Encounter (Signed)
 Patient has been called and a message left for them to return the call to the office. Ok for E2C2 to review if/when they return the call. Please do not transfer to CAL rather send a CRM if needed only.  Attempted to reach patient but was not able. Please ask if he has enough of the listed medication to make it till the appointment on 01/30/2024.

## 2024-01-22 NOTE — Telephone Encounter (Signed)
 Requested medication (s) are due for refill today:Yes  Requested medication (s) are on the active medication list:Yes  Last refill:  12/28/2023  Future visit scheduled: Yes  Notes to clinic:  01/30/2024 follow up scheduled    Changes Requested   Name from pharmacy: SERTRALINE  HCL 50 MG TABLET       Will file in chart as: sertraline  (ZOLOFT ) 50 MG tablet   Sig: TAKE 1 TABLET BY MOUTH EVERY DAY   Disp: 90 tablet    Refills: 1   Start: 01/19/2024   Class: Normal   Non-formulary   Last ordered: 3 weeks ago (12/28/2023) by Darice Petty, NP   Last refill: 12/28/2023   Rx #: [id number]   Pharmacy comment: REQUEST FOR 90 DAYS PRESCRIPTION.   Psychiatry:  Antidepressants - SSRI - sertraline  Passed12/06/2023 01:31 PM  Protocol Details AST in normal range and within 360 days   ALT in normal range and within 360 days   Completed PHQ-2 or PHQ-9 in the last 360 days   Valid encounter within last 6 months    This request has changes from the previous prescription.   Name from pharmacy: BUSPIRONE  HCL 10 MG TABLET       Will file in chart as: busPIRone  (BUSPAR ) 10 MG tablet   Sig: TAKE 2 TABLETS BY MOUTH 3 TIMES DAILY.   Disp: 540 tablet    Refills: 1   Start: 01/19/2024   Class: Normal   Non-formulary   Last ordered: 3 weeks ago (12/28/2023) by Darice Petty, NP   Last refill: 12/28/2023   Rx #: [id number]   Pharmacy comment: REQUEST FOR 90 DAYS PRESCRIPTION.   Psychiatry: Anxiolytics/Hypnotics - Non-controlled Passed12/06/2023 01:31 PM  Protocol Details Valid encounter within last 12 months    This request has changes from the previous prescription.  To be filled at: [id number] - [place], [place] - [address]

## 2024-01-22 NOTE — Telephone Encounter (Unsigned)
 Copied from CRM #[id number]. Topic: Clinical - Medication Question >> Jan 22, 2024  5:31 PM Shanda MATSU wrote: Reason for CRM: Patient returning call made to him regards to meds, Sertraline  HCl 50 mg Oral and busPIRone  HCl 10 MG, patient stated he will not have enough of these meds to last him until his appt scheduled for 01/30/2024. Patient's preferred pharmacy is  [id number] - [place], [place] - [address] [place] [address] Phone: [phone] Fax: [phone]

## 2024-01-30 ENCOUNTER — Ambulatory Visit: Payer: MEDICAID | Admitting: Nurse Practitioner

## 2024-02-18 ENCOUNTER — Other Ambulatory Visit: Payer: Self-pay | Admitting: Nurse Practitioner

## 2024-02-20 NOTE — Telephone Encounter (Signed)
 Requested Prescriptions  Pending Prescriptions Disp Refills   traZODone  (DESYREL ) 100 MG tablet [Pharmacy Med Name: TRAZODONE  100 MG TABLET] 45 tablet 1    Sig: TAKE 0.5 TABLETS (50 MG TOTAL) BY MOUTH AT BEDTIME.     Psychiatry: Antidepressants - Serotonin Modulator Passed - 02/20/2024 11:47 AM      Passed - Valid encounter within last 6 months    Recent Outpatient Visits           1 month ago Alcohol abuse with withdrawal and perceptual disturbance [facility])   [place] [facility] Melvin Pao, NP

## 2024-02-28 ENCOUNTER — Ambulatory Visit: Payer: MEDICAID | Admitting: Nurse Practitioner

## 2024-03-17 ENCOUNTER — Other Ambulatory Visit: Payer: Self-pay | Admitting: Nurse Practitioner

## 2024-03-29 ENCOUNTER — Ambulatory Visit: Payer: MEDICAID | Admitting: Nurse Practitioner
